# Patient Record
Sex: Female | Born: 1990
Health system: Southern US, Community
[De-identification: ages and names within clinical notes are randomized; demographics above are authoritative.]

## PROBLEM LIST (undated history)

## (undated) DIAGNOSIS — G43909 Migraine, unspecified, not intractable, without status migrainosus: Secondary | ICD-10-CM

## (undated) DIAGNOSIS — J45990 Exercise induced bronchospasm: Secondary | ICD-10-CM

## (undated) DIAGNOSIS — F32A Depression, unspecified: Secondary | ICD-10-CM

## (undated) DIAGNOSIS — E282 Polycystic ovarian syndrome: Secondary | ICD-10-CM

## (undated) DIAGNOSIS — F411 Generalized anxiety disorder: Secondary | ICD-10-CM

## (undated) DIAGNOSIS — F329 Major depressive disorder, single episode, unspecified: Secondary | ICD-10-CM

## (undated) DIAGNOSIS — E785 Hyperlipidemia, unspecified: Secondary | ICD-10-CM

## (undated) DIAGNOSIS — J309 Allergic rhinitis, unspecified: Secondary | ICD-10-CM

## (undated) DIAGNOSIS — F909 Attention-deficit hyperactivity disorder, unspecified type: Secondary | ICD-10-CM

## (undated) HISTORY — DX: Major depressive disorder, single episode, unspecified: F32.9

## (undated) HISTORY — DX: Exercise induced bronchospasm: J45.990

## (undated) HISTORY — DX: Polycystic ovarian syndrome: E28.2

## (undated) HISTORY — DX: Allergic rhinitis, unspecified: J30.9

## (undated) HISTORY — DX: Migraine, unspecified, not intractable, without status migrainosus: G43.909

## (undated) HISTORY — DX: Generalized anxiety disorder: F41.1

## (undated) HISTORY — DX: Depression, unspecified: F32.A

## (undated) HISTORY — DX: Hyperlipidemia, unspecified: E78.5

---
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
## 2009-04-04 HISTORY — PX: WISDOM TOOTH EXTRACTION: SHX21

## 2012-11-02 LAB — HM PAP SMEAR

## 2012-12-10 ENCOUNTER — Emergency Department (HOSPITAL_BASED_OUTPATIENT_CLINIC_OR_DEPARTMENT_OTHER)
Admission: EM | Admit: 2012-12-10 | Discharge: 2012-12-10 | Disposition: A | Discharge status: Home / Self Care | Payer: BC Managed Care – PPO | Attending: Emergency Medicine | Admitting: Emergency Medicine

## 2012-12-10 ENCOUNTER — Encounter (HOSPITAL_BASED_OUTPATIENT_CLINIC_OR_DEPARTMENT_OTHER): Payer: Self-pay

## 2012-12-10 DIAGNOSIS — W1809XA Striking against other object with subsequent fall, initial encounter: Secondary | ICD-10-CM | POA: Insufficient documentation

## 2012-12-10 DIAGNOSIS — IMO0002 Reserved for concepts with insufficient information to code with codable children: Secondary | ICD-10-CM

## 2012-12-10 DIAGNOSIS — Y9389 Activity, other specified: Secondary | ICD-10-CM | POA: Insufficient documentation

## 2012-12-10 DIAGNOSIS — S71109A Unspecified open wound, unspecified thigh, initial encounter: Secondary | ICD-10-CM | POA: Insufficient documentation

## 2012-12-10 DIAGNOSIS — S71009A Unspecified open wound, unspecified hip, initial encounter: Secondary | ICD-10-CM | POA: Insufficient documentation

## 2012-12-10 DIAGNOSIS — Z79899 Other long term (current) drug therapy: Secondary | ICD-10-CM | POA: Insufficient documentation

## 2012-12-10 DIAGNOSIS — S31809A Unspecified open wound of unspecified buttock, initial encounter: Secondary | ICD-10-CM | POA: Insufficient documentation

## 2012-12-10 DIAGNOSIS — Y9289 Other specified places as the place of occurrence of the external cause: Secondary | ICD-10-CM | POA: Insufficient documentation

## 2012-12-10 NOTE — ED Notes (Signed)
PA at bedside repairing wounds.

## 2012-12-10 NOTE — ED Notes (Signed)
Was sitting on glass coffee table that broke-reports multiple lacs

## 2012-12-10 NOTE — ED Provider Notes (Signed)
CSN: [id number]     Arrival date & time 12/10/12  2133 History   First MD Initiated Contact with Patient 12/10/12 2151     Chief Complaint  Patient presents with  . Extremity Laceration   (Consider location/radiation/quality/duration/timing/severity/associated sxs/prior Treatment) HPI Comments: Patient presents emergency department with chief complaint of fall through glass coffee table. She is complaining of several lacerations to her upper left thigh. He states that the pain is moderate. Bleeding is controlled. Her tetanus is up-to-date. She has not tried taking anything to alleviate her symptoms. Denies any difficulty moving her legs or ambulating.  The history is provided by the patient. No language interpreter was used.    History reviewed. No pertinent past medical history. History reviewed. No pertinent past surgical history. No family history on file. History  Substance Use Topics  . Smoking status: Never Smoker   . Smokeless tobacco: Not on file  . Alcohol Use: No   OB History   Grav Para Term Preterm Abortions TAB SAB Ect Mult Living                 Review of Systems  All other systems reviewed and are negative.    Allergies  Review of patient's allergies indicates no known allergies.  Home Medications   Current Outpatient Rx  Name  Route  Sig  Dispense  Refill  . LamoTRIgine (LAMICTAL PO)   Oral   Take by mouth.         . Sertraline HCl (ZOLOFT PO)   Oral   Take by mouth.          BP 130/82  Pulse 66  Temp(Src) 98.7 F (37.1 C) (Oral)  Resp 16  Ht 5\' 10"  (1.778 m)  Wt 185 lb (83.915 kg)  BMI 26.54 kg/m2  SpO2 99%  LMP 11/26/2012 Physical Exam  Nursing note and vitals reviewed. Constitutional: She is oriented to person, place, and time. She appears well-developed and well-nourished.  HENT:  Head: Normocephalic and atraumatic.  Eyes: Conjunctivae and EOM are normal. Pupils are equal, round, and reactive to light.  Neck: Normal range of  motion. Neck supple.  Cardiovascular: Normal rate and regular rhythm.  Exam reveals no gallop and no friction rub.   No murmur heard. Pulmonary/Chest: Effort normal and breath sounds normal. No respiratory distress. She has no wheezes. She has no rales. She exhibits no tenderness.  Abdominal: Soft. Bowel sounds are normal. She exhibits no distension and no mass. There is no tenderness. There is no rebound and no guarding.  Musculoskeletal: Normal range of motion. She exhibits no edema and no tenderness.  Neurological: She is alert and oriented to person, place, and time.  Skin: Skin is warm and dry.  Several scratches on the posterior bilateral thighs, as well as a scratch on the right lateral breast, (3) 1 cm lacerations to the posterior left thigh  Psychiatric: She has a normal mood and affect. Her behavior is normal. Judgment and thought content normal.    ED Course  Procedures (including critical care time) Labs Review Labs Reviewed - No data to display Imaging Review No results found. LACERATION REPAIR Performed by: Roxy Horseman Authorized by: Roxy Horseman Consent: Verbal consent obtained. Risks and benefits: risks, benefits and alternatives were discussed Consent given by: patient Patient identity confirmed: provided demographic data Prepped and Draped in normal sterile fashion Wound explored  Laceration Location: Posterior left thigh  Laceration Length: 1 cm  No Foreign Bodies seen or palpated  Anesthesia:  local infiltration  Local anesthetic: lidocaine 2 % with epinephrine  Anesthetic total: 3 ml  Irrigation method: syringe Amount of cleaning: standard  Skin closure: 4-0 Prolene   Number of sutures: 2   Technique: Simple   Patient tolerance: Patient tolerated the procedure well with no immediate complications.  LACERATION REPAIR Performed by: Roxy Horseman Authorized by: Roxy Horseman Consent: Verbal consent obtained. Risks and benefits:  risks, benefits and alternatives were discussed Consent given by: patient Patient identity confirmed: provided demographic data Prepped and Draped in normal sterile fashion Wound explored  Laceration Location: Posterior left thigh  Laceration Length: 1 cm  No Foreign Bodies seen or palpated  Anesthesia: local infiltration  Local anesthetic: lidocaine 2 % with epinephrine  Anesthetic total: 3 ml  Irrigation method: syringe Amount of cleaning: standard  Skin closure: 4-0 Prolene   Number of sutures: 2   Technique: Simple   Patient tolerance: Patient tolerated the procedure well with no immediate complications.  LACERATION REPAIR Performed by: Roxy Horseman Authorized by: Roxy Horseman Consent: Verbal consent obtained. Risks and benefits: risks, benefits and alternatives were discussed Consent given by: patient Patient identity confirmed: provided demographic data Prepped and Draped in normal sterile fashion Wound explored  Laceration Location: Posterior left buttock  Laceration Length: 1 cm  No Foreign Bodies seen or palpated  Anesthesia: local infiltration  Local anesthetic: lidocaine 2 % with epinephrine  Anesthetic total: 3 ml  Irrigation method: syringe Amount of cleaning: standard  Skin closure: 4-0 Prolene   Number of sutures: 2   Technique: Simple   Patient tolerance: Patient tolerated the procedure well with no immediate complications.  MDM   1. Laceration    Patient fell through a glass coffee table that she was sitting on. She had multiple small lacerations that were repaired in the ED. Bleeding is controlled. Tetanus is up-to-date. Foreign bodies were removed. No obvious foreign bodies remained. Followup and return precautions have been given. Patient is stable and ready for discharge.    Roxy Horseman, PA-C 12/10/12 2254

## 2012-12-10 NOTE — ED Provider Notes (Signed)
  Medical screening examination/treatment/procedure(s) were performed by non-physician practitioner and as supervising physician I was immediately available for consultation/collaboration.    Aliviyah Malanga, MD 12/10/12 2319 

## 2012-12-10 NOTE — ED Notes (Signed)
Wound care provided, wounds dressed.

## 2012-12-18 ENCOUNTER — Telehealth: Payer: Self-pay | Admitting: Internal Medicine

## 2012-12-18 NOTE — Telephone Encounter (Signed)
sure

## 2012-12-18 NOTE — Telephone Encounter (Signed)
Pt is schedule to be Dr. Lescher new pt in 05/20/13. Pt request to be work in before 01/12/13 due to insurance will be terminate. Please advise ° °

## 2012-12-18 NOTE — Telephone Encounter (Signed)
Pt called stated that she fell and have 6 stitches put on her leg(different places) last Monday 12/10/12, pt was advise to get it remove 9-10 days. Pt is schedule for new pt with Dr. Felicity Coyer in February 2015. Pt request to be work in to get this stitches remove before new pt. Inform pt that Dr. Felicity Coyer is not in the office this week, not sure if other doctor feel comfortable to do this. Please advise.

## 2012-12-21 ENCOUNTER — Ambulatory Visit (INDEPENDENT_AMBULATORY_CARE_PROVIDER_SITE_OTHER): Payer: BC Managed Care – PPO | Admitting: Internal Medicine

## 2012-12-21 ENCOUNTER — Encounter: Payer: Self-pay | Admitting: Internal Medicine

## 2012-12-21 VITALS — BP 118/70 | HR 80 | Temp 98.4°F | Resp 12 | Wt 185.0 lb

## 2012-12-21 DIAGNOSIS — Z5189 Encounter for other specified aftercare: Secondary | ICD-10-CM

## 2012-12-21 DIAGNOSIS — T148XXD Other injury of unspecified body region, subsequent encounter: Secondary | ICD-10-CM | POA: Insufficient documentation

## 2012-12-21 DIAGNOSIS — IMO0002 Reserved for concepts with insufficient information to code with codable children: Secondary | ICD-10-CM | POA: Insufficient documentation

## 2012-12-21 NOTE — Progress Notes (Signed)
  Subjective:    Patient ID: Felicia Young, female    DOB: 1991-03-20, 22 y.o.   MRN: [id number]  Wound Check She was originally treated 10 to 14 days ago. Previous treatment included laceration repair. Her temperature was unmeasured prior to arrival. There has been no drainage from the wound. There is no redness present. There is no swelling present. The pain has no pain. She has no difficulty moving the affected extremity or digit.      Review of Systems  All other systems reviewed and are negative.       Objective:   Physical Exam  Skin:             Assessment & Plan:

## 2012-12-21 NOTE — Telephone Encounter (Signed)
OV 9/19 noted - thanks Dr Yetta Barre!

## 2012-12-21 NOTE — Patient Instructions (Signed)
Wound Care Wound care helps prevent pain and infection.  You may need a tetanus shot if:  You cannot remember when you had your last tetanus shot.  You have never had a tetanus shot.  The injury broke your skin. If you need a tetanus shot and you choose not to have one, you may get tetanus. Sickness from tetanus can be serious. HOME CARE   Only take medicine as told by your doctor.  Clean the wound daily with mild soap and water.  Change any bandages (dressings) as told by your doctor.  Put medicated cream and a bandage on the wound as told by your doctor.  Change the bandage if it gets wet, dirty, or starts to smell.  Take showers. Do not take baths, swim, or do anything that puts your wound under water.  Rest and raise (elevate) the wound until the pain and puffiness (swelling) are better.  Keep all doctor visits as told. GET HELP RIGHT AWAY IF:   Yellowish-white fluid (pus) comes from the wound.  Medicine does not lessen your pain.  There is a red streak going away from the wound.  You have a fever. MAKE SURE YOU:   Understand these instructions.  Will watch your condition.  Will get help right away if you are not doing well or get worse. Document Released: 12/29/2007 Document Revised: 06/13/2011 Document Reviewed: 07/25/2010 [employer] Patient Information 2014 [employer].  

## 2012-12-24 ENCOUNTER — Telehealth: Payer: Self-pay | Admitting: Internal Medicine

## 2012-12-24 NOTE — Telephone Encounter (Signed)
Pt is schedule to be Dr. Annye Rusk new pt in 05/20/13. Pt request to be work in before 01/12/13 due to insurance will be terminate. Please advise

## 2012-12-24 NOTE — Telephone Encounter (Signed)
Ok to work in- thanks

## 2012-12-24 NOTE — Telephone Encounter (Signed)
Pt has an appt 01/10/13. Thank you

## 2013-01-10 ENCOUNTER — Other Ambulatory Visit (INDEPENDENT_AMBULATORY_CARE_PROVIDER_SITE_OTHER): Payer: BC Managed Care – PPO

## 2013-01-10 ENCOUNTER — Encounter: Payer: Self-pay | Admitting: Internal Medicine

## 2013-01-10 ENCOUNTER — Ambulatory Visit (INDEPENDENT_AMBULATORY_CARE_PROVIDER_SITE_OTHER): Payer: BC Managed Care – PPO | Admitting: Internal Medicine

## 2013-01-10 VITALS — BP 118/78 | HR 86 | Temp 98.2°F | Ht 70.0 in | Wt 189.8 lb

## 2013-01-10 DIAGNOSIS — Z0000 Encounter for general adult medical examination without abnormal findings: Secondary | ICD-10-CM

## 2013-01-10 DIAGNOSIS — R7989 Other specified abnormal findings of blood chemistry: Secondary | ICD-10-CM

## 2013-01-10 DIAGNOSIS — G43909 Migraine, unspecified, not intractable, without status migrainosus: Secondary | ICD-10-CM | POA: Insufficient documentation

## 2013-01-10 DIAGNOSIS — J45990 Exercise induced bronchospasm: Secondary | ICD-10-CM | POA: Insufficient documentation

## 2013-01-10 LAB — URINALYSIS, ROUTINE W REFLEX MICROSCOPIC
Ketones, ur: NEGATIVE
Leukocytes, UA: NEGATIVE
Nitrite: NEGATIVE
RBC / HPF: NONE SEEN (ref 0–?)
Specific Gravity, Urine: 1.01 (ref 1.000–1.030)
pH: 7 (ref 5.0–8.0)

## 2013-01-10 LAB — LIPID PANEL
Cholesterol: 248 mg/dL — ABNORMAL HIGH (ref 0–200)
HDL: 49.9 mg/dL (ref >39.00)
Triglycerides: 272 mg/dL — ABNORMAL HIGH (ref 0.0–149.0)
VLDL: 54.4 mg/dL — ABNORMAL HIGH (ref 0.0–40.0)

## 2013-01-10 LAB — BASIC METABOLIC PANEL
Calcium: 9.8 mg/dL (ref 8.4–10.5)
GFR: 131.8 mL/min (ref >60.00)
Potassium: 4.6 mEq/L (ref 3.5–5.1)
Sodium: 141 mEq/L (ref 135–145)

## 2013-01-10 LAB — HEPATIC FUNCTION PANEL
ALT: 25 U/L (ref 0–35)
Alkaline Phosphatase: 50 U/L (ref 39–117)
Total Bilirubin: 0.8 mg/dL (ref 0.3–1.2)
Total Protein: 7.8 g/dL (ref 6.0–8.3)

## 2013-01-10 LAB — CBC WITH DIFFERENTIAL/PLATELET
Eosinophils Absolute: 0 10*3/uL (ref 0.0–0.7)
Eosinophils Relative: 0.5 % (ref 0.0–5.0)
HCT: 40.8 % (ref 36.0–46.0)
Hemoglobin: 13.9 g/dL (ref 12.0–15.0)
Lymphocytes Relative: 33.3 % (ref 12.0–46.0)
Lymphs Abs: 2.2 10*3/uL (ref 0.7–4.0)
MCHC: 34.1 g/dL (ref 30.0–36.0)
Monocytes Relative: 9.1 % (ref 3.0–12.0)
Neutrophils Relative %: 56.8 % (ref 43.0–77.0)
Platelets: 262 10*3/uL (ref 150.0–400.0)
WBC: 6.8 10*3/uL (ref 4.5–10.5)

## 2013-01-10 LAB — TSH: TSH: 2.88 u[IU]/mL (ref 0.35–5.50)

## 2013-01-10 MED ORDER — ALBUTEROL SULFATE HFA 108 (90 BASE) MCG/ACT IN AERS: 2.0000 | INHALATION_SPRAY | Freq: Four times a day (QID) | RESPIRATORY_TRACT | Status: DC | PRN

## 2013-01-10 NOTE — Progress Notes (Signed)
  Subjective:    Patient ID: Felicia Young, female    DOB: [birth year], 22 y.o.   MRN: [id number]  HPI  patient is here today for annual physical. Patient feels well overall   Past Medical History  Diagnosis Date  . Generalized anxiety disorder   . ALLERGIC RHINITIS     seasonal  . Exercise-induced asthma    Family History  Problem Relation Age of Onset  . Arthritis Mother   . Arthritis Father   . Alcohol abuse Other   . Hyperlipidemia Other   . Hypertension Other   . Stroke Other    History  Substance Use Topics  . Smoking status: Never Smoker   . Smokeless tobacco: Not on file  . Alcohol Use: No    Review of Systems  Constitutional: Negative for fever or weight change.  Respiratory: Negative for cough and shortness of breath.   Cardiovascular: Negative for chest pain or palpitations.  Gastrointestinal: Negative for abdominal pain, no bowel changes.  Musculoskeletal: Negative for gait problem or joint swelling.  Skin: Negative for rash.  Neurological: Negative for dizziness or headache.  No other specific complaints in a complete review of systems (except as listed in HPI above).     Objective:   Physical Exam BP 118/78  Pulse 86  Temp(Src) 98.2 F (36.8 C) (Oral)  Ht 5\' 10"  (1.778 m)  Wt 189 lb 12.8 oz (86.093 kg)  BMI 27.23 kg/m2  SpO2 97% Wt Readings from Last 3 Encounters:  01/10/13 189 lb 12.8 oz (86.093 kg)  12/21/12 185 lb (83.915 kg)  12/10/12 185 lb (83.915 kg)   Constitutional: She is overweight, but appears well-developed and well-nourished. No distress.  HENT: Head: Normocephalic and atraumatic. Ears: B TMs ok, no erythema or effusion; Nose: Nose normal. Mouth/Throat: Oropharynx is clear and moist. No oropharyngeal exudate.  Eyes: Conjunctivae and EOM are normal. Pupils are equal, round, and reactive to light. No scleral icterus.  Neck: Normal range of motion. Neck supple. No JVD present. No thyromegaly present.  Cardiovascular: Normal rate,  regular rhythm and normal heart sounds.  No murmur heard. No BLE edema. Pulmonary/Chest: Effort normal and breath sounds normal. No respiratory distress. She has no wheezes.  Abdominal: Soft. Bowel sounds are normal. She exhibits no distension. There is no tenderness. no masses Musculoskeletal: Normal range of motion, no joint effusions. No gross deformities Neurological: She is alert and oriented to person, place, and time. No cranial nerve deficit. Coordination, balance, strength, speech and gait are normal.  Skin: Various tattoos - but skin is warm and dry. No rash noted. No erythema.  Psychiatric: She has a normal mood and affect. Her behavior is normal. Judgment and thought content normal.   Lab Results  Component Value Date   WBC 9.4 07/30/2008   HGB 14.0 07/30/2008   HCT 41.1 07/30/2008   PLT 175.0 07/30/2008   GLUCOSE 82 07/30/2008   ALT 18 07/30/2008   AST 18 07/30/2008   NA 140 07/30/2008   K 5.0 07/30/2008   CL 103 07/30/2008   CREATININE 0.6 07/30/2008   BUN 10 07/30/2008   CO2 30 07/30/2008   TSH 2.30 07/30/2008       Assessment & Plan:   CPX/v70.0 - Patient has been counseled on age-appropriate routine health concerns for screening and prevention. These are reviewed and up-to-date. Immunizations are up-to-date or declined. Labs ordered and reviewed.

## 2013-01-10 NOTE — Patient Instructions (Addendum)
It was good to see you today.  We have reviewed your prior records including labs and tests today  Health Maintenance reviewed - all recommended immunizations and age-appropriate screenings are up-to-date.  It is recommended to get a flu shot every fall, especially with your history of asthma  Medications reviewed and updated, resume albuterol rescue inhaler as needed for exercise-induced asthma -no other changes recommended  Your prescription(s) have been submitted to your pharmacy. Please take as directed and contact our office if you believe you are having problem(s) with the medication(s).  Test(s) ordered today. Your results will be released to MyChart (or called to you) after review, usually within 72hours after test completion. If any changes need to be made, you will be notified at that same time.  Please schedule followup in 12 months for annual medical physical, call sooner if problems.  Health Maintenance, Females A healthy lifestyle and preventative care can promote health and wellness.  Maintain regular health, dental, and eye exams.  Eat a healthy diet. Foods like vegetables, fruits, whole grains, low-fat dairy products, and lean protein foods contain the nutrients you need without too many calories. Decrease your intake of foods high in solid fats, added sugars, and salt. Get information about a proper diet from your caregiver, if necessary.  Regular physical exercise is one of the most important things you can do for your health. Most adults should get at least 150 minutes of moderate-intensity exercise (any activity that increases your heart rate and causes you to sweat) each week. In addition, most adults need muscle-strengthening exercises on 2 or more days a week.   Maintain a healthy weight. The body mass index (BMI) is a screening tool to identify possible weight problems. It provides an estimate of body fat based on height and weight. Your caregiver can help determine  your BMI, and can help you achieve or maintain a healthy weight. For adults 20 years and older:  A BMI below 18.5 is considered underweight.  A BMI of 18.5 to 24.9 is normal.  A BMI of 25 to 29.9 is considered overweight.  A BMI of 30 and above is considered obese.  Maintain normal blood lipids and cholesterol by exercising and minimizing your intake of saturated fat. Eat a balanced diet with plenty of fruits and vegetables. Blood tests for lipids and cholesterol should begin at age 39 and be repeated every 5 years. If your lipid or cholesterol levels are high, you are over 50, or you are a high risk for heart disease, you may need your cholesterol levels checked more frequently.Ongoing high lipid and cholesterol levels should be treated with medicines if diet and exercise are not effective.  If you smoke, find out from your caregiver how to quit. If you do not use tobacco, do not start.  If you are pregnant, do not drink alcohol. If you are breastfeeding, be very cautious about drinking alcohol. If you are not pregnant and choose to drink alcohol, do not exceed 1 drink per day. One drink is considered to be 12 ounces (355 mL) of beer, 5 ounces (148 mL) of wine, or 1.5 ounces (44 mL) of liquor.  Avoid use of street drugs. Do not share needles with anyone. Ask for help if you need support or instructions about stopping the use of drugs.  High blood pressure causes heart disease and increases the risk of stroke. Blood pressure should be checked at least every 1 to 2 years. Ongoing high blood pressure should be  treated with medicines, if weight loss and exercise are not effective.  If you are 58 to 23 years old, ask your caregiver if you should take aspirin to prevent strokes.  Diabetes screening involves taking a blood sample to check your fasting blood sugar level. This should be done once every 3 years, after age 61, if you are within normal weight and without risk factors for diabetes.  Testing should be considered at a younger age or be carried out more frequently if you are overweight and have at least 1 risk factor for diabetes.  Breast cancer screening is essential preventative care for women. You should practice "breast self-awareness." This means understanding the normal appearance and feel of your breasts and may include breast self-examination. Any changes detected, no matter how small, should be reported to a caregiver. Women in their 96s and 30s should have a clinical breast exam (CBE) by a caregiver as part of a regular health exam every 1 to 3 years. After age 44, women should have a CBE every year. Starting at age 53, women should consider having a mammogram (breast X-ray) every year. Women who have a family history of breast cancer should talk to their caregiver about genetic screening. Women at a high risk of breast cancer should talk to their caregiver about having an MRI and a mammogram every year.  The Pap test is a screening test for cervical cancer. Women should have a Pap test starting at age 68. Between ages 57 and 60, Pap tests should be repeated every 2 years. Beginning at age 63, you should have a Pap test every 3 years as long as the past 3 Pap tests have been normal. If you had a hysterectomy for a problem that was not cancer or a condition that could lead to cancer, then you no longer need Pap tests. If you are between ages 37 and 73, and you have had normal Pap tests going back 10 years, you no longer need Pap tests. If you have had past treatment for cervical cancer or a condition that could lead to cancer, you need Pap tests and screening for cancer for at least 20 years after your treatment. If Pap tests have been discontinued, risk factors (such as a new sexual partner) need to be reassessed to determine if screening should be resumed. Some women have medical problems that increase the chance of getting cervical cancer. In these cases, your caregiver may  recommend more frequent screening and Pap tests.  The human papillomavirus (HPV) test is an additional test that may be used for cervical cancer screening. The HPV test looks for the virus that can cause the cell changes on the cervix. The cells collected during the Pap test can be tested for HPV. The HPV test could be used to screen women aged 49 years and older, and should be used in women of any age who have unclear Pap test results. After the age of 52, women should have HPV testing at the same frequency as a Pap test.  Colorectal cancer can be detected and often prevented. Most routine colorectal cancer screening begins at the age of 57 and continues through age 85. However, your caregiver may recommend screening at an earlier age if you have risk factors for colon cancer. On a yearly basis, your caregiver may provide home test kits to check for hidden blood in the stool. Use of a small camera at the end of a tube, to directly examine the colon (sigmoidoscopy or  colonoscopy), can detect the earliest forms of colorectal cancer. Talk to your caregiver about this at age 83, when routine screening begins. Direct examination of the colon should be repeated every 5 to 10 years through age 64, unless early forms of pre-cancerous polyps or small growths are found.  Hepatitis C blood testing is recommended for all people born from [birth year] through 1966 and any individual with known risks for hepatitis C.  Practice safe sex. Use condoms and avoid high-risk sexual practices to reduce the spread of sexually transmitted infections (STIs). Sexually active women aged 17 and younger should be checked for Chlamydia, which is a common sexually transmitted infection. Older women with new or multiple partners should also be tested for Chlamydia. Testing for other STIs is recommended if you are sexually active and at increased risk.  Osteoporosis is a disease in which the bones lose minerals and strength with aging. This can  result in serious bone fractures. The risk of osteoporosis can be identified using a bone density scan. Women ages 84 and over and women at risk for fractures or osteoporosis should discuss screening with their caregivers. Ask your caregiver whether you should be taking a calcium supplement or vitamin D to reduce the rate of osteoporosis.  Menopause can be associated with physical symptoms and risks. Hormone replacement therapy is available to decrease symptoms and risks. You should talk to your caregiver about whether hormone replacement therapy is right for you.  Use sunscreen with a sun protection factor (SPF) of 30 or greater. Apply sunscreen liberally and repeatedly throughout the day. You should seek shade when your shadow is shorter than you. Protect yourself by wearing long sleeves, pants, a wide-brimmed hat, and sunglasses year round, whenever you are outdoors.  Notify your caregiver of new moles or changes in moles, especially if there is a change in shape or color. Also notify your caregiver if a mole is larger than the size of a pencil eraser.  Stay current with your immunizations. Document Released: 10/04/2010 Document Revised: 06/13/2011 Document Reviewed: 10/04/2010 [facility] Patient Information 2014 [place], [place].

## 2013-01-11 ENCOUNTER — Encounter: Payer: Self-pay | Admitting: Internal Medicine

## 2013-01-11 DIAGNOSIS — E785 Hyperlipidemia, unspecified: Secondary | ICD-10-CM

## 2013-01-11 HISTORY — DX: Hyperlipidemia, unspecified: E78.5

## 2013-03-11 LAB — OB RESULTS CONSOLE RPR: RPR: NONREACTIVE

## 2013-03-11 LAB — OB RESULTS CONSOLE ANTIBODY SCREEN: Antibody Screen: NEGATIVE

## 2013-03-11 LAB — OB RESULTS CONSOLE GC/CHLAMYDIA
Chlamydia: NEGATIVE
Gonorrhea: NEGATIVE

## 2013-03-11 LAB — OB RESULTS CONSOLE HEPATITIS B SURFACE ANTIGEN: HEP B S AG: NEGATIVE

## 2013-03-11 LAB — OB RESULTS CONSOLE ABO/RH: RH TYPE: POSITIVE

## 2013-03-11 LAB — OB RESULTS CONSOLE HIV ANTIBODY (ROUTINE TESTING): HIV: NONREACTIVE

## 2013-03-11 LAB — OB RESULTS CONSOLE RUBELLA ANTIBODY, IGM: Rubella: IMMUNE

## 2013-04-14 ENCOUNTER — Emergency Department (HOSPITAL_COMMUNITY)
Admission: EM | Admit: 2013-04-14 | Discharge: 2013-04-14 | Disposition: A | Discharge status: Home / Self Care | Payer: Medicaid Other | Attending: Emergency Medicine | Admitting: Emergency Medicine

## 2013-04-14 ENCOUNTER — Encounter (HOSPITAL_COMMUNITY): Payer: Self-pay | Admitting: Emergency Medicine

## 2013-04-14 DIAGNOSIS — O9989 Other specified diseases and conditions complicating pregnancy, childbirth and the puerperium: Secondary | ICD-10-CM | POA: Insufficient documentation

## 2013-04-14 DIAGNOSIS — Z8639 Personal history of other endocrine, nutritional and metabolic disease: Secondary | ICD-10-CM | POA: Insufficient documentation

## 2013-04-14 DIAGNOSIS — Z79899 Other long term (current) drug therapy: Secondary | ICD-10-CM | POA: Insufficient documentation

## 2013-04-14 DIAGNOSIS — F411 Generalized anxiety disorder: Secondary | ICD-10-CM | POA: Insufficient documentation

## 2013-04-14 DIAGNOSIS — J45990 Exercise induced bronchospasm: Secondary | ICD-10-CM | POA: Insufficient documentation

## 2013-04-14 DIAGNOSIS — O209 Hemorrhage in early pregnancy, unspecified: Secondary | ICD-10-CM | POA: Insufficient documentation

## 2013-04-14 DIAGNOSIS — N939 Abnormal uterine and vaginal bleeding, unspecified: Secondary | ICD-10-CM

## 2013-04-14 DIAGNOSIS — Z862 Personal history of diseases of the blood and blood-forming organs and certain disorders involving the immune mechanism: Secondary | ICD-10-CM | POA: Insufficient documentation

## 2013-04-14 DIAGNOSIS — Z3490 Encounter for supervision of normal pregnancy, unspecified, unspecified trimester: Secondary | ICD-10-CM

## 2013-04-14 DIAGNOSIS — G43909 Migraine, unspecified, not intractable, without status migrainosus: Secondary | ICD-10-CM | POA: Insufficient documentation

## 2013-04-14 DIAGNOSIS — O99340 Other mental disorders complicating pregnancy, unspecified trimester: Secondary | ICD-10-CM | POA: Insufficient documentation

## 2013-04-14 LAB — CBC
HEMATOCRIT: 37.8 % (ref 36.0–46.0)
HEMOGLOBIN: 13.3 g/dL (ref 12.0–15.0)
MCH: 30.8 pg (ref 26.0–34.0)
MCHC: 35.2 g/dL (ref 30.0–36.0)
MCV: 87.5 fL (ref 78.0–100.0)
Platelets: 259 10*3/uL (ref 150–400)
RBC: 4.32 MIL/uL (ref 3.87–5.11)
RDW: 12.4 % (ref 11.5–15.5)
WBC: 10.9 10*3/uL — ABNORMAL HIGH (ref 4.0–10.5)

## 2013-04-14 LAB — HCG, QUANTITATIVE, PREGNANCY: HCG, BETA CHAIN, QUANT, S: 67380 m[IU]/mL — AB (ref <5)

## 2013-04-14 LAB — POCT PREGNANCY, URINE: PREG TEST UR: POSITIVE — AB

## 2013-04-14 LAB — URINALYSIS, ROUTINE W REFLEX MICROSCOPIC
Bilirubin Urine: NEGATIVE
Glucose, UA: NEGATIVE mg/dL
Ketones, ur: NEGATIVE mg/dL
Leukocytes, UA: NEGATIVE
Nitrite: NEGATIVE
PROTEIN: NEGATIVE mg/dL
SPECIFIC GRAVITY, URINE: 1.029 (ref 1.005–1.030)
UROBILINOGEN UA: 0.2 mg/dL (ref 0.0–1.0)
pH: 5.5 (ref 5.0–8.0)

## 2013-04-14 LAB — URINE MICROSCOPIC-ADD ON

## 2013-04-14 LAB — WET PREP, GENITAL
Clue Cells Wet Prep HPF POC: NONE SEEN
Trich, Wet Prep: NONE SEEN
Yeast Wet Prep HPF POC: NONE SEEN

## 2013-04-14 LAB — ABO/RH: ABO/RH(D): O POS

## 2013-04-14 NOTE — ED Provider Notes (Signed)
CSN: [id number]     Arrival date & time 04/14/13  1128 History   First MD Initiated Contact with Patient 04/14/13 1153     Chief Complaint  Patient presents with  . [redacted] wks pregnant   . Vaginal Bleeding   (Consider location/radiation/quality/duration/timing/severity/associated sxs/prior Treatment) HPI Comments: 23 yo female with migraine, lipids hx presents with spotting during pregnancy. Pt is 13 wks, has had formal US showing intrauterine preg and this morning had very small amount of blood that resolved.  No issues with pregnancy, first one, no blood thinners, pain or fevers.  No fertility agents or ectopic hx.  No new sex partners.  Chlamydia years ago.   Patient is a 23 y.o. female presenting with vaginal bleeding. The history is provided by the patient.  Vaginal Bleeding Associated symptoms: no abdominal pain, no back pain, no dysuria and no fever     Past Medical History  Diagnosis Date  . Generalized anxiety disorder   . ALLERGIC RHINITIS     seasonal  . Exercise-induced asthma   . Migraine   . Dyslipidemia 01/11/2013    CPX 01/2013 dx - Recommend diet/aerobic exercise changes with weight reduction   Past Surgical History  Procedure Laterality Date  . Wisdom tooth extraction  2011  . Orif radial fracture Left 1999   Family History  Problem Relation Age of Onset  . Arthritis Mother   . Arthritis Father   . Alcohol abuse Other   . Hyperlipidemia Other   . Hypertension Other   . Stroke Other    History  Substance Use Topics  . Smoking status: Never Smoker   . Smokeless tobacco: Not on file  . Alcohol Use: No   OB History   Grav Para Term Preterm Abortions TAB SAB Ect Mult Living                 Review of Systems  Constitutional: Negative for fever and chills.  HENT: Negative for congestion.   Respiratory: Negative for shortness of breath.   Cardiovascular: Negative for chest pain.  Gastrointestinal: Negative for vomiting and abdominal pain.  Genitourinary:  Positive for vaginal bleeding. Negative for dysuria and flank pain.  Musculoskeletal: Negative for back pain, neck pain and neck stiffness.  Skin: Negative for rash.  Neurological: Negative for light-headedness and headaches.    Allergies  Review of patient's allergies indicates no known allergies.  Home Medications   Current Outpatient Rx  Name  Route  Sig  Dispense  Refill  . lamoTRIgine (LAMICTAL) 100 MG tablet   Oral   Take 100 mg by mouth daily.         . prenatal vitamin w/FE, FA (PRENATAL 1 + 1) 27-1 MG TABS tablet   Oral   Take 1 tablet by mouth daily at 12 noon.         . sertraline (ZOLOFT) 100 MG tablet   Oral   Take 50 mg by mouth daily.          Marland Kitchen albuterol (PROVENTIL HFA;VENTOLIN HFA) 108 (90 BASE) MCG/ACT inhaler   Inhalation   Inhale 2 puffs into the lungs every 6 (six) hours as needed for wheezing.   1 Inhaler   0    BP 142/77  Pulse 117  Temp(Src) 97.8 F (36.6 C) (Oral)  Resp 17  SpO2 96%  LMP 12/28/2012 Physical Exam  Nursing note and vitals reviewed. Constitutional: She is oriented to person, place, and time. She appears well-developed and well-nourished.  HENT:  Head: Normocephalic and atraumatic.  Eyes: Conjunctivae are normal. Right eye exhibits no discharge. Left eye exhibits no discharge.  Neck: Normal range of motion. Neck supple. No tracheal deviation present.  Cardiovascular: Normal rate and regular rhythm.   Pulmonary/Chest: Effort normal and breath sounds normal.  Abdominal: Soft. She exhibits no distension. There is no tenderness. There is no guarding.  Genitourinary:  Mild white discharge on pelvic, os closed, no bleeding, no CMT  Musculoskeletal: She exhibits no edema.  Neurological: She is alert and oriented to person, place, and time.  Skin: Skin is warm. No rash noted.  Psychiatric: She has a normal mood and affect.    ED Course  Procedures (including critical care time) EMERGENCY DEPARTMENT US PREGNANCY "Study:  Limited Ultrasound of the Pelvis for Pregnancy"  INDICATIONS:Pregnancy(required) bleeding Multiple views of the uterus and pelvic cavity were obtained in real-time with a multi-frequency probe. 33 APPROACH: transabdominal 3 PERFORMED BY: Myself  IMAGES ARCHIVED?: Yes  LIMITATIONS:none  PREGNANCY FREE FLUID: None  PREGNANCY FINDINGS: Intrauterine gestational sac noted and Fetal heart activity seen  INTERPRETATION: Viable intrauterine pregnancy   FETAL HEART RATE: 154     Labs Review Labs Reviewed  WET PREP, GENITAL - Abnormal; Notable for the following:    WBC, Wet Prep HPF POC FEW (*)    All other components within normal limits  CBC - Abnormal; Notable for the following:    WBC 10.9 (*)    All other components within normal limits  HCG, QUANTITATIVE, PREGNANCY - Abnormal; Notable for the following:    hCG, Beta Chain, Quant, [place] [id number] (*)    All other components within normal limits  URINALYSIS, ROUTINE W REFLEX MICROSCOPIC - Abnormal; Notable for the following:    APPearance CLOUDY (*)    Hgb urine dipstick TRACE (*)    All other components within normal limits  URINE MICROSCOPIC-ADD ON - Abnormal; Notable for the following:    Bacteria, UA FEW (*)    All other components within normal limits  POCT PREGNANCY, URINE - Abnormal; Notable for the following:    Preg Test, Ur POSITIVE (*)    All other components within normal limits  GC/CHLAMYDIA PROBE AMP  ABO/RH   Imaging Review No results found.  EKG Interpretation   None       MDM   1. Vaginal bleeding   2. Pregnancy    Well appearing. Pt blood type per her is O pos. US done by myself showed live intrauterine preg. HCG sent to [facility], machine down, OB to fup outpt.  Close fup OB discussed.  Results and differential diagnosis were discussed with the patient. Close follow up outpatient was discussed, patient comfortable with the plan.         Enid SkeensJoshua M Cyd Hostler, MD 04/14/13 [phone]

## 2013-04-14 NOTE — Discharge Instructions (Signed)
See your OB in 2-3 days for recheck and HCG.  If you were given medicines take as directed.  If you are on coumadin or contraceptives realize their levels and effectiveness is altered by many different medicines.  If you have any reaction (rash, tongues swelling, other) to the medicines stop taking and see a physician.   Please follow up as directed and return to the ER or see a physician for new or worsening symptoms.  Thank you.

## 2013-04-14 NOTE — ED Notes (Signed)
Pt states that she is [redacted] wks pregnant and this morning she used the bathroom and saw "light pink" on the toilet paper.  Denies pain.

## 2013-04-14 NOTE — ED Notes (Signed)
Pt, states normal pregnancy so far-has US last week and everything was normal-no abdominal pain/cramping-no N/V

## 2013-04-15 LAB — GC/CHLAMYDIA PROBE AMP
CT Probe RNA: NEGATIVE
GC Probe RNA: NEGATIVE

## 2013-05-20 ENCOUNTER — Ambulatory Visit: Payer: BC Managed Care – PPO | Admitting: Internal Medicine

## 2013-09-11 LAB — OB RESULTS CONSOLE GBS: GBS: NEGATIVE

## 2013-10-16 ENCOUNTER — Inpatient Hospital Stay (HOSPITAL_COMMUNITY)
Admission: AD | Admit: 2013-10-16 | Payer: Medicaid Other | Source: Ambulatory Visit | Admitting: Obstetrics and Gynecology

## 2013-10-18 ENCOUNTER — Encounter (HOSPITAL_COMMUNITY): Payer: Self-pay | Admitting: *Deleted

## 2013-10-18 ENCOUNTER — Telehealth (HOSPITAL_COMMUNITY): Payer: Self-pay | Admitting: *Deleted

## 2013-10-18 NOTE — Telephone Encounter (Signed)
Preadmission screen  

## 2013-10-22 ENCOUNTER — Encounter (HOSPITAL_COMMUNITY): Payer: Self-pay | Admitting: *Deleted

## 2013-10-22 ENCOUNTER — Inpatient Hospital Stay (HOSPITAL_COMMUNITY)
Admission: RE | Admit: 2013-10-22 | Discharge: 2013-10-26 | DRG: 766 | Disposition: A | Discharge status: Home / Self Care | Payer: Medicaid Other | Source: Ambulatory Visit | Attending: Obstetrics and Gynecology | Admitting: Obstetrics and Gynecology

## 2013-10-22 ENCOUNTER — Encounter (HOSPITAL_COMMUNITY): Payer: Self-pay

## 2013-10-22 VITALS — BP 147/72 | HR 93 | Temp 98.0°F | Resp 18 | Ht 70.0 in | Wt 242.0 lb

## 2013-10-22 DIAGNOSIS — O99214 Obesity complicating childbirth: Secondary | ICD-10-CM

## 2013-10-22 DIAGNOSIS — F3289 Other specified depressive episodes: Secondary | ICD-10-CM | POA: Diagnosis present

## 2013-10-22 DIAGNOSIS — E282 Polycystic ovarian syndrome: Secondary | ICD-10-CM | POA: Diagnosis present

## 2013-10-22 DIAGNOSIS — O323XX0 Maternal care for face, brow and chin presentation, not applicable or unspecified: Secondary | ICD-10-CM | POA: Diagnosis present

## 2013-10-22 DIAGNOSIS — F411 Generalized anxiety disorder: Secondary | ICD-10-CM

## 2013-10-22 DIAGNOSIS — Z6834 Body mass index (BMI) 34.0-34.9, adult: Secondary | ICD-10-CM

## 2013-10-22 DIAGNOSIS — F329 Major depressive disorder, single episode, unspecified: Secondary | ICD-10-CM | POA: Diagnosis present

## 2013-10-22 DIAGNOSIS — O480 Post-term pregnancy: Secondary | ICD-10-CM | POA: Diagnosis present

## 2013-10-22 DIAGNOSIS — Z823 Family history of stroke: Secondary | ICD-10-CM | POA: Diagnosis not present

## 2013-10-22 DIAGNOSIS — E785 Hyperlipidemia, unspecified: Secondary | ICD-10-CM | POA: Diagnosis present

## 2013-10-22 DIAGNOSIS — Z3493 Encounter for supervision of normal pregnancy, unspecified, third trimester: Secondary | ICD-10-CM

## 2013-10-22 DIAGNOSIS — O323XX1 Maternal care for face, brow and chin presentation, fetus 1: Secondary | ICD-10-CM

## 2013-10-22 DIAGNOSIS — Z3490 Encounter for supervision of normal pregnancy, unspecified, unspecified trimester: Secondary | ICD-10-CM

## 2013-10-22 DIAGNOSIS — E669 Obesity, unspecified: Secondary | ICD-10-CM | POA: Diagnosis present

## 2013-10-22 DIAGNOSIS — O99344 Other mental disorders complicating childbirth: Secondary | ICD-10-CM | POA: Diagnosis present

## 2013-10-22 LAB — COMPREHENSIVE METABOLIC PANEL
ALBUMIN: 2.5 g/dL — AB (ref 3.5–5.2)
ALK PHOS: 117 U/L (ref 39–117)
ALT: 12 U/L (ref 0–35)
AST: 18 U/L (ref 0–37)
Anion gap: 16 — ABNORMAL HIGH (ref 5–15)
BUN: 8 mg/dL (ref 6–23)
CALCIUM: 9.7 mg/dL (ref 8.4–10.5)
CO2: 19 mEq/L (ref 19–32)
Chloride: 100 mEq/L (ref 96–112)
Creatinine, Ser: 0.42 mg/dL — ABNORMAL LOW (ref 0.50–1.10)
GFR calc Af Amer: >90 mL/min (ref >90)
GFR calc non Af Amer: >90 mL/min (ref >90)
Glucose, Bld: 119 mg/dL — ABNORMAL HIGH (ref 70–99)
POTASSIUM: 4.1 meq/L (ref 3.7–5.3)
SODIUM: 135 meq/L — AB (ref 137–147)
TOTAL PROTEIN: 5.9 g/dL — AB (ref 6.0–8.3)
Total Bilirubin: 0.3 mg/dL (ref 0.3–1.2)

## 2013-10-22 LAB — CBC
HEMATOCRIT: 35.3 % — AB (ref 36.0–46.0)
Hemoglobin: 11.8 g/dL — ABNORMAL LOW (ref 12.0–15.0)
MCH: 31.4 pg (ref 26.0–34.0)
MCHC: 33.4 g/dL (ref 30.0–36.0)
MCV: 93.9 fL (ref 78.0–100.0)
PLATELETS: 174 10*3/uL (ref 150–400)
RBC: 3.76 MIL/uL — ABNORMAL LOW (ref 3.87–5.11)
RDW: 13.6 % (ref 11.5–15.5)
WBC: 9.4 10*3/uL (ref 4.0–10.5)

## 2013-10-22 MED ORDER — IBUPROFEN 600 MG PO TABS: 600.0000 mg | ORAL_TABLET | Freq: Four times a day (QID) | ORAL | Status: DC | PRN

## 2013-10-22 MED ORDER — ACETAMINOPHEN 325 MG PO TABS: 650.0000 mg | ORAL_TABLET | ORAL | Status: DC | PRN

## 2013-10-22 MED ORDER — OXYTOCIN BOLUS FROM INFUSION: 500.0000 mL | INTRAVENOUS | Status: DC

## 2013-10-22 MED ORDER — CITRIC ACID-SODIUM CITRATE 334-500 MG/5ML PO SOLN
30.0000 mL | ORAL | Status: DC | PRN
  Administered 2013-10-23: 30 mL via ORAL
  Filled 2013-10-22: qty 15

## 2013-10-22 MED ORDER — LAMOTRIGINE 100 MG PO TABS
100.0000 mg | ORAL_TABLET | Freq: Every day | ORAL | Status: DC
  Administered 2013-10-22 – 2013-10-25 (×3): 100 mg via ORAL
  Filled 2013-10-22 (×4): qty 1

## 2013-10-22 MED ORDER — ONDANSETRON HCL 4 MG/2ML IJ SOLN
4.0000 mg | Freq: Four times a day (QID) | INTRAMUSCULAR | Status: DC | PRN
  Administered 2013-10-23: 4 mg via INTRAVENOUS
  Filled 2013-10-22: qty 2

## 2013-10-22 MED ORDER — LACTATED RINGERS IV SOLN
INTRAVENOUS | Status: DC
  Administered 2013-10-22 – 2013-10-23 (×5): via INTRAVENOUS

## 2013-10-22 MED ORDER — SERTRALINE HCL 100 MG PO TABS
100.0000 mg | ORAL_TABLET | Freq: Every day | ORAL | Status: DC
  Filled 2013-10-22 (×2): qty 1

## 2013-10-22 MED ORDER — OXYCODONE-ACETAMINOPHEN 5-325 MG PO TABS: 1.0000 | ORAL_TABLET | ORAL | Status: DC | PRN

## 2013-10-22 MED ORDER — BUTORPHANOL TARTRATE 1 MG/ML IJ SOLN: 1.0000 mg | INTRAMUSCULAR | Status: DC | PRN

## 2013-10-22 MED ORDER — LIDOCAINE HCL (PF) 1 % IJ SOLN: 30.0000 mL | INTRAMUSCULAR | Status: DC | PRN

## 2013-10-22 MED ORDER — OXYTOCIN 40 UNITS IN LACTATED RINGERS INFUSION - SIMPLE MED: 62.5000 mL/h | INTRAVENOUS | Status: DC

## 2013-10-22 MED ORDER — LACTATED RINGERS IV SOLN: 500.0000 mL | INTRAVENOUS | Status: DC | PRN

## 2013-10-22 MED ORDER — OXYTOCIN 40 UNITS IN LACTATED RINGERS INFUSION - SIMPLE MED
1.0000 m[IU]/min | INTRAVENOUS | Status: DC
  Administered 2013-10-23: 2 m[IU]/min via INTRAVENOUS
  Administered 2013-10-23: 40 [IU] via INTRAVENOUS
  Filled 2013-10-22: qty 1000

## 2013-10-22 MED ORDER — MISOPROSTOL 25 MCG QUARTER TABLET
25.0000 ug | ORAL_TABLET | ORAL | Status: DC | PRN
  Administered 2013-10-22: 25 ug via VAGINAL
  Filled 2013-10-22: qty 0.25

## 2013-10-22 MED ORDER — TERBUTALINE SULFATE 1 MG/ML IJ SOLN: 0.2500 mg | Freq: Once | INTRAMUSCULAR | Status: AC | PRN

## 2013-10-22 MED ORDER — SERTRALINE HCL 50 MG PO TABS
50.0000 mg | ORAL_TABLET | Freq: Every day | ORAL | Status: DC
  Filled 2013-10-22: qty 1

## 2013-10-22 MED ORDER — SERTRALINE HCL 50 MG PO TABS
50.0000 mg | ORAL_TABLET | Freq: Every day | ORAL | Status: DC
  Administered 2013-10-22 – 2013-10-25 (×3): 50 mg via ORAL
  Filled 2013-10-22 (×3): qty 1

## 2013-10-22 MED ORDER — ZOLPIDEM TARTRATE 5 MG PO TABS: 5.0000 mg | ORAL_TABLET | Freq: Every evening | ORAL | Status: DC | PRN

## 2013-10-22 NOTE — H&P (Signed)
Felicia Young is a 23 y.o. female G1P0 at 6840 6/7 weeks (EDD 10/16/13 by 7 week US) presenting for IOL with ripening given post due date.  Prenatal care uncomplicated except for depression that remained stable on lamictal and zoloft.  Maternal Medical History:  Contractions: Frequency: irregular.   Perceived severity is mild.    Fetal activity: Perceived fetal activity is normal.    Prenatal Complications - Diabetes: none.    OB History   Grav Para Term Preterm Abortions TAB SAB Ect Mult Living   1              Past Medical History  Diagnosis Date  . Generalized anxiety disorder   . ALLERGIC RHINITIS     seasonal  . Exercise-induced asthma   . Migraine   . Dyslipidemia 01/11/2013    CPX 01/2013 dx - Recommend diet/aerobic exercise changes with weight reduction  . PCOS (polycystic ovarian syndrome)   . Depression    Past Surgical History  Procedure Laterality Date  . Wisdom tooth extraction  2011  . Orif radial fracture Left 1999   Family History: family history includes Alcohol abuse in her maternal grandmother; Arthritis in her father and mother; Hyperlipidemia in her maternal grandfather, maternal grandmother, paternal grandfather, and paternal grandmother; Hypertension in her paternal grandfather; Stroke in her maternal grandmother. Social History:  reports that she has never smoked. She does not have any smokeless tobacco history on file. She reports that she does not drink alcohol or use illicit drugs.   Prenatal Transfer Tool  Maternal Diabetes: No Genetic Screening: Normal Maternal Ultrasounds/Referrals: Normal Fetal Ultrasounds or other Referrals:  None Maternal Substance Abuse:  No Significant Maternal Medications:  Meds include: Zoloft Other: Lamictal Significant Maternal Lab Results:  None Other Comments:  None  ROS    Last menstrual period 12/28/2012. Maternal Exam:  Uterine Assessment: Contraction strength is mild.  Contraction frequency is irregular.    Abdomen: Patient reports no abdominal tenderness. Fetal presentation: vertex  Introitus: Normal vulva. Normal vagina.    Physical Exam  Constitutional: She appears well-developed and well-nourished.  Cardiovascular: Normal rate and regular rhythm.   Respiratory: Effort normal.  GI: Soft.  Genitourinary: Vagina normal.  Psychiatric: She has a normal mood and affect.    Prenatal labs: ABO, Rh: --/--/O POS (01/11 1256) Antibody: Negative (12/08 0000) Rubella: Immune (12/08 0000) RPR: Nonreactive (12/08 0000)  HBsAg: Negative (12/08 0000)  HIV: Non-reactive (12/08 0000)  GBS: Negative (06/10 0000)  First trimester screen and AFP WNL One hour GTT 79  Assessment/Plan: Pt for post due date ripening and induction.  Cytotec this PM and pitocin in AM.   Huel CoteICHARDSON,Dawan Farney W 10/22/2013, 6:29 PM

## 2013-10-22 NOTE — Telephone Encounter (Signed)
Preadmission screen  

## 2013-10-23 ENCOUNTER — Inpatient Hospital Stay (HOSPITAL_COMMUNITY): Payer: Medicaid Other | Admitting: Anesthesiology

## 2013-10-23 ENCOUNTER — Encounter (HOSPITAL_COMMUNITY): Payer: Medicaid Other | Admitting: Anesthesiology

## 2013-10-23 ENCOUNTER — Encounter (HOSPITAL_COMMUNITY)
Admission: RE | Disposition: A | Discharge status: Home / Self Care | Payer: Self-pay | Source: Ambulatory Visit | Attending: Obstetrics and Gynecology

## 2013-10-23 ENCOUNTER — Encounter (HOSPITAL_COMMUNITY): Payer: Self-pay

## 2013-10-23 LAB — RPR

## 2013-10-23 SURGERY — Surgical Case
Anesthesia: Epidural | Site: Abdomen

## 2013-10-23 MED ORDER — HYDROMORPHONE HCL PF 1 MG/ML IJ SOLN: 0.2500 mg | INTRAMUSCULAR | Status: DC | PRN

## 2013-10-23 MED ORDER — MEPERIDINE HCL 25 MG/ML IJ SOLN: 6.2500 mg | INTRAMUSCULAR | Status: DC | PRN

## 2013-10-23 MED ORDER — CEFAZOLIN SODIUM-DEXTROSE 2-3 GM-% IV SOLR
2.0000 g | Freq: Once | INTRAVENOUS | Status: AC
  Administered 2013-10-23: 2 g via INTRAVENOUS
  Filled 2013-10-23: qty 50

## 2013-10-23 MED ORDER — KETOROLAC TROMETHAMINE 60 MG/2ML IM SOLN
INTRAMUSCULAR | Status: AC
  Filled 2013-10-23: qty 2

## 2013-10-23 MED ORDER — SODIUM BICARBONATE 8.4 % IV SOLN
INTRAVENOUS | Status: DC | PRN
  Administered 2013-10-23: 10 mL via EPIDURAL
  Administered 2013-10-23: 3 mL via EPIDURAL
  Administered 2013-10-23 (×2): 10 mL via EPIDURAL

## 2013-10-23 MED ORDER — 0.9 % SODIUM CHLORIDE (POUR BTL) OPTIME
TOPICAL | Status: DC | PRN
  Administered 2013-10-23: 1000 mL

## 2013-10-23 MED ORDER — LIDOCAINE-EPINEPHRINE (PF) 2 %-1:200000 IJ SOLN
INTRAMUSCULAR | Status: AC
  Filled 2013-10-23: qty 40

## 2013-10-23 MED ORDER — MORPHINE SULFATE (PF) 0.5 MG/ML IJ SOLN
INTRAMUSCULAR | Status: DC | PRN
  Administered 2013-10-23: 3 mg via EPIDURAL
  Administered 2013-10-23: 2 mg via INTRAVENOUS

## 2013-10-23 MED ORDER — PHENYLEPHRINE 40 MCG/ML (10ML) SYRINGE FOR IV PUSH (FOR BLOOD PRESSURE SUPPORT): 80.0000 ug | PREFILLED_SYRINGE | INTRAVENOUS | Status: DC | PRN

## 2013-10-23 MED ORDER — SCOPOLAMINE 1 MG/3DAYS TD PT72
1.0000 | MEDICATED_PATCH | Freq: Once | TRANSDERMAL | Status: DC
  Administered 2013-10-23: 1.5 mg via TRANSDERMAL

## 2013-10-23 MED ORDER — DIPHENHYDRAMINE HCL 50 MG/ML IJ SOLN: 12.5000 mg | INTRAMUSCULAR | Status: DC | PRN

## 2013-10-23 MED ORDER — FENTANYL 2.5 MCG/ML BUPIVACAINE 1/10 % EPIDURAL INFUSION (WH - ANES)
INTRAMUSCULAR | Status: AC
  Administered 2013-10-23: 14 mL/h via EPIDURAL
  Filled 2013-10-23: qty 125

## 2013-10-23 MED ORDER — EPHEDRINE 5 MG/ML INJ: 10.0000 mg | INTRAVENOUS | Status: DC | PRN

## 2013-10-23 MED ORDER — CEFAZOLIN SODIUM-DEXTROSE 2-3 GM-% IV SOLR
INTRAVENOUS | Status: AC
  Filled 2013-10-23: qty 50

## 2013-10-23 MED ORDER — DEXAMETHASONE SODIUM PHOSPHATE 10 MG/ML IJ SOLN
INTRAMUSCULAR | Status: AC
  Filled 2013-10-23: qty 1

## 2013-10-23 MED ORDER — OXYCODONE HCL 5 MG/5ML PO SOLN: 5.0000 mg | Freq: Once | ORAL | Status: AC | PRN

## 2013-10-23 MED ORDER — FENTANYL CITRATE 0.05 MG/ML IJ SOLN
INTRAMUSCULAR | Status: DC | PRN
  Administered 2013-10-23: 100 ug via EPIDURAL

## 2013-10-23 MED ORDER — MORPHINE SULFATE 0.5 MG/ML IJ SOLN
INTRAMUSCULAR | Status: AC
  Filled 2013-10-23: qty 10

## 2013-10-23 MED ORDER — PHENYLEPHRINE 40 MCG/ML (10ML) SYRINGE FOR IV PUSH (FOR BLOOD PRESSURE SUPPORT)
PREFILLED_SYRINGE | INTRAVENOUS | Status: AC
  Filled 2013-10-23: qty 5

## 2013-10-23 MED ORDER — LIDOCAINE HCL (PF) 1 % IJ SOLN
INTRAMUSCULAR | Status: DC | PRN
  Administered 2013-10-23: 10 mL

## 2013-10-23 MED ORDER — PROMETHAZINE HCL 25 MG/ML IJ SOLN: 6.2500 mg | INTRAMUSCULAR | Status: DC | PRN

## 2013-10-23 MED ORDER — FENTANYL CITRATE 0.05 MG/ML IJ SOLN
INTRAMUSCULAR | Status: AC
  Filled 2013-10-23: qty 2

## 2013-10-23 MED ORDER — SCOPOLAMINE 1 MG/3DAYS TD PT72
MEDICATED_PATCH | TRANSDERMAL | Status: AC
Start: 2013-10-23 — End: 2013-10-24
  Filled 2013-10-23: qty 1

## 2013-10-23 MED ORDER — ONDANSETRON HCL 4 MG/2ML IJ SOLN
INTRAMUSCULAR | Status: AC
  Filled 2013-10-23: qty 2

## 2013-10-23 MED ORDER — SODIUM BICARBONATE 8.4 % IV SOLN
INTRAVENOUS | Status: AC
  Filled 2013-10-23: qty 50

## 2013-10-23 MED ORDER — LACTATED RINGERS IV SOLN
INTRAVENOUS | Status: DC | PRN
  Administered 2013-10-23: 22:00:00 via INTRAVENOUS

## 2013-10-23 MED ORDER — LACTATED RINGERS IV SOLN
500.0000 mL | Freq: Once | INTRAVENOUS | Status: AC
  Administered 2013-10-23: 1000 mL via INTRAVENOUS

## 2013-10-23 MED ORDER — FENTANYL 2.5 MCG/ML BUPIVACAINE 1/10 % EPIDURAL INFUSION (WH - ANES)
14.0000 mL/h | INTRAMUSCULAR | Status: DC | PRN
  Administered 2013-10-23 (×2): 14 mL/h via EPIDURAL
  Filled 2013-10-23: qty 125

## 2013-10-23 MED ORDER — OXYTOCIN 10 UNIT/ML IJ SOLN
INTRAMUSCULAR | Status: AC
  Filled 2013-10-23: qty 4

## 2013-10-23 MED ORDER — FENTANYL 2.5 MCG/ML BUPIVACAINE 1/10 % EPIDURAL INFUSION (WH - ANES): 14.0000 mL/h | INTRAMUSCULAR | Status: DC | PRN

## 2013-10-23 MED ORDER — PHENYLEPHRINE 40 MCG/ML (10ML) SYRINGE FOR IV PUSH (FOR BLOOD PRESSURE SUPPORT)
PREFILLED_SYRINGE | INTRAVENOUS | Status: AC
  Filled 2013-10-23: qty 10

## 2013-10-23 MED ORDER — PHENYLEPHRINE HCL 10 MG/ML IJ SOLN
INTRAMUSCULAR | Status: DC | PRN
  Administered 2013-10-23 (×2): 40 ug via INTRAVENOUS

## 2013-10-23 MED ORDER — KETOROLAC TROMETHAMINE 60 MG/2ML IM SOLN
60.0000 mg | Freq: Once | INTRAMUSCULAR | Status: AC | PRN
  Administered 2013-10-23: 60 mg via INTRAMUSCULAR

## 2013-10-23 MED ORDER — OXYCODONE HCL 5 MG PO TABS: 5.0000 mg | ORAL_TABLET | Freq: Once | ORAL | Status: AC | PRN

## 2013-10-23 SURGICAL SUPPLY — 32 items
BENZOIN TINCTURE PRP APPL 2/3 (GAUZE/BANDAGES/DRESSINGS) ×2 IMPLANT
CLAMP CORD UMBIL (MISCELLANEOUS) ×2 IMPLANT
CLOTH BEACON ORANGE TIMEOUT ST (SAFETY) ×2 IMPLANT
DRAPE CESAREAN BIRTH W POUCH (DRAPES) ×2 IMPLANT
DRAPE LG THREE QUARTER DISP (DRAPES) ×2 IMPLANT
DRSG OPSITE POSTOP 4X10 (GAUZE/BANDAGES/DRESSINGS) ×2 IMPLANT
DURAPREP 26ML APPLICATOR (WOUND CARE) ×2 IMPLANT
ELECT REM PT RETURN 9FT ADLT (ELECTROSURGICAL) ×2
ELECTRODE REM PT RTRN 9FT ADLT (ELECTROSURGICAL) ×1 IMPLANT
EXTRACTOR VACUUM KIWI (MISCELLANEOUS) IMPLANT
GLOVE BIO SURGEON STRL SZ 6.5 (GLOVE) ×2 IMPLANT
GOWN STRL REUS W/TWL LRG LVL3 (GOWN DISPOSABLE) ×4 IMPLANT
KIT ABG SYR 3ML LUER SLIP (SYRINGE) IMPLANT
NEEDLE HYPO 25X5/8 SAFETYGLIDE (NEEDLE) IMPLANT
NS IRRIG 1000ML POUR BTL (IV SOLUTION) ×2 IMPLANT
PACK C SECTION WH (CUSTOM PROCEDURE TRAY) ×2 IMPLANT
PAD OB MATERNITY 4.3X12.25 (PERSONAL CARE ITEMS) ×2 IMPLANT
RTRCTR C-SECT PINK 25CM LRG (MISCELLANEOUS) ×2 IMPLANT
STRIP CLOSURE SKIN 1/2X4 (GAUZE/BANDAGES/DRESSINGS) ×2 IMPLANT
SUT CHROMIC 1 CTX 36 (SUTURE) ×4 IMPLANT
SUT PLAIN 0 NONE (SUTURE) IMPLANT
SUT PLAIN 2 0 XLH (SUTURE) ×2 IMPLANT
SUT VIC AB 0 CT1 27 (SUTURE) ×2
SUT VIC AB 0 CT1 27XBRD ANBCTR (SUTURE) ×2 IMPLANT
SUT VIC AB 2-0 CT1 27 (SUTURE) ×1
SUT VIC AB 2-0 CT1 TAPERPNT 27 (SUTURE) ×1 IMPLANT
SUT VIC AB 3-0 CT1 27 (SUTURE)
SUT VIC AB 3-0 CT1 TAPERPNT 27 (SUTURE) IMPLANT
SUT VIC AB 4-0 KS 27 (SUTURE) ×2 IMPLANT
TOWEL OR 17X24 6PK STRL BLUE (TOWEL DISPOSABLE) ×2 IMPLANT
TRAY FOLEY CATH 14FR (SET/KITS/TRAYS/PACK) IMPLANT
WATER STERILE IRR 1000ML POUR (IV SOLUTION) IMPLANT

## 2013-10-23 NOTE — Anesthesia Preprocedure Evaluation (Addendum)
Anesthesia Evaluation  Patient identified by MRN, date of birth, ID band Patient awake    Reviewed: Allergy & Precautions, H&P , Patient's Chart, lab work & pertinent test results  Airway Mallampati: II TM Distance: >3 FB Neck ROM: full    Dental  (+) Teeth Intact   Pulmonary asthma (almost never uses inhaler) ,  breath sounds clear to auscultation        Cardiovascular Rhythm:regular Rate:Normal     Neuro/Psych    GI/Hepatic   Endo/Other  Morbid obesity  Renal/GU      Musculoskeletal   Abdominal   Peds  Hematology   Anesthesia Other Findings       Reproductive/Obstetrics (+) Pregnancy                           Anesthesia Physical  Anesthesia Plan  ASA: III  Anesthesia Plan: Epidural   Post-op Pain Management:    Induction:   Airway Management Planned:   Additional Equipment:   Intra-op Plan:   Post-operative Plan:   Informed Consent: I have reviewed the patients History and Physical, chart, labs and discussed the procedure including the risks, benefits and alternatives for the proposed anesthesia with the patient or authorized representative who has indicated his/her understanding and acceptance.   Dental Advisory Given  Plan Discussed with:   Anesthesia Plan Comments: (Labs checked- platelets confirmed with RN in room. Fetal heart tracing, per RN, reported to be stable enough for sitting procedure. Discussed epidural, and patient consents to the procedure:  included risk of possible headache,backache, failed block, allergic reaction, and nerve injury. This patient was asked if she had any questions or concerns before the procedure started.)        Anesthesia Quick Evaluation  

## 2013-10-23 NOTE — Progress Notes (Signed)
Patient ID: Felicia Young, female   DOB: 06-28-1990, 23 y.o.   MRN: [id number] Pt has had periods of adequate contractions FHR overall reassuring, category 2 Cervix  80/2/-3 Now feels to be a full face presentation, mentum posterior (felt more brow before)  D/w pt options of continuing to labor to see if will progress or convert to vertex vs proceeding with c-section. We discussed the risks and benefits of both options at length and given her slow progress throughout the day, She wishes to proceed with c-section  Risks of c-section were reviewed, including bleeding, infection and possible damage to bowel and bladder Pt desires to proceed.  OR notified and preparing, pitocin d/c'd

## 2013-10-23 NOTE — Progress Notes (Addendum)
Patient ID: Felicia Young, female   DOB: [birth year], 23 y.o.   MRN: [id number] Pt got uncomfortable and received epidural afeb vss FHR reassuring Cervix 70/1/-2 IUPC placed for increasing pitocin

## 2013-10-23 NOTE — Transfer of Care (Addendum)
Immediate Anesthesia Transfer of Care Note  Patient: Felicia Young  Procedure(s) Performed: Primary Cesarean Section Delivery  Patient Location: PACU  Anesthesia Type:Epidural  Level of Consciousness: awake, alert , oriented and patient cooperative  Airway & Oxygen Therapy: Patient Spontanous Breathing  Post-op Assessment: Report given to PACU RN, Post -op Vital signs reviewed and stable and Patient moving all extremities  Post vital signs: Reviewed and stable  Complications: No apparent anesthesia complications

## 2013-10-23 NOTE — Progress Notes (Addendum)
Patient ID: Felicia Young, female   DOB: 1990-04-24, 23 y.o.   MRN: [id number] Pt comfortable with epidural afeb vss FHR reassuring with occasional variable decels IUPC in place but difficult to establish adequate pattern, pitocin at 10 mu.   Cervix 80/2/-2 at 1830pm Vertex palpable to pt left on entering cervix, but possible face palpated to the right, US confirms vertex with no other parts presenting but clearly a small part is felt to the righ that can either be a nose or ear.  Probable brow presentation D/w pt possible malpresentation causing slower progress.  It is hard to tell exact presentation as cervix not very dilated.  Will give it a bit more time and recheck to see if more clear when more dilated.  D/w her possible need for c-section if malpresentation persists and no progress.

## 2013-10-23 NOTE — Anesthesia Preprocedure Evaluation (Deleted)
Anesthesia Evaluation  Patient identified by MRN, date of birth, ID band Patient awake    Reviewed: Allergy & Precautions, H&P , Patient's Chart, lab work & pertinent test results  Airway Mallampati: II TM Distance: >3 FB Neck ROM: full    Dental  (+) Teeth Intact   Pulmonary asthma (almost never uses inhaler) ,  breath sounds clear to auscultation        Cardiovascular Rhythm:regular Rate:Normal     Neuro/Psych    GI/Hepatic   Endo/Other  Morbid obesity  Renal/GU      Musculoskeletal   Abdominal   Peds  Hematology   Anesthesia Other Findings       Reproductive/Obstetrics (+) Pregnancy                           Anesthesia Physical  Anesthesia Plan  ASA: III  Anesthesia Plan: Epidural   Post-op Pain Management:    Induction:   Airway Management Planned:   Additional Equipment:   Intra-op Plan:   Post-operative Plan:   Informed Consent: I have reviewed the patients History and Physical, chart, labs and discussed the procedure including the risks, benefits and alternatives for the proposed anesthesia with the patient or authorized representative who has indicated his/her understanding and acceptance.   Dental Advisory Given  Plan Discussed with:   Anesthesia Plan Comments: (Labs checked- platelets confirmed with RN in room. Fetal heart tracing, per RN, reported to be stable enough for sitting procedure. Discussed epidural, and patient consents to the procedure:  included risk of possible headache,backache, failed block, allergic reaction, and nerve injury. This patient was asked if she had any questions or concerns before the procedure started.)        Anesthesia Quick Evaluation

## 2013-10-23 NOTE — Op Note (Signed)
Operative note  Preoperative diagnosis Postterm pregnancy at 41 weeks Persistent brow presentation Arrest of dilation  Postoperative diagnoses Same  Procedure Primary low transverse C-section with 2 layer closure of uterus  Surgeon Dr. Huel CoteKathy Phuong Hillary  Anesthesia Epidural x2  Fluids Estimated blood loss 1000 cc Urine output 100 cc clear IV fluid 2200 cc LR  Findings There was a viable female infant with a brow presentation and a double nuchal cord. Apgars were 8 and 9. Weight was 8 lbs. 13 oz. placenta was normal. Uterus ovaries and tubes appeared normal.  Specimen Placenta sent to L&D  Procedure note  Patient was taken to the operating room where epidural anesthesia was boosted but despite waiting 30 minutes did not ever achieve adequate anesthetic level. The patient was then sat up and a second epidural assistant and this one found to be adequate by Allis clamp test. She was prepped and draped in the normal sterile fashion in the dorsal supine position with a leftward tilt. An appropriate time out was performed. A Pfannenstiel skin incision was then made with the scalpel and carried through to the underlying layer of fascia by sharp dissection and Bovie cautery. The fascia was nicked in the midline and the incision was extended laterally with Mayo scissors. The inferior aspect of the incision was grasped Coker clamps and dissected off the underlying rectus muscles. In a similar fashion the superior aspect was dissected off the rectus muscles. Rectus muscles were separated in the midline and the peritoneal cavity entered bluntly. The peritoneal incision was then extended both superiorly and inferiorly with careful attention to avoid both bowel and bladder. The Alexis self-retaining wound retractor was then placed within the incision and the lower uterine segment exposed. The bladder flap was developed with Metzenbaum scissors and pushed away from the lower uterine segment. The lower  uterine segment was then incised in a transverse fashion and the cavity itself entered bluntly. The incision was extended bluntly. The infant's head was noted to be in a fully extended position with a brow and face presentation. The head was then deflexed and the vertex lifted to the level of the incision and delivered  without difficulty. There was a nuchal cord x2 which was reduced. The remainder of the infant delivered and the nose and mouth bulb suctioned with the cord clamped and cut as well. The infant had some significant edema of the brow but otherwise appeared vigorous and normal in appearance. The infant was handed off to the waiting pediatricians. The placenta was then spontaneously expressed from the uterus after cord blood obtained for donation and the uterus cleared of all clots and debris with moist lap sponge. The uterine incision was then repaired in 2 layers the first layer was a running locked layer 1-0 chromic and the second an imbricating layer of the same suture. The tubes and ovaries were inspected and the gutters cleared of all clots and debris. The uterine incision was inspected and found to be hemostatic. All instruments and sponges as well as the Alexis retractor were then removed from the abdomen. The rectus muscles and peritoneum were then reapproximated with several interrupted mattress sutures of 2-0 Vicryl. The fascia was then closed with 0 Vicryl in a running fashion. Subcutaneous tissue was reapproximated with 3-0 plain in a running fashion. The skin was closed with a subcuticular stitch of 4-0 Vicryl on a Keith needle and then reinforced with benzoin and Steri-Strips. At the conclusion of the procedure all instruments and sponge counts were  correct. Patient was taken to the recovery room in good condition with her baby accompanying her skin to skin.

## 2013-10-23 NOTE — Progress Notes (Signed)
Patient ID: Felicia Young, female   DOB: 04-Mar-1991, 23 y.o.   MRN: [id number] Pt admitted overnight for ripening but only received cytotec x 1, started on pitocin this AM Having very mild contractions FHR category 1 Cervix 1/70/-2  AROM clear Continue pitocin

## 2013-10-23 NOTE — Anesthesia Procedure Notes (Addendum)
Epidural Patient location during procedure: OB  Preanesthetic Checklist Completed: patient identified, site marked, surgical consent, pre-op evaluation, timeout performed, IV checked, risks and benefits discussed and monitors and equipment checked  Epidural Patient position: sitting Prep: site prepped and draped and DuraPrep Patient monitoring: continuous pulse ox and blood pressure Approach: midline Injection technique: LOR air  Needle:  Needle type: Tuohy  Needle gauge: 17 G Needle length: 9 cm and 9 Needle insertion depth: 6 cm Catheter type: closed end flexible Catheter size: 19 Gauge Catheter at skin depth: 12 cm Test dose: negative  Assessment Events: blood not aspirated, injection not painful, no injection resistance, negative IV test and no paresthesia  Additional Notes Dosing of Epidural:  1st dose, through catheter ............................................Marland Kitchen.  Xylocaine 40 mg  2nd dose, through catheter, after waiting 3 minutes........Marland Kitchen.Xylocaine 60 mg    ( 1% Xylo charted as a single dose in Epic Meds for ease of charting; actual dosing was fractionated as above, for saftey's sake)  As each dose occurred, patient was free of IV sx; and patient exhibited no evidence of SA injection.  Patient is more comfortable after epidural dosed. Please see RN's note for documentation of vital signs,and FHR which are stable.  Patient reminded not to try to ambulate with numb legs, and that an RN must be present when she attempts to get up.      Epidural Patient location during procedure: OB  Staffing Anesthesiologist: Lewie LoronGERMEROTH, Velencia Lenart R Performed by: anesthesiologist   Preanesthetic Checklist Completed: patient identified, pre-op evaluation, timeout performed, IV checked, risks and benefits discussed and monitors and equipment checked  Epidural Patient position: sitting Prep: site prepped and draped and DuraPrep Patient monitoring: heart rate Approach:  midline Location: L3-L4 Injection technique: LOR air and LOR saline  Needle:  Needle type: Tuohy  Needle gauge: 17 G Needle length: 9 cm Needle insertion depth: 7 cm Catheter type: closed end flexible Catheter size: 19 Gauge Catheter at skin depth: 13 cm Test dose: negative  Assessment Sensory level: T8 Events: blood not aspirated, injection not painful, no injection resistance, negative IV test and no paresthesia  Additional Notes Non-functioning epidural removed and replaced.Reason for block:procedure for pain

## 2013-10-24 ENCOUNTER — Encounter (HOSPITAL_COMMUNITY): Payer: Self-pay

## 2013-10-24 DIAGNOSIS — O323XX0 Maternal care for face, brow and chin presentation, not applicable or unspecified: Secondary | ICD-10-CM | POA: Diagnosis present

## 2013-10-24 LAB — CBC
HEMATOCRIT: 28.6 % — AB (ref 36.0–46.0)
HEMOGLOBIN: 9.6 g/dL — AB (ref 12.0–15.0)
MCH: 31.2 pg (ref 26.0–34.0)
MCHC: 33.6 g/dL (ref 30.0–36.0)
MCV: 92.9 fL (ref 78.0–100.0)
Platelets: 140 10*3/uL — ABNORMAL LOW (ref 150–400)
RBC: 3.08 MIL/uL — ABNORMAL LOW (ref 3.87–5.11)
RDW: 13.6 % (ref 11.5–15.5)
WBC: 14.3 10*3/uL — ABNORMAL HIGH (ref 4.0–10.5)

## 2013-10-24 LAB — CCBB MATERNAL DONOR DRAW

## 2013-10-24 MED ORDER — SIMETHICONE 80 MG PO CHEW
80.0000 mg | CHEWABLE_TABLET | ORAL | Status: DC | PRN
  Filled 2013-10-24: qty 1

## 2013-10-24 MED ORDER — ONDANSETRON HCL 4 MG/2ML IJ SOLN: 4.0000 mg | Freq: Three times a day (TID) | INTRAMUSCULAR | Status: DC | PRN

## 2013-10-24 MED ORDER — NALBUPHINE HCL 10 MG/ML IJ SOLN
5.0000 mg | INTRAMUSCULAR | Status: DC | PRN
Start: 2013-10-24 — End: 2013-10-26

## 2013-10-24 MED ORDER — TETANUS-DIPHTH-ACELL PERTUSSIS 5-2.5-18.5 LF-MCG/0.5 IM SUSP
0.5000 mL | Freq: Once | INTRAMUSCULAR | Status: AC
  Administered 2013-10-25: 0.5 mL via INTRAMUSCULAR
  Filled 2013-10-24: qty 0.5

## 2013-10-24 MED ORDER — IBUPROFEN 600 MG PO TABS
600.0000 mg | ORAL_TABLET | Freq: Four times a day (QID) | ORAL | Status: DC
  Administered 2013-10-24 – 2013-10-26 (×8): 600 mg via ORAL
  Filled 2013-10-24 (×8): qty 1

## 2013-10-24 MED ORDER — LANOLIN HYDROUS EX OINT: 1.0000 "application " | TOPICAL_OINTMENT | CUTANEOUS | Status: DC | PRN

## 2013-10-24 MED ORDER — ONDANSETRON HCL 4 MG/2ML IJ SOLN: 4.0000 mg | INTRAMUSCULAR | Status: DC | PRN

## 2013-10-24 MED ORDER — SERTRALINE HCL 50 MG PO TABS: 50.0000 mg | ORAL_TABLET | Freq: Every day | ORAL | Status: DC

## 2013-10-24 MED ORDER — DIPHENHYDRAMINE HCL 25 MG PO CAPS: 25.0000 mg | ORAL_CAPSULE | ORAL | Status: DC | PRN

## 2013-10-24 MED ORDER — LAMOTRIGINE 100 MG PO TABS: 100.0000 mg | ORAL_TABLET | Freq: Every day | ORAL | Status: DC

## 2013-10-24 MED ORDER — METOCLOPRAMIDE HCL 5 MG/ML IJ SOLN: 10.0000 mg | Freq: Three times a day (TID) | INTRAMUSCULAR | Status: DC | PRN

## 2013-10-24 MED ORDER — ZOLPIDEM TARTRATE 5 MG PO TABS: 5.0000 mg | ORAL_TABLET | Freq: Every evening | ORAL | Status: DC | PRN

## 2013-10-24 MED ORDER — MENTHOL 3 MG MT LOZG: 1.0000 | LOZENGE | OROMUCOSAL | Status: DC | PRN

## 2013-10-24 MED ORDER — OXYTOCIN 40 UNITS IN LACTATED RINGERS INFUSION - SIMPLE MED: 62.5000 mL/h | INTRAVENOUS | Status: AC

## 2013-10-24 MED ORDER — ONDANSETRON HCL 4 MG PO TABS: 4.0000 mg | ORAL_TABLET | ORAL | Status: DC | PRN

## 2013-10-24 MED ORDER — KETOROLAC TROMETHAMINE 30 MG/ML IJ SOLN: 30.0000 mg | Freq: Four times a day (QID) | INTRAMUSCULAR | Status: AC | PRN

## 2013-10-24 MED ORDER — SIMETHICONE 80 MG PO CHEW
80.0000 mg | CHEWABLE_TABLET | Freq: Three times a day (TID) | ORAL | Status: DC
  Administered 2013-10-24 – 2013-10-26 (×7): 80 mg via ORAL
  Filled 2013-10-24 (×6): qty 1

## 2013-10-24 MED ORDER — LACTATED RINGERS IV SOLN
INTRAVENOUS | Status: DC
  Administered 2013-10-24: 09:00:00 via INTRAVENOUS

## 2013-10-24 MED ORDER — DIPHENHYDRAMINE HCL 50 MG/ML IJ SOLN: 25.0000 mg | INTRAMUSCULAR | Status: DC | PRN

## 2013-10-24 MED ORDER — DIPHENHYDRAMINE HCL 25 MG PO CAPS: 25.0000 mg | ORAL_CAPSULE | Freq: Four times a day (QID) | ORAL | Status: DC | PRN

## 2013-10-24 MED ORDER — WITCH HAZEL-GLYCERIN EX PADS: 1.0000 "application " | MEDICATED_PAD | CUTANEOUS | Status: DC | PRN

## 2013-10-24 MED ORDER — SIMETHICONE 80 MG PO CHEW
80.0000 mg | CHEWABLE_TABLET | ORAL | Status: DC
  Administered 2013-10-25 (×2): 80 mg via ORAL
  Filled 2013-10-24 (×2): qty 1

## 2013-10-24 MED ORDER — PRENATAL MULTIVITAMIN CH
1.0000 | ORAL_TABLET | Freq: Every day | ORAL | Status: DC
  Administered 2013-10-24 – 2013-10-25 (×2): 1 via ORAL
  Filled 2013-10-24 (×2): qty 1

## 2013-10-24 MED ORDER — DEXTROSE 5 % IV SOLN
1.0000 ug/kg/h | INTRAVENOUS | Status: DC | PRN
  Filled 2013-10-24: qty 2

## 2013-10-24 MED ORDER — NALOXONE HCL 0.4 MG/ML IJ SOLN: 0.4000 mg | INTRAMUSCULAR | Status: DC | PRN

## 2013-10-24 MED ORDER — DIBUCAINE 1 % RE OINT: 1.0000 "application " | TOPICAL_OINTMENT | RECTAL | Status: DC | PRN

## 2013-10-24 MED ORDER — KETOROLAC TROMETHAMINE 30 MG/ML IJ SOLN
30.0000 mg | Freq: Four times a day (QID) | INTRAMUSCULAR | Status: AC | PRN
  Administered 2013-10-24 (×2): 30 mg via INTRAVENOUS
  Filled 2013-10-24 (×2): qty 1

## 2013-10-24 MED ORDER — SENNOSIDES-DOCUSATE SODIUM 8.6-50 MG PO TABS
2.0000 | ORAL_TABLET | ORAL | Status: DC
  Administered 2013-10-25 (×2): 2 via ORAL
  Filled 2013-10-24 (×2): qty 2

## 2013-10-24 MED ORDER — SODIUM CHLORIDE 0.9 % IJ SOLN: 3.0000 mL | INTRAMUSCULAR | Status: DC | PRN

## 2013-10-24 MED ORDER — ALBUTEROL SULFATE (2.5 MG/3ML) 0.083% IN NEBU: 3.0000 mL | INHALATION_SOLUTION | Freq: Four times a day (QID) | RESPIRATORY_TRACT | Status: DC | PRN

## 2013-10-24 MED ORDER — OXYCODONE-ACETAMINOPHEN 5-325 MG PO TABS
1.0000 | ORAL_TABLET | ORAL | Status: DC | PRN
  Administered 2013-10-24: 2 via ORAL
  Administered 2013-10-24: 1 via ORAL
  Administered 2013-10-24 – 2013-10-26 (×5): 2 via ORAL
  Filled 2013-10-24 (×3): qty 2
  Filled 2013-10-24: qty 1
  Filled 2013-10-24 (×4): qty 2

## 2013-10-24 MED ORDER — DIPHENHYDRAMINE HCL 50 MG/ML IJ SOLN: 12.5000 mg | INTRAMUSCULAR | Status: DC | PRN

## 2013-10-24 NOTE — Progress Notes (Signed)
PSYCHOSOCIAL ASSESSMENT ~ MATERNAL/CHILD Name: Felicia Young                                                                Age: 23 years old         Referral Date:  10/24/13   Reason/Source: [name] referred for history of depression and anxiety.   I.          FAMILY/HOME ENVIRONMENT A. Child's Legal Guardian _x__Parent(s) ___Grandparent ___Foster parent ___DSS_________________ Name: Felicia Young                                            DOB: 06-10-1990        Age: 58   Address: [address], [place] [address]  Name: Felicia Young                                             DOB: //                     Age:   Address: Same as above  B. Other Household Members/Support Person  Name:                                        Relationship:                        DOB ___/___/___                   Name:                                         Relationship:                        DOB ___/___/___                   Name:                                         Relationship:                        DOB ___/___/___                   Name:                                         Relationship:                        DOB ___/___/___  C. Other Support: [name]  reported that [name] and [name] live at the same address as [name] and [name], but they live in separate homes on the same piece of property.  [name] shared that his family lives in [place], and that all family members are supportive to [name].  [name] also endorsed being involved in a church community that she identifies as a positive support.    PSYCHOSOCIAL DATA A. Information Source                                                                                              __Patient Interview      _x_Family Interview           __Other___________ [name] provided consent for [name] and [name] to be present for the assessment. [name] and [name] provided collateral information, but majority of assessment completed by [name].   B. 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  [name] discussed that she does not plan on working when she returns home.  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 _x_Medicaid    [place]:  [place]               __Private Insurance:                   __Self Pay  __Food Stamps   _x_WIC  __Work First     __Public Housing     __Section 8    __Maternity Care Coordination/Child Service Coordination/Early Intervention   ___School:                                                                         Grade:  __Other:   [name] and Environment Information Cultural Issues Impacting Care: None  STRENGTHS _x__Supportive family/friends _x__Adequate Resources _x__Compliance with medical plan _x__Home prepared for Child (including basic supplies) _x__Understanding of illness      ___Other: RISK FACTORS AND CURRENT PROBLEMS          ____No Problems Noted  Pt              Family      Substance Abuse                                                                ___              ___         Mental Illness                                                                         _x__              _x__             Family/Relationship Issues                                                   ___               ___             Abuse/Neglect/Domestic Violence                                         ___              ___             Financial Resources                                                               ___              ___             Transportation                                                                        ___               ___             DSS Involvement  ___               ___             Adjustment to Illness                                                               ___              ___             Knowledge/Cognitive Deficit                                                   ___              ___             Compliance with Treatment                                                   ___              ___             Basic Needs (food, housing, etc.)                                          ___              ___             Housing Concerns                                                                  ___              ___ Other_____________________________________________________________                                  SOCIAL WORK ASSESSMENT CSW met with [name] in her first floor room/Room 105 to complete the assessment due to history of anxiety and depression.  [name] was visiting with [name] and [name] as CSW entered the room, but [name] provided consent to complete the assessment with them being present.  [name] was observed to be holding her baby in her arms, smiled as she was holding her baby, and stated that she felt happy as a new mother.   [name] denied any questions or concerns about CSW completing the assessment, was receptive to answering questions.  [name], [name], and [name] open and forthcoming with information, expressed gratitude for CSW to meet with [name] due to her mental health history.  [name] shared that she lives with at home with the [name].  [name] and [name] married in October 2014, and [name] and [name] identified [name] as a positive person and a healthy support for [name].  [name] discussed how their home is on the same piece of property as the [name] and MGF, discussed impressions that [name]'s family is also involved and all are identified as health supports.  [name] and [name] discussed that all basic needs are met, all items have been secured for the baby, and they will follow-up with [facility].  [name] and [name] have received information regarding WIC and will follow-up if needed.  [name] is employed  at a car [employer] store, [name] reported intention to stay at home with the baby.  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    [name] openly discussed history of depression and anxiety.  She endorsed prescription of psychotropic medications since she was 23 years old.  [name] discussed that she has previously participated in outpatient therapy with Rosalia Hammers, but therapy ended when she was discharged from services due to progress.  [name] stated that she is currently receiving medication management from Dr. Clovis Pu at [facility], endorsed positive therapeutic relationship with MD.  [name] shared that Dr. Clovis Pu has been actively involved in her prenatal care.  She shared that she attempted to cease her Zoloft during pregnancy, but noted that her symptoms of tearfulness and anxiety returned.  Dr. Clovis Pu reduced Zoloft from 145m to 522m and [name] reported that she was able to do well with this dosage.  She shared intention to follow-up with Dr. CoClovis Puhas follow-up appointment for shortly after she returns home.  [name] reported that she will return to previous dosage of 1005moloft and will continue on 100m2mmictal.   [name], family, and CSW discussed risk factors for PPD, but also explored positive supports that are present.  [name] aware that she is at high-risk for PPD, but shared belief that her family is supportive and will notify her if any symptoms occur.  [name] also confirmed that [name] is self-aware and has insight on when her symptoms begin to worsen and intensify.  [name] willing to notify supports if she notes symptoms of PPD. [name] denied any current need to receive therapy, but she shared that she will consult with Dr. CottClovis Puher thoughts change.  CSW also discussed warning signs of PPD for [name], [name] receptive to exploring how his thoughts and feelings may be impacted by the role transition, but did not express any difficulties thus far with role transition.   CSW encouraged  [name] and family to reach out to CSW if additional questions or needs arise during admission.  No barriers to discharge.    SOCIAL WORK PLAN             _x__No Further Intervention Required/No Barriers to Discharge              ___Psychosocial Support and Ongoing Assessment of Needs              _x__Patient/Family Education: PPD             ___Child Protective Services Report   County___________ Date___/____/____              ___Information/Referral to [employer]_________________________              ___Other:

## 2013-10-24 NOTE — Addendum Note (Signed)
Addendum created 10/24/13 1847 by Orlie Pollenebra R Nazarene Bunning, CRNA   Modules edited: Notes Section   Notes Section:  File: [id number]; File: [id number]

## 2013-10-24 NOTE — Progress Notes (Signed)
Subjective: Postpartum Day 1 Cesarean Delivery Patient reports incisional pain and tolerating PO.    Objective: Vital signs in last 24 hours: Temp:  [98 F (36.7 C)-99.4 F (37.4 C)] 98 F (36.7 C) (07/23 0800) Pulse Rate:  [81-134] 91 (07/23 0800) Resp:  [15-33] 16 (07/23 0800) BP: (109-156)/(57-110) 126/78 mmHg (07/23 0800) SpO2:  [95 %-100 %] 97 % (07/23 0800)  Physical Exam:  General: alert and cooperative Lochia: appropriate Uterine Fundus: firm Incision: C/D/I    Recent Labs  10/22/13 2000 10/24/13 0740  HGB 11.8* 9.6*  HCT 35.3* 28.6*    Assessment/Plan: Status post Cesarean section. Doing well postoperatively.  Continue current care.  Felicia Young,Felicia Young 10/24/2013, 9:11 AM

## 2013-10-24 NOTE — Anesthesia Postprocedure Evaluation (Deleted)
  Anesthesia Post-op Note  Patient: Felicia Young  Procedure(s) Performed: Procedure(s): Primary Cesarean Section Delivery Baby Girl @ 22 08, Apgars 7/9 (N/A)  Patient Location: Mother/Baby  Anesthesia Type:Epidural  Level of Consciousness: awake, alert , oriented and patient cooperative  Airway and Oxygen Therapy: Patient Spontanous Breathing  Post-op Pain: mild  Post-op Assessment: Post-op Vital signs reviewed, Patient's Cardiovascular Status Stable, Respiratory Function Stable, Patent Airway, No signs of Nausea or vomiting, Adequate PO intake, Pain level controlled, No headache, No backache, No residual numbness and No residual motor weakness  Post-op Vital Signs: Reviewed and stable  Last Vitals:  Filed Vitals:   10/24/13 0800  BP: 126/78  Pulse: 91  Temp: 36.7 C  Resp: 16    Complications: No apparent anesthesia complications

## 2013-10-24 NOTE — Anesthesia Postprocedure Evaluation (Addendum)
Anesthesia Post Note  Patient: Felicia Young  Procedure(s) Performed: Primary Cesarean Section Delivery  Anesthesia type: Epidural  Patient location: PACU  Post pain: Pain level controlled  Post assessment: Post-op Vital signs reviewed  Last Vitals: BP 142/89  Pulse 97  Temp(Src) 37.4 C (Oral)  Resp 20  Ht 5\' 10"  (1.778 m)  Wt 242 lb (109.77 kg)  BMI 34.72 kg/m2  SpO2 96%  LMP 12/28/2012  Breastfeeding? Unknown  Post vital signs: Reviewed  Level of consciousness: sedated  Complications: No apparent anesthesia complications

## 2013-10-25 ENCOUNTER — Encounter (HOSPITAL_COMMUNITY): Payer: Self-pay | Admitting: Obstetrics and Gynecology

## 2013-10-25 LAB — BIRTH TISSUE RECOVERY COLLECTION (PLACENTA DONATION)

## 2013-10-25 NOTE — Progress Notes (Signed)
Subjective: Postpartum Day 2: Cesarean Delivery Patient reports incisional pain, tolerating PO and no problems voiding.    Objective: Vital signs in last 24 hours: Temp:  [97.7 F (36.5 C)-99 F (37.2 C)] 97.7 F (36.5 C) (07/24 0605) Pulse Rate:  [87-113] 87 (07/24 0605) Resp:  [18-20] 20 (07/24 0605) BP: (101-138)/(60-74) 101/60 mmHg (07/24 0605) SpO2:  [97 %-98 %] 97 % (07/23 1400)  Physical Exam:  General: alert and cooperative Lochia: appropriate Uterine Fundus: firm Incision: C/D/I   Recent Labs  10/22/13 2000 10/24/13 0740  HGB 11.8* 9.6*  HCT 35.3* 28.6*    Assessment/Plan: Status post Cesarean section. Doing well postoperatively.  Continue current care.  Oliver PilaICHARDSON,Deyjah Kindel W 10/25/2013, 8:14 AM

## 2013-10-25 NOTE — Progress Notes (Signed)
UR chart review completed.  

## 2013-10-26 MED ORDER — IBUPROFEN 600 MG PO TABS: 600.0000 mg | ORAL_TABLET | Freq: Four times a day (QID) | ORAL | Status: DC | PRN

## 2013-10-26 MED ORDER — OXYCODONE-ACETAMINOPHEN 5-325 MG PO TABS: 1.0000 | ORAL_TABLET | Freq: Four times a day (QID) | ORAL | Status: DC | PRN

## 2013-10-26 NOTE — Progress Notes (Signed)
Patient ID: Felicia Young, female   DOB: 01-03-1991, 23 y.o.   MRN: [id number] #3 afebrile BP normal for d/c

## 2013-10-26 NOTE — Discharge Summary (Signed)
NAMDiamantina Monks:  Tech, Jeraline                 ACCOUNT NO.:  [id number]  MEDICAL RECORD NO.:  [id number]  LOCATION:  9105                          FACILITY:  [facility]  PHYSICIAN:  Malachi Prohomas F. Ambrose MantleHenley, M.D. DATE OF BIRTH:  [birth year]  DATE OF ADMISSION:  10/22/2013 DATE OF DISCHARGE:  10/26/2013                              DISCHARGE SUMMARY   This patient was admitted for cervical ripening.  She already  received 1 dose of Cytotec, started on Pitocin on the morning after admission. Intrauterine pressure catheter was placed.  By 7:15 p.m. on the 22nd, the cervix was 2 cm, 80%.  By 8:53 p.m., cervix was 2 cm, 80%.  Dr. Senaida Oresichardson felt brow or face presentation because of slow progress throughout the day.  She elected to proceed with C-section.  She underwent a low-transverse cervical C-section by Dr. Senaida Oresichardson, delivery of a healthy infant.  Postoperatively, the patient did well, had good bowel and urinary function, ambulated well without difficulty, remained afebrile, and was ready for discharge on the third postpartum day.  Initial hemoglobin 11.8, hematocrit 35.3,  followup hemoglobin 9.6, hematocrit 28.6.  FINAL DIAGNOSIS:  Intrauterine pregnancy at 41 weeks, brow presentation.  OPERATION:  Low transverse cervical C-section.  FINAL CONDITION:  Improved.  INSTRUCTIONS:  Include our regular discharge instruction booklet with after-visit summary.  Prescriptions for Motrin 600 mg 30 tablets, 1 every 6 hours as needed for pain and Percocet 5/325, 30 tablets, 1 every 6 hours as needed for pain.  She is to resume her Zoloft and Lamictal. Continue prenatal vitamins.  Possibly take 1 iron tablet a day and return to the office in 2 weeks for followup examination.     Malachi Prohomas F. Ambrose MantleHenley, M.D.     TFH/MEDQ  D:  10/26/2013  T:  10/26/2013  Job:  [id number]

## 2013-10-26 NOTE — Discharge Instructions (Signed)
booklet °

## 2014-02-03 ENCOUNTER — Encounter (HOSPITAL_COMMUNITY): Payer: Self-pay | Admitting: Obstetrics and Gynecology

## 2015-01-21 ENCOUNTER — Ambulatory Visit (INDEPENDENT_AMBULATORY_CARE_PROVIDER_SITE_OTHER): Payer: BLUE CROSS/BLUE SHIELD | Admitting: Internal Medicine

## 2015-01-21 ENCOUNTER — Other Ambulatory Visit (INDEPENDENT_AMBULATORY_CARE_PROVIDER_SITE_OTHER): Payer: BLUE CROSS/BLUE SHIELD

## 2015-01-21 ENCOUNTER — Encounter: Payer: Self-pay | Admitting: Internal Medicine

## 2015-01-21 VITALS — BP 118/90 | HR 90 | Temp 98.0°F | Ht 70.0 in | Wt 205.2 lb

## 2015-01-21 DIAGNOSIS — R002 Palpitations: Secondary | ICD-10-CM | POA: Diagnosis not present

## 2015-01-21 DIAGNOSIS — R61 Generalized hyperhidrosis: Secondary | ICD-10-CM | POA: Diagnosis not present

## 2015-01-21 LAB — CBC WITH DIFFERENTIAL/PLATELET
Basophils Absolute: 0 10*3/uL (ref 0.0–0.1)
Basophils Relative: 0.3 % (ref 0.0–3.0)
EOS PCT: 0.6 % (ref 0.0–5.0)
Eosinophils Absolute: 0.1 10*3/uL (ref 0.0–0.7)
HCT: 39.9 % (ref 36.0–46.0)
Hemoglobin: 13.6 g/dL (ref 12.0–15.0)
LYMPHS ABS: 2.5 10*3/uL (ref 0.7–4.0)
Lymphocytes Relative: 26.8 % (ref 12.0–46.0)
MCHC: 34 g/dL (ref 30.0–36.0)
MCV: 88.4 fl (ref 78.0–100.0)
MONOS PCT: 7.4 % (ref 3.0–12.0)
Monocytes Absolute: 0.7 10*3/uL (ref 0.1–1.0)
NEUTROS ABS: 5.9 10*3/uL (ref 1.4–7.7)
Neutrophils Relative %: 64.9 % (ref 43.0–77.0)
PLATELETS: 269 10*3/uL (ref 150.0–400.0)
RBC: 4.51 Mil/uL (ref 3.87–5.11)
RDW: 12.4 % (ref 11.5–15.5)
WBC: 9.1 10*3/uL (ref 4.0–10.5)

## 2015-01-21 LAB — HEPATIC FUNCTION PANEL
ALT: 15 U/L (ref 0–35)
AST: 12 U/L (ref 0–37)
Albumin: 4.1 g/dL (ref 3.5–5.2)
Alkaline Phosphatase: 42 U/L (ref 39–117)
Bilirubin, Direct: 0.1 mg/dL (ref 0.0–0.3)
Total Bilirubin: 0.4 mg/dL (ref 0.2–1.2)
Total Protein: 7.6 g/dL (ref 6.0–8.3)

## 2015-01-21 LAB — BASIC METABOLIC PANEL
BUN: 13 mg/dL (ref 6–23)
CALCIUM: 9.6 mg/dL (ref 8.4–10.5)
CO2: 25 meq/L (ref 19–32)
Chloride: 105 mEq/L (ref 96–112)
Creatinine, Ser: 0.64 mg/dL (ref 0.40–1.20)
GFR: 120.24 mL/min (ref >60.00)
GLUCOSE: 82 mg/dL (ref 70–99)
POTASSIUM: 4.4 meq/L (ref 3.5–5.1)
Sodium: 138 mEq/L (ref 135–145)

## 2015-01-21 LAB — T4, FREE: Free T4: 0.81 ng/dL (ref 0.60–1.60)

## 2015-01-21 LAB — FERRITIN: Ferritin: 24.9 ng/mL (ref 10.0–291.0)

## 2015-01-21 LAB — VITAMIN D 25 HYDROXY (VIT D DEFICIENCY, FRACTURES): VITD: 27.76 ng/mL — AB (ref 30.00–100.00)

## 2015-01-21 LAB — TSH: TSH: 2.06 u[IU]/mL (ref 0.35–4.50)

## 2015-01-21 NOTE — Progress Notes (Signed)
Subjective:    Patient ID: Felicia Young, female    DOB: 02/02/1991, 24 y.o.   MRN: [id number]  HPI  Patient here for sweating and "hot flashes" symptoms occur daily, triggered by any minimal exertion - Onset 01/2014, few months after birth of dtr 10/2013 associated with low libido, palpitations/fluttering, inability to lose weight Denies tremor, disruption of sleep, unintended weight loss, fever Denies med changes - on OCP since 6 week post partum (remote tx with OCP as teenager for PCOS did not caused adverse side effects) No increase in anxiety or change in emotional symptoms   Past Medical History  Diagnosis Date  . Generalized anxiety disorder   . ALLERGIC RHINITIS     seasonal  . Exercise-induced asthma   . Migraine   . Dyslipidemia 01/11/2013    CPX 01/2013 dx - Recommend diet/aerobic exercise changes with weight reduction  . PCOS (polycystic ovarian syndrome)   . Depression    Family History  Problem Relation Age of Onset  . Arthritis Mother   . Arthritis Father   . Alcohol abuse Maternal Grandmother   . Hyperlipidemia Maternal Grandmother   . Stroke Maternal Grandmother   . Hyperlipidemia Maternal Grandfather   . Hyperlipidemia Paternal Grandmother   . Hyperlipidemia Paternal Grandfather   . Hypertension Paternal Grandfather    Social History  Substance Use Topics  . Smoking status: Never Smoker   . Smokeless tobacco: None  . Alcohol Use: No   Review of Systems  Constitutional: Positive for fatigue. Negative for unexpected weight change.  Respiratory: Negative for cough, chest tightness and shortness of breath.   Cardiovascular: Positive for palpitations. Negative for chest pain and leg swelling.  Endocrine: Positive for heat intolerance. Negative for polydipsia, polyphagia and polyuria.       Objective:    Physical Exam  Constitutional: She is oriented to person, place, and time. She appears well-developed and well-nourished. No distress.    overweight  Cardiovascular: Normal rate, regular rhythm and normal heart sounds.   No murmur heard. Pulmonary/Chest: Effort normal and breath sounds normal. No respiratory distress.  Musculoskeletal: She exhibits no edema.  Neurological: She is alert and oriented to person, place, and time. She has normal reflexes.  Vitals reviewed.   BP 118/90 mmHg  Pulse 90  Temp(Src) 98 F (36.7 C) (Oral)  Ht 5\' 10"  (1.778 m)  Wt 205 lb 4 oz (93.101 kg)  BMI 29.45 kg/m2  SpO2 96%  LMP 01/07/2015  Breastfeeding? No Wt Readings from Last 3 Encounters:  01/21/15 205 lb 4 oz (93.101 kg)  10/22/13 242 lb (109.77 kg)  01/10/13 189 lb 12.8 oz (86.093 kg)    Lab Results  Component Value Date   WBC 14.3* 10/24/2013   HGB 9.6* 10/24/2013   HCT 28.6* 10/24/2013   PLT 140* 10/24/2013   GLUCOSE 119* 10/22/2013   CHOL 248* 01/10/2013   TRIG 272.0* 01/10/2013   HDL 49.90 01/10/2013   LDLDIRECT 164.7 01/10/2013   ALT 12 10/22/2013   AST 18 10/22/2013   NA 135* 10/22/2013   K 4.1 10/22/2013   CL 100 10/22/2013   CREATININE 0.42* 10/22/2013   BUN 8 10/22/2013   CO2 19 10/22/2013   TSH 2.88 01/10/2013    No results found.     Assessment & Plan:   Excessive sweating. Associated with mild palpitations Symptoms ongoing since initiation of oral contraceptive following 6 week postpartum visit Check labs to exclude other metabolic abnormality that may be present If labs  normal, advise consideration of alternate oral contraceptive to be managed by gynecologist -patient understands and agrees to same No other medical red flags suggesting other issue, but if unimproved with medication changes in the face of normal labs, patient will follow-up as needed, sooner if red flags such as unintentional weight loss  Problem List Items Addressed This Visit    None    Visit Diagnoses    Palpitations    -  Primary    Excessive sweating            Rene Paci, MD

## 2015-01-21 NOTE — Progress Notes (Signed)
Pre visit review using our clinic review tool, if applicable. No additional management support is needed unless otherwise documented below in the visit note. 

## 2015-01-21 NOTE — Patient Instructions (Signed)
It was good to see you today.  We have reviewed your prior records including labs and tests today  Test(s) ordered today. Your results will be released to MyChart (or called to you) after review, usually within 72hours after test completion. If any changes need to be made, you will be notified at that same time.  Medications reviewed and updated, no changes recommended at this time.  If all labs are within normal range, I would advise change in type of oral contraceptive pill, to be managed and directed by her gynecologist.  If continued symptoms despite normal labs and alternate birth control, or if new unintended weight loss, please follow up with this office for reevaluation

## 2015-05-29 ENCOUNTER — Encounter: Payer: Self-pay | Admitting: Family

## 2015-05-29 ENCOUNTER — Ambulatory Visit (INDEPENDENT_AMBULATORY_CARE_PROVIDER_SITE_OTHER): Payer: BLUE CROSS/BLUE SHIELD | Admitting: Family

## 2015-05-29 VITALS — BP 120/82 | HR 95 | Temp 98.3°F | Resp 18 | Wt 204.0 lb

## 2015-05-29 DIAGNOSIS — M7051 Other bursitis of knee, right knee: Secondary | ICD-10-CM | POA: Diagnosis not present

## 2015-05-29 MED ORDER — DICLOFENAC SODIUM 2 % TD SOLN: 1.0000 "application " | Freq: Two times a day (BID) | TRANSDERMAL | Status: DC | PRN

## 2015-05-29 MED ORDER — IBUPROFEN-FAMOTIDINE 800-26.6 MG PO TABS: 1.0000 | ORAL_TABLET | Freq: Three times a day (TID) | ORAL | Status: DC | PRN

## 2015-05-29 NOTE — Progress Notes (Signed)
Pre visit review using our clinic review tool, if applicable. No additional management support is needed unless otherwise documented below in the visit note. 

## 2015-05-29 NOTE — Patient Instructions (Addendum)
Thank you for choosing [facility].  Summary/Instructions:  Ice 2-3 times per day or after activity Home exercises daily Knee sleeve for compression.    Your prescription(s) have been submitted to your pharmacy or been printed and provided for you. Please take as directed and contact our office if you believe you are having problem(s) with the medication(s) or have any questions.  If your symptoms worsen or fail to improve, please contact our office for further instruction, or in case of emergency go directly to the emergency room at the closest medical facility.    Prepatellar Bursitis With Rehab  Bursitis is a condition that is characterized by inflammation of a bursa. Kateri Mc exists in many areas of the body. They are fluid-filled sacs that lie between a soft tissue (skin, tendon, or ligament) and a bone, and they reduce friction between the structures as well as the stress placed on the soft tissue. Prepatellar bursitis is inflammation of the bursa that lies between the skin and the kneecap (patella). This condition often causes pain over the patella. SYMPTOMS   Pain, tenderness, and/or inflammation over the patella.  Pain that worsens with movement of the knee joint.  Decreased range of motion for the knee joint.  A crackling sound (crepitation) when the bursa is moved or touched.  Occasionally, painless swelling of the bursa.  Fever (when infected). CAUSES  Bursitis is caused by damage to the bursa, which results in an inflammatory response. Common mechanisms of injury include:  Direct trauma to the front of the knee.  Repetitive and/or stressful use of the knee. RISK INCREASES WITH:  Activities in which kneeling and/or falling on one's knees is likely (volleyball or football).  Repetitive and stressful training, especially if it involves running on hills.  Improper training techniques, such as a sudden increase in the intensity, frequency, or duration of  training.  Failure to warm up properly before activity.  Poor technique.  Artificial turf. PREVENTION   Avoid kneeling or falling on your knees.  Warm up and stretch properly before activity.  Allow for adequate recovery between workouts.  Maintain physical fitness:  Strength, flexibility, and endurance.  Cardiovascular fitness.  Learn and use proper technique. When possible, have a coach correct improper technique.  Wear properly fitted and padded protective equipment (knee pads). PROGNOSIS  If treated properly, then the symptoms of prepatellar bursitis usually resolve within 2 weeks. RELATED COMPLICATIONS   Recurrent symptoms that result in a chronic problem.  Prolonged healing time, if improperly treated or reinjured.  Limited range of motion.  Infection of bursa.  Chronic inflammation or scarring of bursa. TREATMENT  Treatment initially involves the use of ice and medication to help reduce pain and inflammation. The use of strengthening and stretching exercises may help reduce pain with activity, especially those of the quadriceps and hamstring muscles. These exercises may be performed at home or with referral to a therapist. Your caregiver may recommend knee pads when you return to playing sports, in order to reduce the stress on the prepatellar bursa. If symptoms persist despite treatment, then your caregiver may drain fluid out with a needle (aspirate) the bursa. If symptoms persist for greater than 6 months despite nonsurgical (conservative) treatment, then surgery may be recommended to remove the bursa.  MEDICATION  If pain medication is necessary, then nonsteroidal anti-inflammatory medications, such as aspirin and ibuprofen, or other minor pain relievers, such as acetaminophen, are often recommended.  Do not take pain medication for 7 days before surgery.  Prescription pain relievers may be given if deemed necessary by your caregiver. Use only as directed and  only as much as you need.  Corticosteroid injections may be given by your caregiver. These injections should be reserved for the most serious cases, because they may only be given a certain number of times. HEAT AND COLD  Cold treatment (icing) relieves pain and reduces inflammation. Cold treatment should be applied for 10 to 15 minutes every 2 to 3 hours for inflammation and pain and immediately after any activity that aggravates your symptoms. Use ice packs or massage the area with a piece of ice (ice massage).  Heat treatment may be used prior to performing the stretching and strengthening activities prescribed by your caregiver, physical therapist, or athletic trainer. Use a heat pack or soak the injury in warm water. SEEK MEDICAL CARE IF:  Treatment seems to offer no benefit, or the condition worsens.  Any medications produce adverse side effects. EXERCISES RANGE OF MOTION (ROM) AND STRETCHING EXERCISES - Prepatellar Bursitis These exercises may help you when beginning to rehabilitate your injury. Your symptoms may resolve with or without further involvement from your physician, physical therapist or athletic trainer. While completing these exercises, remember:   Restoring tissue flexibility helps normal motion to return to the joints. This allows healthier, less painful movement and activity.  An effective stretch should be held for at least 30 seconds.  A stretch should never be painful. You should only feel a gentle lengthening or release in the stretched tissue. STRETCH - Hamstrings, Standing  Stand or sit and extend your right / left leg, placing your foot on a chair or foot stool  Keeping a slight arch in your low back and your hips straight forward.  Lead with your chest and lean forward at the waist until you feel a gentle stretch in the back of your right / left knee or thigh. (When done correctly, this exercise requires leaning only a small distance.)  Hold this position  for __________ seconds. Repeat __________ times. Complete this stretch __________ times per day. STRETCH - Quadriceps, Prone   Lie on your stomach on a firm surface, such as a bed or padded floor.  Bend your right / left knee and grasp your ankle. If you are unable to reach, your ankle or pant leg, use a belt around your foot to lengthen your reach.  Gently pull your heel toward your buttocks. Your knee should not slide out to the side. You should feel a stretch in the front of your thigh and/or knee.  Hold this position for __________ seconds. Repeat __________ times. Complete this stretch __________ times per day.  STRETCH - Hamstrings/Adductors, V-Sit   Sit on the floor with your legs extended in a large "V," keeping your knees straight.  With your head and chest upright, bend at your waist reaching for your right foot to stretch your left adductors.  You should feel a stretch in your left inner thigh. Hold for __________ seconds.  Return to the upright position to relax your leg muscles.  Continuing to keep your chest upright, bend straight forward at your waist to stretch your hamstrings.  You should feel a stretch behind both of your thighs and/or knees. Hold for __________ seconds.  Return to the upright position to relax your leg muscles.  Repeat steps 2 through 4. Repeat __________ times. Complete this exercise __________ times per day.  STRENGTHENING EXERCISES - Prepatellar Bursitis  These exercises may help you when  beginning to rehabilitate your injury. They may resolve your symptoms with or without further involvement from your physician, physical therapist or athletic trainer. While completing these exercises, remember:  Muscles can gain both the endurance and the strength needed for everyday activities through controlled exercises.  Complete these exercises as instructed by your physician, physical therapist or athletic trainer. Progress the resistance and  repetitions only as guided. STRENGTH - Quadriceps, Isometrics  Lie on your back with your right / left leg extended and your opposite knee bent.  Gradually tense the muscles in the front of your right / left thigh. You should see either your kneecap slide up toward your hip or increased dimpling just above the knee. This motion will push the back of the knee down toward the floor/mat/bed on which you are lying.  Hold the muscle as tight as you can without increasing your pain for __________ seconds.  Relax the muscles slowly and completely in between each repetition. Repeat __________ times. Complete this exercise __________ times per day.  STRENGTH - Quadriceps, Short Arcs   Lie on your back. Place a __________ inch towel roll under your knee so that the knee slightly bends.  Raise only your lower leg by tightening the muscles in the front of your thigh. Do not allow your thigh to rise.  Hold this position for __________ seconds. Repeat __________ times. Complete this exercise __________ times per day.  OPTIONAL ANKLE WEIGHTS: Begin with ____________________, but DO NOT exceed ____________________. Increase in1 lb/0.5 kg increments.  STRENGTH - Quadriceps, Straight Leg Raises  Quality counts! Watch for signs that the quadriceps muscle is working to insure you are strengthening the correct muscles and not "cheating" by substituting with healthier muscles.  Lay on your back with your right / left leg extended and your opposite knee bent.  Tense the muscles in the front of your right / left thigh. You should see either your kneecap slide up or increased dimpling just above the knee. Your thigh may even quiver.  Tighten these muscles even more and raise your leg 4 to 6 inches off the floor. Hold for __________ seconds.  Keeping these muscles tense, lower your leg.  Relax the muscles slowly and completely in between each repetition. Repeat __________ times. Complete this exercise  __________ times per day.  STRENGTH - Quadriceps, Step-Ups   Use a thick book, step or step stool that is __________ inches tall.  Holding a wall or counter for balance only, not support.  Slowly step-up with your right / left foot, keeping your knee in line with your hip and foot. Do not allow your knee to bend so far that you cannot see your toes.  Slowly unlock your knee and lower yourself to the starting position. Your muscles, not gravity, should lower you. Repeat __________ times. Complete this exercise __________ times per day.   This information is not intended to replace advice given to you by your health care provider. Make sure you discuss any questions you have with your health care provider.   Document Released: 03/21/2005 Document Revised: 12/10/2014 Document Reviewed: 07/03/2008 Elsevier Interactive Patient Education [employer]

## 2015-05-29 NOTE — Progress Notes (Signed)
Subjective:    Patient ID: Felicia Young, female    DOB: 1990/05/17, 25 y.o.   MRN: [id number]  Chief Complaint  Patient presents with  . Knee Pain    has what looked like a bouncy ball in her right knee, when she exercises it starts to hurt and her legs feel heavy    HPI:  Felicia Young is a 25 y.o. female who  has a past medical history of Generalized anxiety disorder; ALLERGIC RHINITIS; Exercise-induced asthma; Migraine; Dyslipidemia (01/11/2013); PCOS (polycystic ovarian syndrome); and Depression. and presents today fir an office visit.   This is a new problem. Associated symptom of pain located in her knee has been going on for about 1 month following being on a stationary bike when she noted that her knee felt different. About 3 weeks ago she noted that she had some swelling that she describes as a "bouncy ball" under her skin. Modifying factors include icing which did help a little. During a recent walk she experienced pain and heaviness. She was pregnant about 1.5 years ago. Pain is located generalized and slightly more lateral. No numbness or tingling. Does have occasional cracking sound but no other sounds/sensations noted.   No Known Allergies   Current Outpatient Prescriptions on File Prior to Visit  Medication Sig Dispense Refill  . albuterol (PROVENTIL HFA;VENTOLIN HFA) 108 (90 BASE) MCG/ACT inhaler Inhale 2 puffs into the lungs every 6 (six) hours as needed for wheezing. 1 Inhaler 0  . ibuprofen (ADVIL,MOTRIN) 600 MG tablet Take 1 tablet (600 mg total) by mouth every 6 (six) hours as needed. 30 tablet 0  . lamoTRIgine (LAMICTAL) 100 MG tablet Take 100 mg by mouth daily.    . sertraline (ZOLOFT) 100 MG tablet Take 50 mg by mouth daily.      No current facility-administered medications on file prior to visit.   Past Medical History  Diagnosis Date  . Generalized anxiety disorder   . ALLERGIC RHINITIS     seasonal  . Exercise-induced asthma   . Migraine   .  Dyslipidemia 01/11/2013    CPX 01/2013 dx - Recommend diet/aerobic exercise changes with weight reduction  . PCOS (polycystic ovarian syndrome)   . Depression      Review of Systems  Constitutional: Negative for fever and chills.  Musculoskeletal:       Positive for right knee pain.   Neurological: Negative for weakness and numbness.      Objective:    BP 120/82 mmHg  Pulse 95  Temp(Src) 98.3 F (36.8 C) (Oral)  Resp 18  Wt 204 lb (92.534 kg)  SpO2 96% Nursing note and vital signs reviewed.  Physical Exam  Constitutional: She is oriented to person, place, and time. She appears well-developed and well-nourished. No distress.  Cardiovascular: Normal rate, regular rhythm, normal heart sounds and intact distal pulses.   Pulmonary/Chest: Effort normal and breath sounds normal.  Musculoskeletal:  Right knee - no obvious discoloration or deformity. Mild edema located around infrapatellar bursa. Mild tenderness. Range of motion is normal. Strength is 5+. Ligamentous and meniscal testing are negative. Distal pulses and sensation are intact and appropriate.  Neurological: She is alert and oriented to person, place, and time.  Skin: Skin is warm and dry.  Psychiatric: She has a normal mood and affect. Her behavior is normal. Judgment and thought content normal.       Assessment & Plan:   Problem List Items Addressed This Visit  Musculoskeletal and Integument   Infrapatellar bursitis of right knee - Primary    Symptoms and exam consistent with infrapatellar bursitis. Start Duexis and Pennsaid. Ice and home exercise therapy. Knee sleeve as needed. Follow up in 2 weeks or sooner if needed.       Relevant Medications   Diclofenac Sodium (PENNSAID) 2 % SOLN   Ibuprofen-Famotidine 800-26.6 MG TABS

## 2015-05-29 NOTE — Assessment & Plan Note (Signed)
Symptoms and exam consistent with infrapatellar bursitis. Start Duexis and Pennsaid. Ice and home exercise therapy. Knee sleeve as needed. Follow up in 2 weeks or sooner if needed.

## 2015-10-29 DIAGNOSIS — F319 Bipolar disorder, unspecified: Secondary | ICD-10-CM | POA: Diagnosis not present

## 2016-03-11 DIAGNOSIS — N979 Female infertility, unspecified: Secondary | ICD-10-CM | POA: Diagnosis not present

## 2016-03-16 DIAGNOSIS — Z3201 Encounter for pregnancy test, result positive: Secondary | ICD-10-CM | POA: Diagnosis not present

## 2016-03-18 DIAGNOSIS — Z3201 Encounter for pregnancy test, result positive: Secondary | ICD-10-CM | POA: Diagnosis not present

## 2016-04-27 LAB — OB RESULTS CONSOLE HEPATITIS B SURFACE ANTIGEN: HEP B S AG: NEGATIVE

## 2016-04-27 LAB — OB RESULTS CONSOLE GC/CHLAMYDIA
Chlamydia: NEGATIVE
Gonorrhea: NEGATIVE

## 2016-04-27 LAB — OB RESULTS CONSOLE HIV ANTIBODY (ROUTINE TESTING): HIV: NONREACTIVE

## 2016-04-27 LAB — OB RESULTS CONSOLE RPR: RPR: NONREACTIVE

## 2016-04-27 LAB — OB RESULTS CONSOLE RUBELLA ANTIBODY, IGM: Rubella: IMMUNE

## 2016-08-04 ENCOUNTER — Other Ambulatory Visit (HOSPITAL_COMMUNITY): Payer: Self-pay | Admitting: Obstetrics and Gynecology

## 2016-08-04 DIAGNOSIS — Z3689 Encounter for other specified antenatal screening: Secondary | ICD-10-CM

## 2016-08-23 ENCOUNTER — Other Ambulatory Visit (HOSPITAL_COMMUNITY): Payer: Self-pay | Admitting: *Deleted

## 2016-08-23 ENCOUNTER — Ambulatory Visit (HOSPITAL_COMMUNITY)
Admission: RE | Admit: 2016-08-23 | Discharge: 2016-08-23 | Disposition: A | Discharge status: Home / Self Care | Payer: 59 | Source: Ambulatory Visit | Attending: Obstetrics and Gynecology | Admitting: Obstetrics and Gynecology

## 2016-08-23 DIAGNOSIS — Z0489 Encounter for examination and observation for other specified reasons: Secondary | ICD-10-CM

## 2016-08-23 DIAGNOSIS — Z3A26 26 weeks gestation of pregnancy: Secondary | ICD-10-CM | POA: Diagnosis not present

## 2016-08-23 DIAGNOSIS — O34219 Maternal care for unspecified type scar from previous cesarean delivery: Secondary | ICD-10-CM | POA: Diagnosis not present

## 2016-08-23 DIAGNOSIS — Z3689 Encounter for other specified antenatal screening: Secondary | ICD-10-CM | POA: Diagnosis not present

## 2016-08-23 DIAGNOSIS — O99212 Obesity complicating pregnancy, second trimester: Secondary | ICD-10-CM | POA: Insufficient documentation

## 2016-08-23 DIAGNOSIS — IMO0002 Reserved for concepts with insufficient information to code with codable children: Secondary | ICD-10-CM

## 2016-08-23 NOTE — Addendum Note (Signed)
Encounter addended by: Genevie CheshireWaken, Jenelle Drennon M, RT on: 08/23/2016  4:24 PM<BR>    Actions taken: Imaging Exam ended

## 2016-09-20 ENCOUNTER — Other Ambulatory Visit (HOSPITAL_COMMUNITY): Payer: Self-pay | Admitting: Obstetrics and Gynecology

## 2016-09-20 ENCOUNTER — Ambulatory Visit (HOSPITAL_COMMUNITY)
Admission: RE | Admit: 2016-09-20 | Discharge: 2016-09-20 | Disposition: A | Discharge status: Home / Self Care | Payer: 59 | Source: Ambulatory Visit | Attending: Obstetrics and Gynecology | Admitting: Obstetrics and Gynecology

## 2016-09-20 DIAGNOSIS — Z048 Encounter for examination and observation for other specified reasons: Secondary | ICD-10-CM | POA: Diagnosis not present

## 2016-09-20 DIAGNOSIS — O34219 Maternal care for unspecified type scar from previous cesarean delivery: Secondary | ICD-10-CM

## 2016-09-20 DIAGNOSIS — Z3A30 30 weeks gestation of pregnancy: Secondary | ICD-10-CM | POA: Diagnosis not present

## 2016-09-20 DIAGNOSIS — Z0489 Encounter for examination and observation for other specified reasons: Secondary | ICD-10-CM

## 2016-09-20 DIAGNOSIS — IMO0002 Reserved for concepts with insufficient information to code with codable children: Secondary | ICD-10-CM

## 2016-09-20 DIAGNOSIS — O99213 Obesity complicating pregnancy, third trimester: Secondary | ICD-10-CM

## 2016-10-19 ENCOUNTER — Encounter (HOSPITAL_COMMUNITY): Payer: Self-pay

## 2016-10-21 LAB — OB RESULTS CONSOLE GBS: STREP GROUP B AG: NEGATIVE

## 2016-11-03 ENCOUNTER — Encounter (HOSPITAL_COMMUNITY): Payer: Self-pay

## 2016-11-16 NOTE — Patient Instructions (Signed)
[address] Cory[address]air L Klemens  11/16/2016   Your procedure is scheduled on:  11/17/2016  [facility] through the [facility] Entr[facility] of [facility] at 0530 AM.  Pick up the phone at the desk and dial [phone].   Call this number if you have problems the morning of surgery: [phone]   Remember:   Do not eat food:After Midnight.  Do not drink clear liquids: After Midnight.  Take these medicines the morning of surgery with A SIP OF WATER: may take your zoloft if you take it in the morning usually.  Please bring your inhaler with you.   Do not wear jewelry, make-up or nail polish.  Do not wear lotions, powders, or perfumes. Do not wear deodorant.  Do not shave 48 hours prior to surgery.  Do not bring valuables to the hospital.  [facility] is not   responsible for any belongings or valuables brought to the hospital.  Contacts, dentures or bridgework may not be worn into surgery.  Leave suitcase in the car. After surgery it may be brought to your room.  For patients admitted to the hospital, checkout time is 11:00 AM the day of              discharge.   Patients discharged the day of surgery will not be allowed to drive             home.  Name and phone number of your driver: na  Special Instructions:   N/A   Please read over the following fact sheets that you were given:   Surgical Site Infection Prevention

## 2016-11-17 ENCOUNTER — Encounter (HOSPITAL_COMMUNITY)
Admission: RE | Admit: 2016-11-17 | Discharge: 2016-11-17 | Disposition: A | Discharge status: Home / Self Care | Payer: 59 | Source: Ambulatory Visit | Attending: Obstetrics and Gynecology | Admitting: Obstetrics and Gynecology

## 2016-11-17 LAB — CBC
HCT: 35.1 % — ABNORMAL LOW (ref 36.0–46.0)
Hemoglobin: 11.6 g/dL — ABNORMAL LOW (ref 12.0–15.0)
MCH: 29.9 pg (ref 26.0–34.0)
MCHC: 33 g/dL (ref 30.0–36.0)
MCV: 90.5 fL (ref 78.0–100.0)
PLATELETS: 172 10*3/uL (ref 150–400)
RBC: 3.88 MIL/uL (ref 3.87–5.11)
RDW: 14.5 % (ref 11.5–15.5)
WBC: 8.6 10*3/uL (ref 4.0–10.5)

## 2016-11-17 LAB — TYPE AND SCREEN
ABO/RH(D): O POS
ANTIBODY SCREEN: NEGATIVE

## 2016-11-17 LAB — ABO/RH: ABO/RH(D): O POS

## 2016-11-17 NOTE — H&P (Signed)
Felicia Young is a 26 y.o. female G2P1001 at 4739 1/7 weeks (EDD 11/24/16 by LMP c/w 6 week US) presenting for scheduled repeat c-section. Prenatal course relatively uneventful except for the prior c-section, declines VBAC.  Also, h/o depressive disorder, stable on lamictal and zoloft--she elected to continue after careful consideration of pluses/minuses.  OB History    Gravida Para Term Preterm AB Living   2 1 1          SAB TAB Ectopic Multiple Live Births                LTCS 2015 8#11oz  Past Medical History:  Diagnosis Date  . ALLERGIC RHINITIS    seasonal  . Depression   . Dyslipidemia 01/11/2013   CPX 01/2013 dx - Recommend diet/aerobic exercise changes with weight reduction  . Exercise-induced asthma   . Generalized anxiety disorder   . Migraine   . PCOS (polycystic ovarian syndrome)    Past Surgical History:  Procedure Laterality Date  . CESAREAN SECTION N/A 10/23/2013   Procedure: Primary Cesarean Section Delivery Baby Girl @ 22 08, Apgars 7/9;  Surgeon: Oliver PilaKathy W Charolette Bultman, MD;  Location: [facility];  Service: Obstetrics;  Laterality: N/A;  . ORIF RADIAL FRACTURE Left 1999  . WISDOM TOOTH EXTRACTION  2011   Family History: family history includes Alcohol abuse in her maternal grandmother; Arthritis in her father and mother; Hyperlipidemia in her maternal grandfather, maternal grandmother, paternal grandfather, and paternal grandmother; Hypertension in her paternal grandfather; Stroke in her maternal grandmother. Social History:  reports that she has never smoked. She has never used smokeless tobacco. She reports that she does not drink alcohol or use drugs.     Maternal Diabetes: No Genetic Screening: Normal Maternal Ultrasounds/Referrals: Normal Fetal Ultrasounds or other Referrals:  None Maternal Substance Abuse:  No Significant Maternal Medications:  Meds include: Zoloft, lamictal Significant Maternal Lab Results:  None Other Comments:  None  Review of Systems   Gastrointestinal: Negative for abdominal pain.   Maternal Medical History:  Contractions: Frequency: irregular.   Perceived severity is mild.    Fetal activity: Perceived fetal activity is normal.    Prenatal Complications - Diabetes: none.      Last menstrual period 02/18/2016. Maternal Exam:  Uterine Assessment: Contraction strength is mild.  Contraction frequency is irregular.   Abdomen: Patient reports no abdominal tenderness. Surgical scars: low transverse.   Fetal presentation: vertex  Introitus: Normal vulva. Normal vagina.    Physical Exam  Constitutional: She appears well-developed and well-nourished.  Cardiovascular: Normal rate and regular rhythm.   Respiratory: Effort normal and breath sounds normal.  GI: Soft.  Genitourinary: Vagina normal.  Neurological: She is alert.  Psychiatric: She has a normal mood and affect.    Prenatal labs: ABO, Rh: --/--/O POS (08/16 1000) Antibody: NEG (08/16 1000) Rubella: Immune (01/24 0000) RPR: Nonreactive (01/24 0000)  HBsAg: Negative (01/24 0000)  HIV: Non-reactive (01/24 0000)  GBS: Negative (07/20 0000)  One hour GCT 76 First trimester screen and AFP negative Essential panel negative  Assessment/Plan:  d/w pt c-section process in detail as well as risks and benefits. Reviewed wit her risks of bleeding, infection and possible damage to bowel and bladder.  Ready to proceed   Oliver PilaKathy W Nyron Mozer 11/17/2016, 11:31 AM

## 2016-11-18 ENCOUNTER — Inpatient Hospital Stay (HOSPITAL_COMMUNITY)
Admission: RE | Admit: 2016-11-18 | Discharge: 2016-11-22 | DRG: 765 | Disposition: A | Discharge status: Home / Self Care | Payer: 59 | Source: Ambulatory Visit | Attending: Obstetrics and Gynecology | Admitting: Obstetrics and Gynecology

## 2016-11-18 ENCOUNTER — Encounter (HOSPITAL_COMMUNITY)
Admission: RE | Disposition: A | Discharge status: Home / Self Care | Payer: Self-pay | Source: Ambulatory Visit | Attending: Obstetrics and Gynecology

## 2016-11-18 ENCOUNTER — Inpatient Hospital Stay (HOSPITAL_COMMUNITY): Payer: 59 | Admitting: Anesthesiology

## 2016-11-18 ENCOUNTER — Encounter (HOSPITAL_COMMUNITY): Payer: Self-pay

## 2016-11-18 DIAGNOSIS — O34211 Maternal care for low transverse scar from previous cesarean delivery: Secondary | ICD-10-CM | POA: Diagnosis present

## 2016-11-18 DIAGNOSIS — F329 Major depressive disorder, single episode, unspecified: Secondary | ICD-10-CM | POA: Diagnosis present

## 2016-11-18 DIAGNOSIS — O403XX0 Polyhydramnios, third trimester, not applicable or unspecified: Secondary | ICD-10-CM | POA: Diagnosis present

## 2016-11-18 DIAGNOSIS — Z3A39 39 weeks gestation of pregnancy: Secondary | ICD-10-CM

## 2016-11-18 DIAGNOSIS — O99344 Other mental disorders complicating childbirth: Secondary | ICD-10-CM | POA: Diagnosis present

## 2016-11-18 DIAGNOSIS — Z98891 History of uterine scar from previous surgery: Secondary | ICD-10-CM

## 2016-11-18 LAB — RPR: RPR: NONREACTIVE

## 2016-11-18 SURGERY — Surgical Case
Anesthesia: Regional

## 2016-11-18 MED ORDER — PRENATAL MULTIVITAMIN CH
1.0000 | ORAL_TABLET | Freq: Every day | ORAL | Status: DC
  Administered 2016-11-18 – 2016-11-22 (×4): 1 via ORAL
  Filled 2016-11-18 (×5): qty 1

## 2016-11-18 MED ORDER — LACTATED RINGERS IV SOLN
INTRAVENOUS | Status: DC
  Administered 2016-11-18 (×2): via INTRAVENOUS

## 2016-11-18 MED ORDER — FENTANYL CITRATE (PF) 100 MCG/2ML IJ SOLN
INTRAMUSCULAR | Status: AC
  Filled 2016-11-18: qty 2

## 2016-11-18 MED ORDER — OXYTOCIN 40 UNITS IN LACTATED RINGERS INFUSION - SIMPLE MED: 2.5000 [IU]/h | INTRAVENOUS | Status: AC

## 2016-11-18 MED ORDER — ONDANSETRON HCL 4 MG/2ML IJ SOLN
INTRAMUSCULAR | Status: AC
  Filled 2016-11-18: qty 2

## 2016-11-18 MED ORDER — SCOPOLAMINE 1 MG/3DAYS TD PT72
MEDICATED_PATCH | TRANSDERMAL | Status: AC
  Filled 2016-11-18: qty 1

## 2016-11-18 MED ORDER — DIPHENHYDRAMINE HCL 25 MG PO CAPS
25.0000 mg | ORAL_CAPSULE | Freq: Four times a day (QID) | ORAL | Status: DC | PRN
  Administered 2016-11-18: 25 mg via ORAL
  Filled 2016-11-18: qty 1

## 2016-11-18 MED ORDER — SIMETHICONE 80 MG PO CHEW
80.0000 mg | CHEWABLE_TABLET | Freq: Three times a day (TID) | ORAL | Status: DC
  Administered 2016-11-18 – 2016-11-22 (×9): 80 mg via ORAL
  Filled 2016-11-18 (×9): qty 1

## 2016-11-18 MED ORDER — COCONUT OIL OIL: 1.0000 "application " | TOPICAL_OIL | Status: DC | PRN

## 2016-11-18 MED ORDER — CEFAZOLIN SODIUM-DEXTROSE 2-4 GM/100ML-% IV SOLN
2.0000 g | INTRAVENOUS | Status: AC
  Administered 2016-11-18: 2 g via INTRAVENOUS

## 2016-11-18 MED ORDER — IBUPROFEN 600 MG PO TABS
600.0000 mg | ORAL_TABLET | Freq: Four times a day (QID) | ORAL | Status: DC
  Administered 2016-11-18 – 2016-11-22 (×17): 600 mg via ORAL
  Filled 2016-11-18 (×17): qty 1

## 2016-11-18 MED ORDER — TETANUS-DIPHTH-ACELL PERTUSSIS 5-2.5-18.5 LF-MCG/0.5 IM SUSP: 0.5000 mL | Freq: Once | INTRAMUSCULAR | Status: DC

## 2016-11-18 MED ORDER — WITCH HAZEL-GLYCERIN EX PADS: 1.0000 "application " | MEDICATED_PAD | CUTANEOUS | Status: DC | PRN

## 2016-11-18 MED ORDER — PHENYLEPHRINE 8 MG IN D5W 100 ML (0.08MG/ML) PREMIX OPTIME
INJECTION | INTRAVENOUS | Status: AC
  Filled 2016-11-18: qty 100

## 2016-11-18 MED ORDER — MENTHOL 3 MG MT LOZG: 1.0000 | LOZENGE | OROMUCOSAL | Status: DC | PRN

## 2016-11-18 MED ORDER — DIBUCAINE 1 % RE OINT: 1.0000 "application " | TOPICAL_OINTMENT | RECTAL | Status: DC | PRN

## 2016-11-18 MED ORDER — MORPHINE SULFATE (PF) 0.5 MG/ML IJ SOLN
INTRAMUSCULAR | Status: AC
  Filled 2016-11-18: qty 10

## 2016-11-18 MED ORDER — ALBUTEROL SULFATE (2.5 MG/3ML) 0.083% IN NEBU: 3.0000 mL | INHALATION_SOLUTION | Freq: Four times a day (QID) | RESPIRATORY_TRACT | Status: DC | PRN

## 2016-11-18 MED ORDER — SIMETHICONE 80 MG PO CHEW: 80.0000 mg | CHEWABLE_TABLET | ORAL | Status: DC | PRN

## 2016-11-18 MED ORDER — ONDANSETRON HCL 4 MG/2ML IJ SOLN: 4.0000 mg | Freq: Once | INTRAMUSCULAR | Status: DC | PRN

## 2016-11-18 MED ORDER — SODIUM CHLORIDE 0.9 % IR SOLN
Status: DC | PRN
  Administered 2016-11-18: 1

## 2016-11-18 MED ORDER — FENTANYL CITRATE (PF) 100 MCG/2ML IJ SOLN
INTRAMUSCULAR | Status: DC | PRN
  Administered 2016-11-18: 10 ug via INTRATHECAL

## 2016-11-18 MED ORDER — LACTATED RINGERS IV SOLN: INTRAVENOUS | Status: DC

## 2016-11-18 MED ORDER — SENNOSIDES-DOCUSATE SODIUM 8.6-50 MG PO TABS
2.0000 | ORAL_TABLET | ORAL | Status: DC
  Administered 2016-11-18 – 2016-11-22 (×4): 2 via ORAL
  Filled 2016-11-18 (×4): qty 2

## 2016-11-18 MED ORDER — ACETAMINOPHEN 325 MG PO TABS
650.0000 mg | ORAL_TABLET | ORAL | Status: DC | PRN
  Administered 2016-11-19 (×2): 650 mg via ORAL
  Filled 2016-11-18 (×2): qty 2

## 2016-11-18 MED ORDER — SIMETHICONE 80 MG PO CHEW
80.0000 mg | CHEWABLE_TABLET | ORAL | Status: DC
  Administered 2016-11-18 – 2016-11-22 (×4): 80 mg via ORAL
  Filled 2016-11-18 (×4): qty 1

## 2016-11-18 MED ORDER — ZOLPIDEM TARTRATE 5 MG PO TABS
5.0000 mg | ORAL_TABLET | Freq: Every evening | ORAL | Status: DC | PRN
  Administered 2016-11-19: 5 mg via ORAL
  Filled 2016-11-18: qty 1

## 2016-11-18 MED ORDER — BUPIVACAINE IN DEXTROSE 0.75-8.25 % IT SOLN
INTRATHECAL | Status: AC
  Filled 2016-11-18: qty 2

## 2016-11-18 MED ORDER — LACTATED RINGERS IV SOLN
INTRAVENOUS | Status: DC | PRN
  Administered 2016-11-18: 08:00:00 via INTRAVENOUS

## 2016-11-18 MED ORDER — MEPERIDINE HCL 25 MG/ML IJ SOLN: 6.2500 mg | INTRAMUSCULAR | Status: DC | PRN

## 2016-11-18 MED ORDER — PHENYLEPHRINE 8 MG IN D5W 100 ML (0.08MG/ML) PREMIX OPTIME
INJECTION | INTRAVENOUS | Status: DC | PRN
  Administered 2016-11-18: 60 ug/min via INTRAVENOUS

## 2016-11-18 MED ORDER — BUPIVACAINE IN DEXTROSE 0.75-8.25 % IT SOLN
INTRATHECAL | Status: DC | PRN
  Administered 2016-11-18: 12 mL via INTRATHECAL

## 2016-11-18 MED ORDER — MORPHINE SULFATE (PF) 0.5 MG/ML IJ SOLN
INTRAMUSCULAR | Status: DC | PRN
  Administered 2016-11-18: .2 mg via INTRATHECAL

## 2016-11-18 MED ORDER — OXYTOCIN 10 UNIT/ML IJ SOLN
INTRAVENOUS | Status: DC | PRN
  Administered 2016-11-18: 40 [IU] via INTRAVENOUS

## 2016-11-18 MED ORDER — ONDANSETRON HCL 4 MG/2ML IJ SOLN
INTRAMUSCULAR | Status: DC | PRN
Start: 2016-11-18 — End: 2016-11-18
  Administered 2016-11-18: 4 mg via INTRAVENOUS

## 2016-11-18 MED ORDER — FENTANYL CITRATE (PF) 100 MCG/2ML IJ SOLN: 25.0000 ug | INTRAMUSCULAR | Status: DC | PRN

## 2016-11-18 MED ORDER — LAMOTRIGINE 100 MG PO TABS
100.0000 mg | ORAL_TABLET | Freq: Every evening | ORAL | Status: DC
  Administered 2016-11-18 – 2016-11-21 (×4): 100 mg via ORAL
  Filled 2016-11-18 (×5): qty 1

## 2016-11-18 MED ORDER — OXYTOCIN 10 UNIT/ML IJ SOLN
INTRAMUSCULAR | Status: AC
  Filled 2016-11-18: qty 4

## 2016-11-18 MED ORDER — SERTRALINE HCL 100 MG PO TABS
100.0000 mg | ORAL_TABLET | Freq: Every evening | ORAL | Status: DC
  Administered 2016-11-18 – 2016-11-21 (×4): 100 mg via ORAL
  Filled 2016-11-18 (×5): qty 1

## 2016-11-18 SURGICAL SUPPLY — 34 items
BENZOIN TINCTURE PRP APPL 2/3 (GAUZE/BANDAGES/DRESSINGS) ×2 IMPLANT
CHLORAPREP W/TINT 26ML (MISCELLANEOUS) ×2 IMPLANT
CLAMP CORD UMBIL (MISCELLANEOUS) ×2 IMPLANT
CLOTH BEACON ORANGE TIMEOUT ST (SAFETY) ×2 IMPLANT
DRSG OPSITE POSTOP 4X10 (GAUZE/BANDAGES/DRESSINGS) ×2 IMPLANT
ELECT REM PT RETURN 9FT ADLT (ELECTROSURGICAL) ×2
ELECTRODE REM PT RTRN 9FT ADLT (ELECTROSURGICAL) ×1 IMPLANT
EXTRACTOR VACUUM KIWI (MISCELLANEOUS) IMPLANT
GLOVE BIO SURGEON STRL SZ 6.5 (GLOVE) ×2 IMPLANT
GLOVE BIOGEL PI IND STRL 7.0 (GLOVE) ×1 IMPLANT
GLOVE BIOGEL PI INDICATOR 7.0 (GLOVE) ×1
GOWN STRL REUS W/TWL LRG LVL3 (GOWN DISPOSABLE) ×4 IMPLANT
KIT ABG SYR 3ML LUER SLIP (SYRINGE) IMPLANT
NEEDLE HYPO 25X5/8 SAFETYGLIDE (NEEDLE) IMPLANT
NS IRRIG 1000ML POUR BTL (IV SOLUTION) ×2 IMPLANT
PACK C SECTION WH (CUSTOM PROCEDURE TRAY) ×2 IMPLANT
PAD OB MATERNITY 4.3X12.25 (PERSONAL CARE ITEMS) ×2 IMPLANT
PENCIL SMOKE EVAC W/HOLSTER (ELECTROSURGICAL) ×2 IMPLANT
RTRCTR C-SECT PINK 25CM LRG (MISCELLANEOUS) ×2 IMPLANT
STRIP CLOSURE SKIN 1/2X4 (GAUZE/BANDAGES/DRESSINGS) ×2 IMPLANT
SUT CHROMIC 1 CTX 36 (SUTURE) ×4 IMPLANT
SUT PLAIN 0 NONE (SUTURE) IMPLANT
SUT PLAIN 2 0 (SUTURE) ×1
SUT PLAIN 2 0 XLH (SUTURE) ×2 IMPLANT
SUT PLAIN ABS 2-0 CT1 27XMFL (SUTURE) ×1 IMPLANT
SUT VIC AB 0 CT1 27 (SUTURE) ×2
SUT VIC AB 0 CT1 27XBRD ANBCTR (SUTURE) ×2 IMPLANT
SUT VIC AB 2-0 CT1 27 (SUTURE) ×1
SUT VIC AB 2-0 CT1 TAPERPNT 27 (SUTURE) ×1 IMPLANT
SUT VIC AB 3-0 CT1 27 (SUTURE)
SUT VIC AB 3-0 CT1 TAPERPNT 27 (SUTURE) IMPLANT
SUT VIC AB 4-0 KS 27 (SUTURE) ×2 IMPLANT
TOWEL OR 17X24 6PK STRL BLUE (TOWEL DISPOSABLE) ×2 IMPLANT
TRAY FOLEY BAG SILVER LF 14FR (SET/KITS/TRAYS/PACK) ×2 IMPLANT

## 2016-11-18 NOTE — Progress Notes (Signed)
This was an initial visit with pt and her family who were in good spirits and feeling very hopeful.  She had her mother, her husband as well as his parents in the room with her and she described them all as "prayer warriors."  [name] Lyn Hollingshead has a big sister named [name] who just turned 3 and they were sharing her reactions to being a big sister.  They asked that I keep their baby in prayer.  As I was leaving the nurse practitioner from the NICU came in to talk to them.  I left to give them space to hear her update.  When I later heard that she had told them their baby would be going on the ventilator, the nurse went in to see how she was and offered our services again (I did not want to come in following that news and have the family feel that the chaplain was "sent in" because the news was even more dire than they realized). The family declined speaking at this time.  I did offer support to pt's mother after she stepped out of the room and she was very grateful.    We will continue to check in on family as we are able, but please page as needs arise.  Chaplain Dyanne Carrel, Bcc Pager, [phone] 3:12 PM

## 2016-11-18 NOTE — Anesthesia Procedure Notes (Signed)
Spinal  Patient location during procedure: OR Start time: 11/18/2016 7:25 AM End time: 11/18/2016 7:30 AM Staffing Anesthesiologist: Dandrae Kustra Preanesthetic Checklist Completed: patient identified, site marked, surgical consent, pre-op evaluation, timeout performed, IV checked, risks and benefits discussed and monitors and equipment checked Spinal Block Patient position: sitting Prep: DuraPrep Patient monitoring: heart rate, cardiac monitor, continuous pulse ox and blood pressure Approach: midline Location: L3-4 Injection technique: single-shot Needle Needle type: Sprotte  Needle gauge: 24 G Needle length: 9 cm Assessment Sensory level: T4

## 2016-11-18 NOTE — Progress Notes (Signed)
Patient ID: Felicia Young, female   DOB: April 11, 1990, 26 y.o.   MRN: [id number] Per pt no changes in dictated H&P and brief exam WNL. Ready to proceed.

## 2016-11-18 NOTE — Anesthesia Postprocedure Evaluation (Signed)
Anesthesia Post Note  Patient: Felicia Young  Procedure(s) Performed: Procedure(s) (LRB): REPEAT CESAREAN SECTION (N/A)     Patient location during evaluation: [facility] Anesthesia Type: Spinal Level of consciousness: awake and alert and oriented Pain management: pain level controlled Vital Signs Assessment: post-procedure vital signs reviewed and stable Respiratory status: spontaneous breathing and nonlabored ventilation Cardiovascular status: stable Postop Assessment: no headache, patient able to bend at knees, no backache, no signs of nausea or vomiting, spinal receding and adequate PO intake Anesthetic complications: no    Last Vitals:  Vitals:   11/18/16 1130 11/18/16 1233  BP: (!) 113/54 117/62  Pulse: 76 87  Resp: 15 14  Temp: 36.6 C 36.7 C  SpO2: 100%     Last Pain:  Vitals:   11/18/16 1233  TempSrc: Oral  PainSc:    Pain Goal:                 [employer]

## 2016-11-18 NOTE — Transfer of Care (Signed)
Immediate Anesthesia Transfer of Care Note  Patient: Felicia Young  Procedure(s) Performed: Procedure(s) with comments: REPEAT CESAREAN SECTION (N/A) - MD requests RNFA  Patient Location: PACU  Anesthesia Type:Spinal  Level of Consciousness: awake, alert  and oriented  Airway & Oxygen Therapy: Patient Spontanous Breathing  Post-op Assessment: Report given to RN and Post -op Vital signs reviewed and stable  Post vital signs: Reviewed and stable  Last Vitals:  Vitals:   11/18/16 0550  BP: 135/84  Pulse: (!) 123  Temp: 36.9 C    Last Pain:  Vitals:   11/18/16 0550  TempSrc: Oral  PainSc: 1          Complications: No apparent anesthesia complications

## 2016-11-18 NOTE — Op Note (Signed)
Operative Note    Preoperative Diagnosis Term Pregnancy at 39 1/7 weeks Prior c-section, declines VBAC  Postoperative Diagnosis Same with mild polyhydramnios  Procedure Repeat low transverse c-section with 2 layer closure of uterus  Surgeon Huel Cote, MD  Anesthesia Spinal  Fluids: EBL UOP clear IVF  Findings A viable female infant in the vertex presentation.  Apgars 8,8.  Weight 8#13oz Normal uterus tubes and ovaries.  Baby required some O2 support after delivery and NICU was in attendance.  Was taken to NICU for closer observation   Specimen Placenta to pathology   Procedure Note Patient was taken to the operating room where spinal anesthesia was obtained and found to be adequate by Allis clamp test. She was prepped and draped in the normal sterile fashion in the dorsal supine position with a leftward tilt. An appropriate time out was performed. A Pfannenstiel skin incision was then made through a pre-existing scar with the scalpel and carried through to the underlying layer of fascia by sharp dissection and Bovie cautery. The fascia was nicked in the midline and the incision was extended laterally with Mayo scissors. The inferior aspect of the incision was grasped Coker clamps and dissected off the underlying rectus muscles. In a similar fashion the superior aspect was dissected off the rectus muscles. Rectus muscles were separated in the midline and the peritoneal cavity entered bluntly. The peritoneal incision was then extended both superiorly and inferiorly with careful attention to avoid both bowel and bladder. The Alexis self-retaining wound retractor was then placed within the incision and the lower uterine segment exposed. The bladder flap was developed with Metzenbaum scissors and pushed away from the lower uterine segment. The lower uterine segment was then incised in a transverse fashion and the cavity itself entered bluntly. The incision was  extended bluntly. The infant's head was then lifted and delivered from the incision without difficulty. The remainder of the infant delivered and the nose and mouth bulb suctioned with the cord clamped and cut as well. The infant was handed off to the waiting pediatricians. The placenta was then spontaneously expressed from the uterus and the uterus cleared of all clots and debris with moist lap sponge. The uterine incision was then repaired in 2 layers the first layer was a running locked layer of 1-0 chromic and the second an imbricating layer of the same suture. The tubes and ovaries were inspected and the gutters cleared of all clots and debris. The uterine incision was inspected and found to be hemostatic. All instruments and sponges as well as the Alexis retractor were then removed from the abdomen. The rectus muscles and peritoneum were then reapproximated with a running suture of 2-0 Vicryl. The fascia was then closed with 0 Vicryl in a running fashion. Subcutaneous tissue was reapproximated with 3-0 plain in a running fashion. The skin was closed with a subcuticular stitch of 4-0 Vicryl on a Keith needle and then reinforced with benzoin and Steri-Strips. At the conclusion of the procedure all instruments and sponge counts were correct. Patient was taken to the recovery room in good condition and the baby was taken to NICU for respiratory support given grunting and need for CPAP.

## 2016-11-19 LAB — CBC
HEMATOCRIT: 31.5 % — AB (ref 36.0–46.0)
HEMOGLOBIN: 10.6 g/dL — AB (ref 12.0–15.0)
MCH: 30.5 pg (ref 26.0–34.0)
MCHC: 33.7 g/dL (ref 30.0–36.0)
MCV: 90.8 fL (ref 78.0–100.0)
Platelets: 152 10*3/uL (ref 150–400)
RBC: 3.47 MIL/uL — AB (ref 3.87–5.11)
RDW: 14.5 % (ref 11.5–15.5)
WBC: 9 10*3/uL (ref 4.0–10.5)

## 2016-11-19 MED ORDER — OXYCODONE HCL 5 MG PO TABS
5.0000 mg | ORAL_TABLET | ORAL | Status: DC | PRN
  Administered 2016-11-19 – 2016-11-21 (×3): 5 mg via ORAL
  Filled 2016-11-19 (×4): qty 1

## 2016-11-19 NOTE — Lactation Note (Signed)
This note was copied from a baby's chart. Lactation Consultation Note  Patient Name: Felicia Young [id number] Date: 11/19/2016   RN states mom is not interested in pumping or providing EBM.  Mom told RN that her breast with her first nor this  pregnancy ever changed.  RN reports observing wide spaced, small breast.  RN states mom only wants to formula.  LC did not see pt.     Consult Status  Complete    Lendon Ka 11/19/2016, 3:38 PM

## 2016-11-19 NOTE — Clinical Social Work Maternal (Signed)
CLINICAL SOCIAL WORK MATERNAL/CHILD NOTE  Patient Details  Name: Felicia Young MRN: [id number] Date of Birth: 09/08/1990  Date:  11/19/2016  Clinical Social Worker Initiating Note:   , MSW, LCSW-A   Date/ Time Initiated:  11/19/16/1243              Child's Name:  [name] Alexander Artley   Legal Guardian:  Other (Comment) (Not established by court system; [name] and [name] (Charles Alexander Monie) parent collectively )   Need for Interpreter:  None   Date of Referral:   (No referral; NICU admit)     Reason for Referral:  Other (Comment) (No referral; NICU admit )   Referral Source:  NICU   Address:  [address] [place] [address]  Phone number:  [phone]   Household Members: Self, Minor Children, Spouse   Natural Supports (not living in the home): Extended Family, Friends, Immediate Family   Professional Supports:Therapist   Employment:Unemployed   Type of Work: [name] unemployed; Stay at home mom    Education:  High school graduate   Financial Resources:Medicaid, Private Insurance   Other Resources: Other (Comment) (None reported )   Cultural/Religious Considerations Which May Impact Care: [employer] per face sheet.   Strengths: Ability to meet basic needs , Pediatrician chosen , Compliance with medical plan , Home prepared for child  ([facility]-Dr. Clark )   Risk Factors/Current Problems: Adjustment to Illness , Mental Health Concerns    Cognitive State: Alert , Able to Concentrate , Goal Oriented , Insightful    Mood/Affect: Calm , Comfortable , Interested    CSW Assessment:CSW met with [name] at bedside to complete assessment for new NICU admit. Upon this writers arrival, [name] and [name] were warm and welcoming. This writer explained role and reasoning for visit being due to NICU admission and to offer support services. [name] and [name] were immediately thankful for this writers arrival noting it  was very thoughtful. This writer assessed [name]'s current mental health. [name] notes she is feeling great right now. She further notes baby is doing well and that has helped a lot. This writer noted to [name] that she too went and saw baby today and received report that he was doing better. [name] was thankful. This writer inquired about depression hx and current medication regimen use. [name] notes she has dealt wit depression for some time and is on zoloft and lamictal and feels both are helping a lot. She notes no wants or need to change. CSW supported [name]'s awareness of her own maternal mental health and being proactive with medication compliance. This writer discussed PPD and PMAD with [name]. This writer provided her with new mom checklist for maternal health and encouraged her to complete and check in with herself throughout the post-partum progress. [name] was thankful noting she finds this useful. This writer additionally provided [name] with a list of groups and activites held at the education center. [name] was thankful for all resources provided. This writer assessed for any additional needs at this time. [name] notes she has no additional needs.    CSW Plan/Description: Information/Referral to Community Resources , Patient/Family Education , Psychosocial Support and Ongoing Assessment of Needs    , MSW, LCSW-A Clinical Social Worker  [facility]  Office: [phone]     CLINICAL SOCIAL WORK MATERNAL/CHILD NOTE  Patient Details  Name: Felicia Young MRN: [id number] Date of Birth: Aug 07, 1990  Date:  11/19/2016  Clinical Social Worker Initiating Note:  Ferdinand Lango Pola Furno, MSW, LCSW-A  Date/ Time Initiated:  11/19/16/1243     Child's Name:  [name] Judithann Sauger   Legal Guardian:  Other (Comment) (Not established by court system; [name] and [name] (Charles Judithann Sauger) parent collectively )   Need for Interpreter:  None   Date of Referral:   (No referral; NICU admit)     Reason for Referral:  Other (Comment) (No referral; NICU admit )   Referral Source:  NICU   Address:  [place] [address]  Phone number:  [phone]   Household Members:  Self, Minor Children, Spouse   Natural Supports (not living in the home):  Extended Family, Friends, Immediate Family   Professional Supports: Therapist   Employment: Unemployed   Type of Work: [name] unemployed; Stay at home mom    Education:  High school graduate   Financial Resources:  Medicaid, Private Insurance   Other Resources:  Other (Comment) (None reported )   Cultural/Religious Considerations Which May Impact Care:  [facility] per face sheet.   Strengths:  Ability to meet basic needs , Pediatrician chosen , Compliance with medical plan , Home prepared for child  [facility]-Dr. Carlis Abbott )   Risk Factors/Current Problems:  Adjustment to Illness , Mental Health Concerns    Cognitive State:  Alert , Able to Concentrate , Goal Oriented , Insightful    Mood/Affect:  Calm , Comfortable , Interested    CSW Assessment: CSW met with [name] at bedside to complete assessment for new NICU admit. Upon this writers arrival, [name] and [name] were warm and welcoming. This Probation officer explained role and reasoning for visit being due to NICU admission and to offer support services. [name] and [name] were immediately thankful for this writers arrival noting it was very thoughtful. This  Probation officer assessed [name]'s current mental health. [name] notes she is feeling great right now. She further notes baby is doing well and that has helped a lot. This Probation officer noted to [name] that she too went and saw baby today and received report that he was doing better. [name] was thankful. This Probation officer inquired about depression hx and current medication regimen use. [name] notes she has dealt wit depression for some time and is on zoloft and lamictal and feels both are helping a lot. She notes no wants or need to change. CSW supported [name]'s awareness of her own maternal mental health and being proactive with medication compliance. This Probation officer discussed PPD and PMAD with [name]. This Probation officer provided her with new mom checklist for maternal health and encouraged her to complete and check in with herself throughout the post-partum progress. [name] was thankful noting she finds this useful. This Probation officer additionally provided [name] with a list of groups and activites held at the education center. [name] was thankful for all resources provided. This Probation officer assessed for any additional needs at this time. [name] notes she has no additional needs.    CSW Plan/Description:  Information/Referral to [employer] , Engineer, mining , Psychosocial Support and Ongoing Assessment of Needs   Water quality scientist, MSW, [facility]  Office: [phone]

## 2016-11-19 NOTE — Progress Notes (Signed)
Subjective: Postpartum Day #1: Cesarean Delivery Patient reports incisional pain, tolerating PO and no problems voiding.  Baby improving in NICU, was intubated but extubated now.  Objective: Vital signs in last 24 hours: Temp:  [97.5 F (36.4 C)-98.7 F (37.1 C)] 98.7 F (37.1 C) (08/18 0805) Pulse Rate:  [71-92] 92 (08/18 0805) Resp:  [8-20] 16 (08/18 0805) BP: (107-127)/(54-80) 116/67 (08/18 0805) SpO2:  [98 %-100 %] 99 % (08/18 0805)  Physical Exam:  General: alert Lochia: appropriate Uterine Fundus: firm Incision: dressing C/D/I   Recent Labs  11/17/16 1000 11/19/16 0519  HGB 11.6* 10.6*  HCT 35.1* 31.5*    Assessment/Plan: Status post Cesarean section. Doing well postoperatively.  Continue current care, ambulate.  Felicia Young Felicia Young 11/19/2016, 8:36 AM

## 2016-11-20 ENCOUNTER — Encounter (HOSPITAL_COMMUNITY): Payer: Self-pay | Admitting: *Deleted

## 2016-11-20 NOTE — Progress Notes (Signed)
Patient ID: Felicia Young, female   DOB: [birth year], 26 y.o.   MRN: [id number] POD #2 Doing well, some pain-Oxycodone helped, baby stable in NICU Afeb, VSS Abd- soft, fundus firm, incision intact Continue routine care

## 2016-11-20 NOTE — Plan of Care (Signed)
Problem: Safety: Goal: Ability to remain free from injury will improve Outcome: Progressing Pt risk factors for falls had decreased, pt ambulating in the halls with no dizziness, and a steady gate.   Problem: Activity: Goal: Risk for activity intolerance will decrease Outcome: Adequate for Discharge Pt alternating activity and rest periods, visiting NICU more today than yesterday   Problem: Fluid Volume: Goal: Ability to maintain a balanced intake and output will improve Outcome: Completed/Met Date Met: 11/20/16 Has emptied 2 pitchers of water and multiple other oral fluids this shift, eating 80/90% of meals and outside food.   Problem: Bowel/Gastric: Goal: Will not experience complications related to bowel motility Outcome: Progressing Pt passing gas   Problem: Coping: Goal: Ability to cope will improve Outcome: Progressing Pt using laughter and distraction to keep spirits up, using support systems appropriately.   Problem: Life Cycle: Goal: Risk for postpartum hemorrhage will decrease Outcome: Progressing Lochia scant, no clotts   Problem: Nutritional: Goal: Mothers verbalization of comfort with breastfeeding process will improve Outcome: Progressing Pt decided to express breast milk during her stay in order to provide, benefits to her infant. Pt has expressed several large drops of colostrum with each pumping.

## 2016-11-21 MED ORDER — OXYCODONE-ACETAMINOPHEN 5-325 MG PO TABS: 1.0000 | ORAL_TABLET | ORAL | 0 refills | Status: DC | PRN

## 2016-11-21 MED ORDER — IBUPROFEN 600 MG PO TABS: 600.0000 mg | ORAL_TABLET | Freq: Four times a day (QID) | ORAL | 1 refills | Status: DC | PRN

## 2016-11-21 NOTE — Progress Notes (Signed)
Patient ID: Felicia Young, female   DOB: 24-Nov-1990, 26 y.o.   MRN: [id number] Pt doing well. Back from seeign baby in NICU - back on O2 but IV out. Pt teary as expected. No fever, chills, CP or SOB. Tol PO and voiding well. Ambulating with no issues.  VSS ABD- dressing c/d/i EXT - no homans  A/P: POD#3 s/p repeat C/S - stable         Reviewed discharge instructions         Will check with case mgmt to see if pt ok to stay extra night

## 2016-11-21 NOTE — Discharge Instructions (Signed)
Nothing in vagina for 6 weeks.  No sex, tampons, and douching.  Other instructions as in [facility] Discharge Booklet. °

## 2016-11-21 NOTE — Anesthesia Preprocedure Evaluation (Signed)
Anesthesia Evaluation  Patient identified by MRN, date of birth, ID band Patient awake    Reviewed: Allergy & Precautions, H&P , NPO status , Patient's Chart, lab work & pertinent test results, reviewed documented beta blocker date and time   Airway Mallampati: II  TM Distance: >3 FB Neck ROM: full    Dental no notable dental hx.    Pulmonary neg pulmonary ROS,    Pulmonary exam normal breath sounds clear to auscultation       Cardiovascular negative cardio ROS Normal cardiovascular exam Rhythm:regular Rate:Normal     Neuro/Psych negative neurological ROS  negative psych ROS   GI/Hepatic negative GI ROS, Neg liver ROS,   Endo/Other  negative endocrine ROS  Renal/GU negative Renal ROS  negative genitourinary   Musculoskeletal   Abdominal   Peds  Hematology negative hematology ROS (+)   Anesthesia Other Findings   Reproductive/Obstetrics (+) Pregnancy                             Anesthesia Physical Anesthesia Plan  ASA: II  Anesthesia Plan: Spinal   Post-op Pain Management:    Induction:   PONV Risk Score and Plan:   Airway Management Planned:   Additional Equipment:   Intra-op Plan:   Post-operative Plan:   Informed Consent: I have reviewed the patients History and Physical, chart, labs and discussed the procedure including the risks, benefits and alternatives for the proposed anesthesia with the patient or authorized representative who has indicated his/her understanding and acceptance.     Plan Discussed with:   Anesthesia Plan Comments:         Anesthesia Quick Evaluation  

## 2016-11-21 NOTE — Discharge Summary (Addendum)
OB Discharge Summary     Patient Name: Felicia Young DOB: Jan 02, 1991 MRN: [id number]  Date of admission: 11/18/2016 Delivering MD: Huel Cote   Date of discharge: 11/21/2016  Admitting diagnosis: repeat c-section Intrauterine pregnancy: [redacted]w[redacted]d     Secondary diagnosis:  Active Problems:   S/P repeat low transverse C-section  Additional problems: none     Discharge diagnosis: Term Pregnancy Delivered                                                                                                Post partum procedures:none  Augmentation: n/a  Complications: None  Hospital course:  Sceduled C/S   26 y.o. yo G2P2001 at [redacted]w[redacted]d was admitted to the hospital 11/18/2016 for scheduled cesarean section with the following indication:Elective Repeat.  Membrane Rupture Time/Date: 7:49 AM ,11/18/2016   Patient delivered a Viable infant.11/18/2016  Details of operation can be found in separate operative note.  Pateint had an uncomplicated postpartum course.  She is ambulating, tolerating a regular diet, passing flatus, and urinating well. Patient is discharged home in stable condition on  11/21/16         Physical exam  Vitals:   11/20/16 2032 11/20/16 2225 11/21/16 0556 11/21/16 0754  BP: 130/81   123/73  Pulse: 85   89  Resp: 17 18 18 14   Temp: 98.2 F (36.8 C)   99.2 F (37.3 C)  TempSrc:    Oral  SpO2: 99%   99%  Weight:      Height:       General: alert, cooperative and no distress Lochia: appropriate Uterine Fundus: firm Incision: Dressing is clean, dry, and intact DVT Evaluation: No evidence of DVT seen on physical exam. Labs: Lab Results  Component Value Date   WBC 9.0 11/19/2016   HGB 10.6 (L) 11/19/2016   HCT 31.5 (L) 11/19/2016   MCV 90.8 11/19/2016   PLT 152 11/19/2016   CMP Latest Ref Rng & Units 01/21/2015  Glucose 70 - 99 mg/dL 82  BUN 6 - 23 mg/dL 13  Creatinine 4.09 - 8.11 mg/dL 9.14  Sodium 782 - 956 mEq/L 138  Potassium 3.5 - 5.1 mEq/L 4.4   Chloride 96 - 112 mEq/L 105  CO2 19 - 32 mEq/L 25  Calcium 8.4 - 10.5 mg/dL 9.6  Total Protein 6.0 - 8.3 g/dL 7.6  Total Bilirubin 0.2 - 1.2 mg/dL 0.4  Alkaline Phos 39 - 117 U/L 42  AST 0 - 37 U/L 12  ALT 0 - 35 U/L 15    Discharge instruction: per After Visit Summary and "Baby and Me Booklet".  After visit meds:  Allergies as of 11/21/2016   No Known Allergies     Medication List    TAKE these medications   albuterol 108 (90 Base) MCG/ACT inhaler Commonly known as:  PROVENTIL HFA;VENTOLIN HFA Inhale 2 puffs into the lungs every 6 (six) hours as needed for wheezing.   Diclofenac Sodium 2 % Soln Commonly known as:  PENNSAID Place 1 application onto the skin 2 (two) times daily as needed.   ibuprofen 600 MG  tablet Commonly known as:  ADVIL,MOTRIN Take 1 tablet (600 mg total) by mouth every 6 (six) hours as needed.   Ibuprofen-Famotidine 800-26.6 MG Tabs Take 1 tablet by mouth 3 (three) times daily as needed.   IRON PO Take 1 tablet by mouth every other day.   lamoTRIgine 100 MG tablet Commonly known as:  LAMICTAL Take 100 mg by mouth every evening.   oxyCODONE-acetaminophen 5-325 MG tablet Commonly known as:  PERCOCET Take 1 tablet by mouth every 4 (four) hours as needed for severe pain.   PRENATAL GUMMIES/DHA & FA PO Take 2 each by mouth every evening.   sertraline 100 MG tablet Commonly known as:  ZOLOFT Take 100 mg by mouth every evening.       Diet: routine diet  Activity: Advance as tolerated. Pelvic rest for 6 weeks.   Outpatient follow up:2 weeks Follow up Appt:No future appointments. Follow up Visit:No Follow-up on file.  Postpartum contraception: Not Discussed  Newborn Data: Live born female  Birth Weight: 8 lb 13.1 oz (4000 g) APGAR: 8, 8  Baby Feeding: Breast Disposition:NICU   Discharge held per maternal request 8/20, discharge to home 11/22/16.  routine care.  D/c with motrin and percocet.  Dr Ellyn Hack  11/21/2016 Cathrine Muster,  DO

## 2016-11-22 NOTE — Progress Notes (Signed)
Subjective: Postpartum Day 4: Cesarean Delivery Patient reports incisional pain and tolerating PO.  Routine care  Objective: Vital signs in last 24 hours: Temp:  [98.2 F (36.8 C)-98.4 F (36.9 C)] 98.2 F (36.8 C) (08/21 0532) Pulse Rate:  [75-81] 75 (08/21 0532) Resp:  [16-18] 18 (08/21 0532) BP: (118-129)/(65-78) 118/65 (08/21 0532) SpO2:  [99 %] 99 % (08/21 0532)  Physical Exam:  General: alert and no distress Lochia: appropriate Uterine Fundus: firm Incision: healing well DVT Evaluation: No evidence of DVT seen on physical exam.  No results for input(s): HGB, HCT in the last 72 hours.  Assessment/Plan: Status post Cesarean section. Doing well postoperatively.  Discharge home with standard precautions and return to clinic in 2 weeks.  Will d/c to home D/c with motrin, percocet and PNV.    Baby doing well - still on O2 ([place] 1L), no abx  Felicia Young 11/22/2016, 9:57 AM

## 2017-04-18 DIAGNOSIS — F319 Bipolar disorder, unspecified: Secondary | ICD-10-CM | POA: Diagnosis not present

## 2017-05-25 DIAGNOSIS — F319 Bipolar disorder, unspecified: Secondary | ICD-10-CM | POA: Diagnosis not present

## 2017-07-31 IMAGING — US US MFM OB FOLLOW-UP
1 series · 14 of 28 positions shown · non-contrast
Comparison: none

[Series 1: us mfm ob follow-up · 53 acquisitions, 14 frames shown]
[im 2/53]
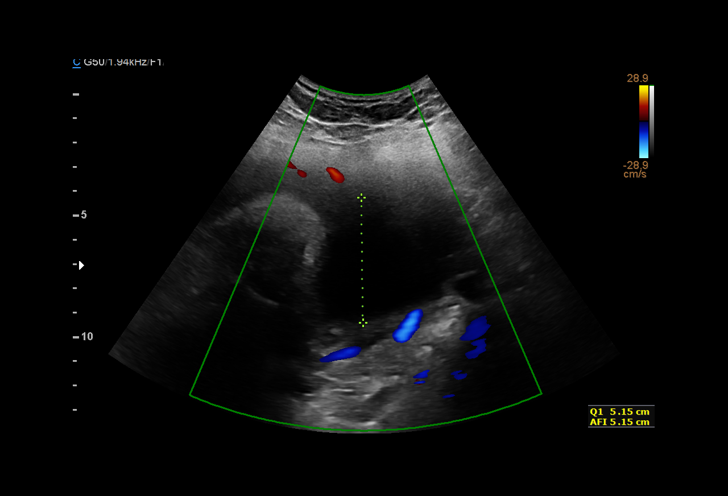
[im 6/53]
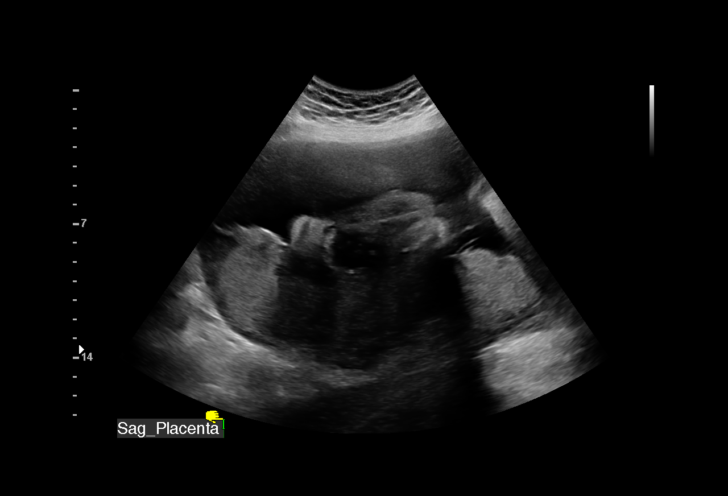
[im 10/53]
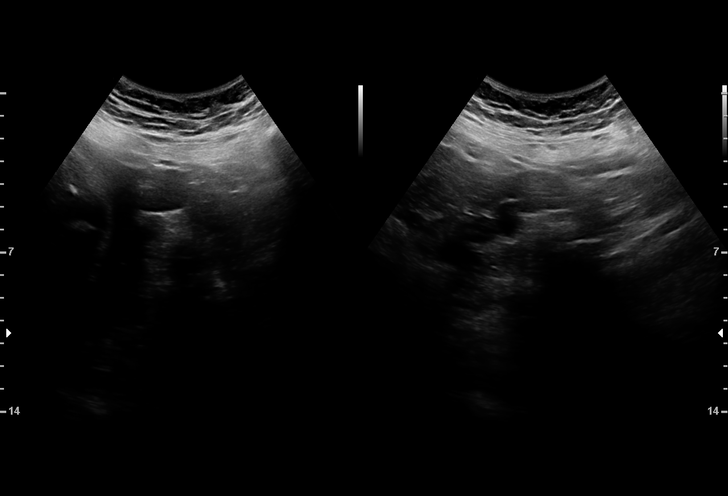
[im 14/53]
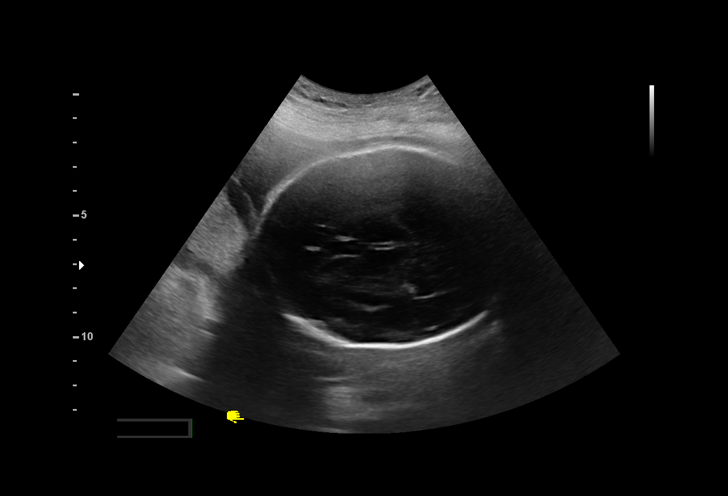
[im 18/53]
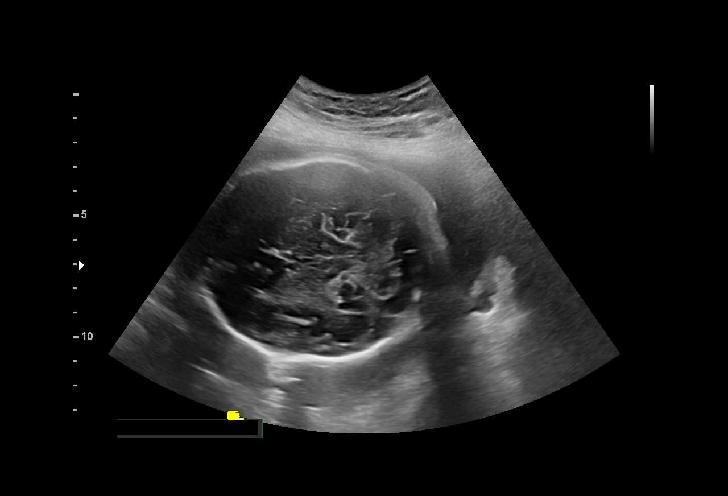
[im 22/53]
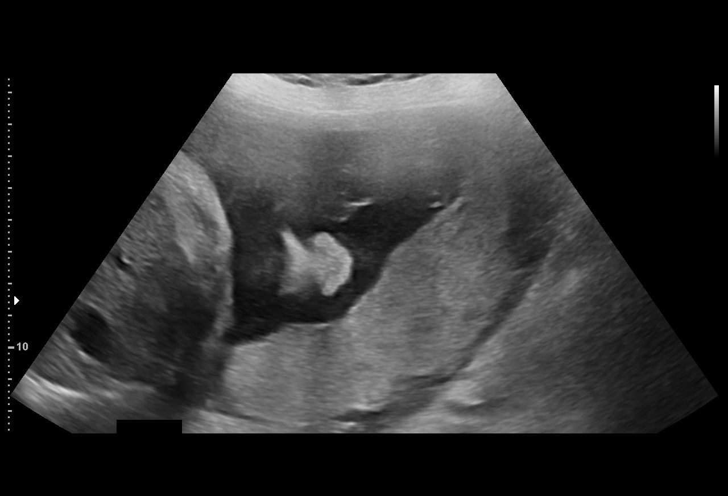
[im 26/53]
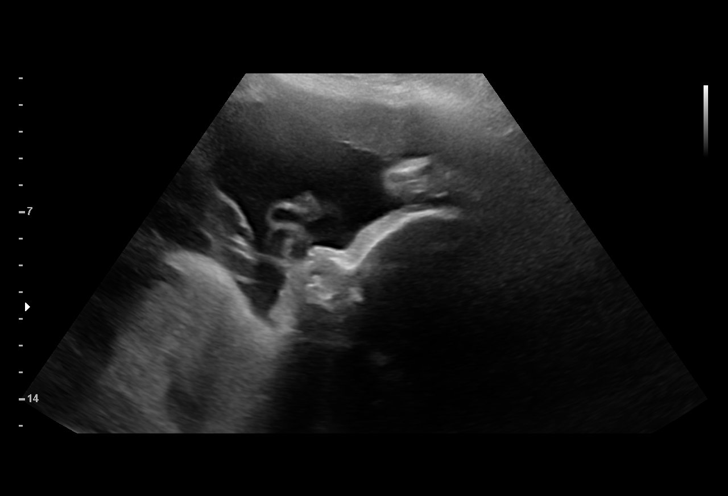
[im 29/53]
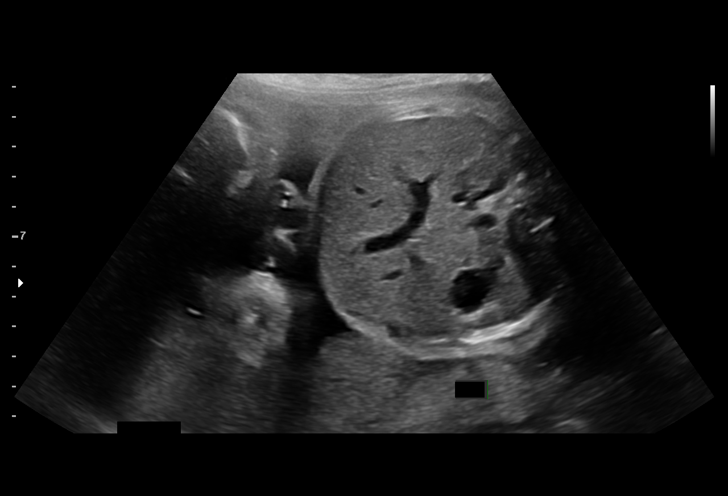
[im 33/53]
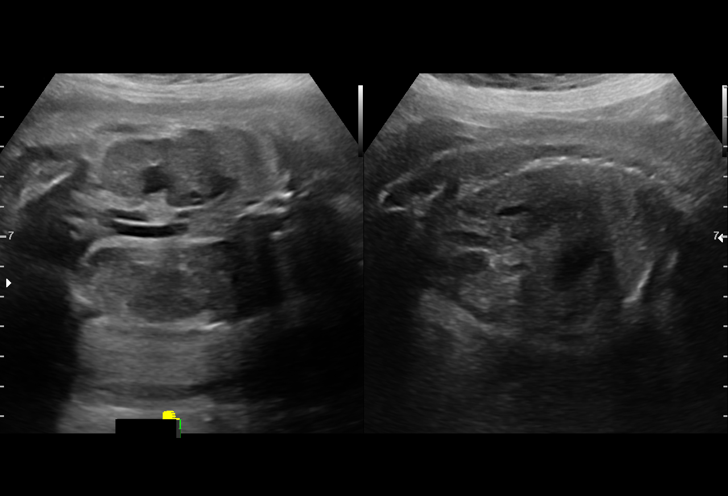
[im 37/53]
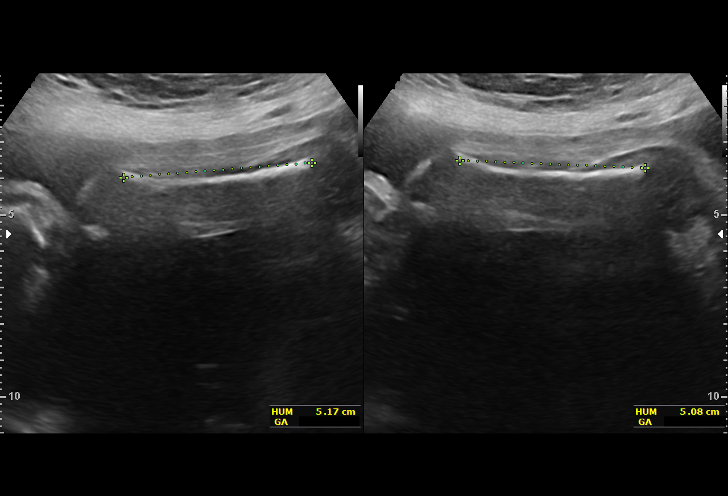
[im 41/53]
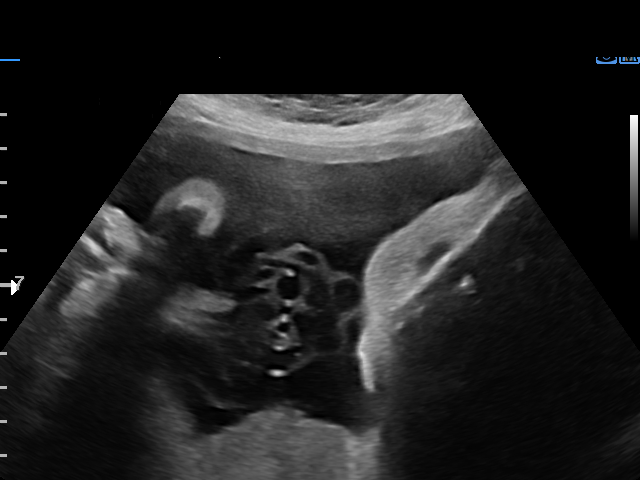
[im 45/53]
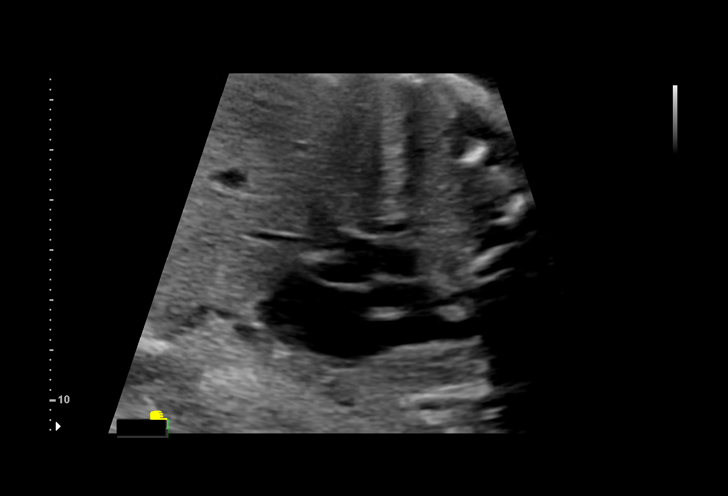
[im 49/53]
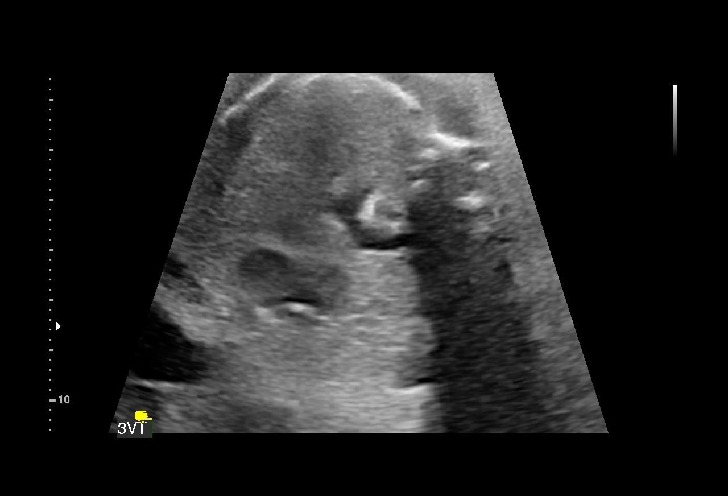
[im 53/53]
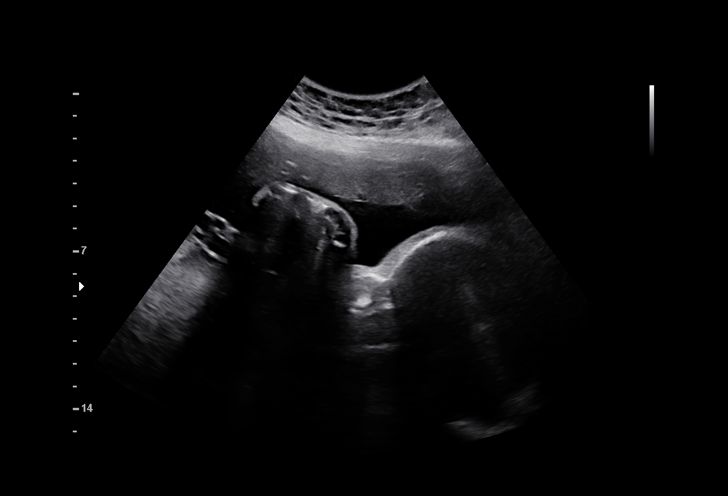

[14 of 28 positions shown; findings below may reference images not displayed]

[HOSPITAL],
Inc[facility]

1  BOZENA ANNA HYSENI              [id number]      [id number]     [id number]
Indications

30 weeks gestation of pregnancy
Previous cesarean delivery, antepartum
Obesity complicating pregnancy, third
trimester
Evaluate anatomy not seen on prior
sonogram
OB History

Gravidity:    2         Term:   1        Prem:   0        SAB:   0
TOP:          0       Ectopic:  0        Living: 1
Fetal Evaluation

Num Of Fetuses:     1
Fetal Heart         133
Rate(bpm):
Cardiac Activity:   Observed
Presentation:       Cephalic
Placenta:           Posterior, above cervical os
P. Cord Insertion:  Previously Visualized

Amniotic Fluid
AFI FV:      Subjectively within normal limits

AFI Sum(cm)     %Tile       Largest Pocket(cm)
18.93           72

RUQ(cm)       RLQ(cm)       LUQ(cm)        LLQ(cm)
5.15
Biometry

BPD:      82.4  mm     G. Age:  33w 1d         95  %    CI:        73.74   %   70 - 86
FL/HC:      18.3   %   19.3 -
HC:      304.8  mm     G. Age:  33w 6d         92  %    HC/AC:      1.05       0.96 -
AC:      289.8  mm     G. Age:  33w 0d         95  %    FL/BPD:     67.8   %   71 - 87
FL:       55.9  mm     G. Age:  29w 3d          9  %    FL/AC:      19.3   %   20 - 24
HUM:      51.3  mm     G. Age:  30w 0d         37  %

Est. FW:    0711  gm      4 lb 3 oz     77  %
Gestational Age

LMP:           30w 5d       Date:   02/18/16                 EDD:   11/24/16
U/S Today:     32w 3d                                        EDD:   11/12/16
Best:          30w 5d    Det. By:   LMP  (02/18/16)          EDD:   11/24/16
Anatomy

Cranium:               Appears normal         Aortic Arch:            Previously seen
Cavum:                 Appears normal         Ductal Arch:            Previously seen
Ventricles:            Appears normal         Diaphragm:              Appears normal
Choroid Plexus:        Previously seen        Stomach:                Appears normal, left
sided
Cerebellum:            Appears normal         Abdomen:                Appears normal
Posterior Fossa:       Appears normal         Abdominal Wall:         Appears nml (cord
insert, abd wall)
Nuchal Fold:           Not applicable (>20    Cord Vessels:           Appears normal (3
wks GA)                                        vessel cord)
Face:                  Profile nl; orbits     Kidneys:                Appear normal
previously visualized
Lips:                  Appears normal         Bladder:                Appears normal
Thoracic:              Appears normal         Spine:                  Previously seen
Heart:                 Appears normal         Upper Extremities:      Previously seen
(4CH, axis, and
situs)
RVOT:                  Previously seen        Lower Extremities:      Previously seen
LVOT:                  Appears normal

Other:  Fetus appears to be a male. Heels previously visualized. Nasal bone
visualized.
Cervix Uterus Adnexa

Cervix
Not visualized (advanced GA >95wks)

Uterus
No abnormality visualized.

Left Ovary
Within normal limits.

Right Ovary
Not visualized.

Cul De Sac:   No free fluid seen.

Adnexa:       No abnormality visualized.
Impression

Singleton intrauterine pregnancy at 30+5 weeks, here to
complete anatomic survey
Review of the anatomy shows no sonographic markers for
aneuploidy or structural anomalies
All relevant fetal anatomy has been visualized
Amniotic fluid volume is normal
Estimated fetal weight is 1890g which is growth in the 77th
percentile
Recommendations

Follow-up ultrasounds as clinically indicate

## 2017-08-17 DIAGNOSIS — F319 Bipolar disorder, unspecified: Secondary | ICD-10-CM | POA: Diagnosis not present

## 2017-09-15 DIAGNOSIS — R0981 Nasal congestion: Secondary | ICD-10-CM | POA: Diagnosis not present

## 2017-09-15 DIAGNOSIS — R05 Cough: Secondary | ICD-10-CM | POA: Diagnosis not present

## 2017-09-15 DIAGNOSIS — R070 Pain in throat: Secondary | ICD-10-CM | POA: Diagnosis not present

## 2017-09-15 DIAGNOSIS — Z6829 Body mass index (BMI) 29.0-29.9, adult: Secondary | ICD-10-CM | POA: Diagnosis not present

## 2017-09-20 DIAGNOSIS — H1089 Other conjunctivitis: Secondary | ICD-10-CM | POA: Diagnosis not present

## 2017-09-20 DIAGNOSIS — J3089 Other allergic rhinitis: Secondary | ICD-10-CM | POA: Diagnosis not present

## 2017-09-20 DIAGNOSIS — J0100 Acute maxillary sinusitis, unspecified: Secondary | ICD-10-CM | POA: Diagnosis not present

## 2017-09-20 DIAGNOSIS — R05 Cough: Secondary | ICD-10-CM | POA: Diagnosis not present

## 2018-02-01 ENCOUNTER — Ambulatory Visit: Payer: Self-pay | Admitting: Psychiatry

## 2018-02-15 ENCOUNTER — Encounter: Payer: Self-pay | Admitting: Psychiatry

## 2018-02-15 ENCOUNTER — Ambulatory Visit (INDEPENDENT_AMBULATORY_CARE_PROVIDER_SITE_OTHER): Payer: BLUE CROSS/BLUE SHIELD | Admitting: Psychiatry

## 2018-02-15 DIAGNOSIS — F3181 Bipolar II disorder: Secondary | ICD-10-CM

## 2018-02-15 DIAGNOSIS — F411 Generalized anxiety disorder: Secondary | ICD-10-CM | POA: Diagnosis not present

## 2018-02-15 NOTE — Progress Notes (Signed)
Felicia Young [id number] February 11, 1991 27 y.o.  Subjective:   [name] is a 27 y.o. (DOB 1990/10/06) female.  Chief Complaint:  Chief Complaint  Patient presents with  . Follow-up    bipolar 2 and anxiety    HPI Felicia Young presents to the office today for follow-up of Bipolar 2, and GAD with obsessional elements.   Doing well.  Kids are keeping her busy.  [name] still not walking.  Felicia Young evaluated by audiologist and sensitive to sounds. She is 27 yo and has hyperacusis.  Can  Be a lot to handle.  Patient reports stable mood and denies depressed or irritable moods.  Patient denies any recent difficulty with anxiety.  Patient denies difficulty with sleep initiation or maintenance. Denies appetite disturbance.  Patient reports that energy and motivation have been good.  Patient denies any difficulty with concentration.  Patient denies any suicidal ideation.  Increase the lamotrigine and sertraline to 150mg  each in January.  She's pleased.  No mood swings.  Review of Systems:  Review of Systems  Neurological: Negative for tremors and weakness.  Psychiatric/Behavioral: Negative for agitation, behavioral problems, confusion, decreased concentration, dysphoric mood, hallucinations, self-injury, sleep disturbance and suicidal ideas. The patient is not nervous/anxious and is not hyperactive.     Medications: I have reviewed the patient's current medications.  Current Outpatient Medications  Medication Sig Dispense Refill  . albuterol (PROVENTIL HFA;VENTOLIN HFA) 108 (90 BASE) MCG/ACT inhaler Inhale 2 puffs into the lungs every 6 (six) hours as needed for wheezing. 1 Inhaler 0  . lamoTRIgine (LAMICTAL) 150 MG tablet Take 100 mg by mouth daily.     . sertraline (ZOLOFT) 100 MG tablet Take 100 mg by mouth daily. Take 1.5 tabs    . Diclofenac Sodium (PENNSAID) 2 % SOLN Place 1 application onto the skin 2 (two) times daily as needed. 112 g 1  . ibuprofen (ADVIL,MOTRIN)  600 MG tablet Take 1 tablet (600 mg total) by mouth every 6 (six) hours as needed. 40 tablet 1  . Ibuprofen-Famotidine 800-26.6 MG TABS Take 1 tablet by mouth 3 (three) times daily as needed. (Patient not taking: Reported on 11/15/2016) 90 tablet 1  . IRON PO Take 1 tablet by mouth every other day.    . oxyCODONE-acetaminophen (PERCOCET) 5-325 MG tablet Take 1 tablet by mouth every 4 (four) hours as needed for severe pain. (Patient not taking: Reported on 02/15/2018) 30 tablet 0  . Prenatal MV-Min-FA-Omega-3 (PRENATAL GUMMIES/DHA & FA PO) Take 2 each by mouth every evening.     No current facility-administered medications for this visit.     Medication Side Effects: None  Allergies: No Known Allergies  Past Medical History:  Diagnosis Date  . ALLERGIC RHINITIS    seasonal  . Depression   . Dyslipidemia 01/11/2013   CPX 01/2013 dx - Recommend diet/aerobic exercise changes with weight reduction  . Exercise-induced asthma   . Generalized anxiety disorder   . Migraine   . PCOS (polycystic ovarian syndrome)     Family History  Problem Relation Age of Onset  . Arthritis Mother   . Arthritis Father   . Alcohol abuse Maternal Grandmother   . Hyperlipidemia Maternal Grandmother   . Stroke Maternal Grandmother   . Hyperlipidemia Maternal Grandfather   . Hyperlipidemia Paternal Grandmother   . Hyperlipidemia Paternal Grandfather   . Hypertension Paternal Grandfather    Mother also has bipolar disorder type II plus anxiety Social History   Socioeconomic  History  . Marital status: Married    Spouse name: Not on file  . Number of children: Not on file  . Years of education: Not on file  . Highest education level: Not on file  Occupational History  . Not on file  Social Needs  . Financial resource strain: Not on file  . Food insecurity:    Worry: Not on file    Inability: Not on file  . Transportation needs:    Medical: Not on file    Non-medical: Not on file  Tobacco Use  .  Smoking status: Never Smoker  . Smokeless tobacco: Never Used  Substance and Sexual Activity  . Alcohol use: No  . Drug use: No  . Sexual activity: Not on file  Lifestyle  . Physical activity:    Days per week: Not on file    Minutes per session: Not on file  . Stress: Not on file  Relationships  . Social connections:    Talks on phone: Not on file    Gets together: Not on file    Attends religious service: Not on file    Active member of club or organization: Not on file    Attends meetings of clubs or organizations: Not on file    Relationship status: Not on file  . Intimate partner violence:    Fear of current or ex partner: Not on file    Emotionally abused: Not on file    Physically abused: Not on file    Forced sexual activity: Not on file  Other Topics Concern  . Not on file  Social History Narrative  . Not on file    Past Medical History, Surgical history, Social history, and Family history were reviewed and updated as appropriate.   Please see review of systems for further details on the patient's review from today.   Objective:   Physical Exam:  There were no vitals taken for this visit.  Physical Exam  Constitutional: She is oriented to person, place, and time. She appears well-developed. No distress.  Musculoskeletal: She exhibits no deformity.  Neurological: She is alert and oriented to person, place, and time. She displays no tremor. Coordination and gait normal.  Psychiatric: She has a normal mood and affect. Her speech is normal and behavior is normal. Judgment and thought content normal. Her mood appears not anxious. Her affect is not angry, not blunt, not labile and not inappropriate. Cognition and memory are normal. She does not exhibit a depressed mood. She expresses no homicidal and no suicidal ideation. She expresses no suicidal plans and no homicidal plans.  Insight intact. No auditory or visual hallucinations. No delusions.     Lab Review:      Component Value Date/Time   NA 138 01/21/2015 1051   K 4.4 01/21/2015 1051   CL 105 01/21/2015 1051   CO2 25 01/21/2015 1051   GLUCOSE 82 01/21/2015 1051   BUN 13 01/21/2015 1051   CREATININE 0.64 01/21/2015 1051   CALCIUM 9.6 01/21/2015 1051   PROT 7.6 01/21/2015 1051   ALBUMIN 4.1 01/21/2015 1051   AST 12 01/21/2015 1051   ALT 15 01/21/2015 1051   ALKPHOS 42 01/21/2015 1051   BILITOT 0.4 01/21/2015 1051   GFRNONAA >90 10/22/2013 2000   GFRAA >90 10/22/2013 2000       Component Value Date/Time   WBC 9.0 11/19/2016 0519   RBC 3.47 (L) 11/19/2016 0519   HGB 10.6 (L) 11/19/2016 0519   HCT  31.5 (L) 11/19/2016 0519   PLT 152 11/19/2016 0519   MCV 90.8 11/19/2016 0519   MCH 30.5 11/19/2016 0519   MCHC 33.7 11/19/2016 0519   RDW 14.5 11/19/2016 0519   LYMPHSABS 2.5 01/21/2015 1051   MONOABS 0.7 01/21/2015 1051   EOSABS 0.1 01/21/2015 1051   BASOSABS 0.0 01/21/2015 1051    No results found for: POCLITH, LITHIUM   No results found for: PHENYTOIN, PHENOBARB, VALPROATE, CBMZ   .res Assessment: Plan:    Bipolar II disorder (HCC)  Generalized anxiety disorder   Greater than 50% of face to face time with patient was spent on counseling and coordination of care. We discussed symptoms are well controlled at this time.  Tolerating meds well.  Been relatively stable. No indication to change. No mood swings with the hgher dosage of Zoloft. Disc the risk.  Benefit from the increase.   Counseled patient regarding potential benefits, risks, and side effects of Lamictal to include potential risk of Stevens-Johnson syndrome. Advised patient to stop taking Lamictal and contact office immediately if rash develops and to seek urgent medical attention if rash is severe and/or spreading quickly.   Supportive therapy re: dealing with D's emotional problems.    This appointment was 15 minutes  FU 6 mos.  Meredith Staggers, MD, DFAPA   Please see After Visit Summary for patient specific  instructions.  No future appointments.  No orders of the defined types were placed in this encounter.     -------------------------------

## 2018-03-07 DIAGNOSIS — Z1151 Encounter for screening for human papillomavirus (HPV): Secondary | ICD-10-CM | POA: Diagnosis not present

## 2018-03-07 DIAGNOSIS — Z130 Encounter for screening for diseases of the blood and blood-forming organs and certain disorders involving the immune mechanism: Secondary | ICD-10-CM | POA: Diagnosis not present

## 2018-03-07 DIAGNOSIS — Z3041 Encounter for surveillance of contraceptive pills: Secondary | ICD-10-CM | POA: Diagnosis not present

## 2018-03-07 DIAGNOSIS — Z124 Encounter for screening for malignant neoplasm of cervix: Secondary | ICD-10-CM | POA: Diagnosis not present

## 2018-03-07 DIAGNOSIS — Z6830 Body mass index (BMI) 30.0-30.9, adult: Secondary | ICD-10-CM | POA: Diagnosis not present

## 2018-03-07 DIAGNOSIS — Z01419 Encounter for gynecological examination (general) (routine) without abnormal findings: Secondary | ICD-10-CM | POA: Diagnosis not present

## 2018-03-09 DIAGNOSIS — E7849 Other hyperlipidemia: Secondary | ICD-10-CM | POA: Diagnosis not present

## 2018-03-09 DIAGNOSIS — Z0000 Encounter for general adult medical examination without abnormal findings: Secondary | ICD-10-CM | POA: Diagnosis not present

## 2018-03-09 DIAGNOSIS — E559 Vitamin D deficiency, unspecified: Secondary | ICD-10-CM | POA: Diagnosis not present

## 2018-03-16 DIAGNOSIS — Z1389 Encounter for screening for other disorder: Secondary | ICD-10-CM | POA: Diagnosis not present

## 2018-03-16 DIAGNOSIS — Z23 Encounter for immunization: Secondary | ICD-10-CM | POA: Diagnosis not present

## 2018-03-16 DIAGNOSIS — E663 Overweight: Secondary | ICD-10-CM | POA: Diagnosis not present

## 2018-03-16 DIAGNOSIS — E282 Polycystic ovarian syndrome: Secondary | ICD-10-CM | POA: Diagnosis not present

## 2018-03-16 DIAGNOSIS — E7849 Other hyperlipidemia: Secondary | ICD-10-CM | POA: Diagnosis not present

## 2018-03-16 DIAGNOSIS — Z0000 Encounter for general adult medical examination without abnormal findings: Secondary | ICD-10-CM | POA: Diagnosis not present

## 2018-03-16 DIAGNOSIS — E559 Vitamin D deficiency, unspecified: Secondary | ICD-10-CM | POA: Diagnosis not present

## 2018-05-07 ENCOUNTER — Other Ambulatory Visit: Payer: Self-pay | Admitting: Psychiatry

## 2018-05-09 DIAGNOSIS — R3121 Asymptomatic microscopic hematuria: Secondary | ICD-10-CM | POA: Diagnosis not present

## 2018-08-16 ENCOUNTER — Encounter: Payer: Self-pay | Admitting: Psychiatry

## 2018-08-16 ENCOUNTER — Other Ambulatory Visit: Payer: Self-pay

## 2018-08-16 ENCOUNTER — Ambulatory Visit (INDEPENDENT_AMBULATORY_CARE_PROVIDER_SITE_OTHER): Payer: BLUE CROSS/BLUE SHIELD | Admitting: Psychiatry

## 2018-08-16 DIAGNOSIS — F3181 Bipolar II disorder: Secondary | ICD-10-CM | POA: Diagnosis not present

## 2018-08-16 DIAGNOSIS — F411 Generalized anxiety disorder: Secondary | ICD-10-CM | POA: Diagnosis not present

## 2018-08-16 NOTE — Progress Notes (Addendum)
Felicia Young [id number] May 01, 1990 28 y.o.   Virtual Visit via Telephone Note  I connected with pt by telephone and verified that I am speaking with the correct person using two identifiers.   I discussed the limitations, risks, security and privacy concerns of performing an evaluation and management service by telephone and the availability of in person appointments. I also discussed with the patient that there may be a patient responsible charge related to this service. The patient expressed understanding and agreed to proceed.  I discussed the assessment and treatment plan with the patient. The patient was provided an opportunity to ask questions and all were answered. The patient agreed with the plan and demonstrated an understanding of the instructions.   The patient was advised to call back or seek an in-person evaluation if the symptoms worsen or if the condition fails to improve as anticipated.  I provided 15 minutes of non-face-to-face time during this encounter. The call started at 950 and ended at 79. The patient was located at home and the provider was located office.   Subjective:   Patient ID:  SYESHA THAW is a 28 y.o. (DOB 1990/08/15) female.  Chief Complaint:  Chief Complaint  Patient presents with  . Follow-up    Medication Management  . Anxiety    Medication Management    Anxiety  Patient reports no confusion, decreased concentration, nervous/anxious behavior or suicidal ideas.     [name] presents to the office today for follow-up of Bipolar 2, and GAD with obsessional elements.    Last seen February 15, 2018. No meds were changed.  Doing well with Covid overall but stir crazy at times.  Especially with 2 small kids.  Had struggled with awakening and switched Lamotrigine to morning thinking it might be cause of insomnia and sleep is a lot better.  Switched almost a week a go.  Less afternoon slump.  Better energy and motivation. Sleep 8  hours.  Patient reports stable mood without mood swings and denies depressed or irritable moods.  Patient denies any recent difficulty with anxiety.  Patient denies difficulty with sleep initiation or maintenance. Denies appetite disturbance.  Patient reports that energy and motivation have been good.  Patient denies any difficulty with concentration.  Patient denies any suicidal ideation.  Doing well.  Kids are keeping her busy.  [name] walking now.  Worries about him bc difficulty birth.  No flashbacks anymore. No NM usually.Maryann Alar evaluated by audiologist and sensitive to sounds. She is 28 yo and has hyperacusis.  Can  Be a lot to handle.  Increase the lamotrigine and sertraline to  each in January 2019.  She's pleased.  No mood swings.  [name] was born via emergency C-section November 18, 2017 and swallowed fluid resulting in hypoxia and a NICU stay of 5 days.  Patient had some PTSD symptoms thereafter but those have largely resolved.  Past Psychiatric Medication Trials:  Lexapro, Depakote, Lamotrigine, Zoloft                                                                    Review of Systems:  Review of Systems  Neurological: Negative for tremors and weakness.  Psychiatric/Behavioral: Negative for agitation, behavioral problems, confusion, decreased  concentration, dysphoric mood, hallucinations, self-injury, sleep disturbance and suicidal ideas. The patient is not nervous/anxious and is not hyperactive.     Medications: I have reviewed the patient's current medications.  Current Outpatient Medications  Medication Sig Dispense Refill  . albuterol (PROVENTIL HFA;VENTOLIN HFA) 108 (90 BASE) MCG/ACT inhaler Inhale 2 puffs into the lungs every 6 (six) hours as needed for wheezing. 1 Inhaler 0  . lamoTRIgine (LAMICTAL) 150 MG tablet TAKE 1 TABLET BY MOUTH DAILY 90 tablet 1  . Melatonin 5 MG CHEW Chew by mouth.    Marland Kitchen. PREVIFEM 0.25-35 MG-MCG tablet     . sertraline (ZOLOFT) 100 MG  tablet TAKE 1 AND 1/2 TABLETS BY MOUTH DAILY 135 tablet 1   No current facility-administered medications for this visit.     Medication Side Effects: None  Allergies: No Known Allergies  Past Medical History:  Diagnosis Date  . ALLERGIC RHINITIS    seasonal  . Depression   . Dyslipidemia 01/11/2013   CPX 01/2013 dx - Recommend diet/aerobic exercise changes with weight reduction  . Exercise-induced asthma   . Generalized anxiety disorder   . Migraine   . PCOS (polycystic ovarian syndrome)     Family History  Problem Relation Age of Onset  . Arthritis Mother   . Arthritis Father   . Alcohol abuse Maternal Grandmother   . Hyperlipidemia Maternal Grandmother   . Stroke Maternal Grandmother   . Hyperlipidemia Maternal Grandfather   . Hyperlipidemia Paternal Grandmother   . Hyperlipidemia Paternal Grandfather   . Hypertension Paternal Grandfather    Mother also has bipolar disorder type II plus anxiety Social History   Socioeconomic History  . Marital status: Married    Spouse name: Not on file  . Number of children: Not on file  . Years of education: Not on file  . Highest education level: Not on file  Occupational History  . Not on file  Social Needs  . Financial resource strain: Not on file  . Food insecurity:    Worry: Not on file    Inability: Not on file  . Transportation needs:    Medical: Not on file    Non-medical: Not on file  Tobacco Use  . Smoking status: Never Smoker  . Smokeless tobacco: Never Used  Substance and Sexual Activity  . Alcohol use: No  . Drug use: No  . Sexual activity: Not on file  Lifestyle  . Physical activity:    Days per week: Not on file    Minutes per session: Not on file  . Stress: Not on file  Relationships  . Social connections:    Talks on phone: Not on file    Gets together: Not on file    Attends religious service: Not on file    Active member of club or organization: Not on file    Attends meetings of clubs or  organizations: Not on file    Relationship status: Not on file  . Intimate partner violence:    Fear of current or ex partner: Not on file    Emotionally abused: Not on file    Physically abused: Not on file    Forced sexual activity: Not on file  Other Topics Concern  . Not on file  Social History Narrative  . Not on file    Past Medical History, Surgical history, Social history, and Family history were reviewed and updated as appropriate.   Please see review of systems for further details on the patient's  review from today.   Objective:   Physical Exam:  There were no vitals taken for this visit.  Physical Exam Neurological:     Mental Status: She is alert and oriented to person, place, and time.     Cranial Nerves: No dysarthria.  Psychiatric:        Attention and Perception: Attention normal.        Mood and Affect: Mood normal.        Speech: Speech normal.        Behavior: Behavior is cooperative.        Thought Content: Thought content normal. Thought content is not paranoid or delusional. Thought content does not include homicidal or suicidal ideation. Thought content does not include homicidal or suicidal plan.        Cognition and Memory: Cognition and memory normal.        Judgment: Judgment normal.     Comments: No manic symptoms.  Insight good.  Occasional anxiety not unreasonable.  She has less intrusive fearful thoughts than she was having last year.     Lab Review:     Component Value Date/Time   NA 138 01/21/2015 1051   K 4.4 01/21/2015 1051   CL 105 01/21/2015 1051   CO2 25 01/21/2015 1051   GLUCOSE 82 01/21/2015 1051   BUN 13 01/21/2015 1051   CREATININE 0.64 01/21/2015 1051   CALCIUM 9.6 01/21/2015 1051   PROT 7.6 01/21/2015 1051   ALBUMIN 4.1 01/21/2015 1051   AST 12 01/21/2015 1051   ALT 15 01/21/2015 1051   ALKPHOS 42 01/21/2015 1051   BILITOT 0.4 01/21/2015 1051   GFRNONAA >90 10/22/2013 2000   GFRAA >90 10/22/2013 2000        Component Value Date/Time   WBC 9.0 11/19/2016 0519   RBC 3.47 (L) 11/19/2016 0519   HGB 10.6 (L) 11/19/2016 0519   HCT 31.5 (L) 11/19/2016 0519   PLT 152 11/19/2016 0519   MCV 90.8 11/19/2016 0519   MCH 30.5 11/19/2016 0519   MCHC 33.7 11/19/2016 0519   RDW 14.5 11/19/2016 0519   LYMPHSABS 2.5 01/21/2015 1051   MONOABS 0.7 01/21/2015 1051   EOSABS 0.1 01/21/2015 1051   BASOSABS 0.0 01/21/2015 1051    No results found for: POCLITH, LITHIUM   No results found for: PHENYTOIN, PHENOBARB, VALPROATE, CBMZ   .res Assessment: Plan:    Bipolar II disorder (HCC)  Generalized anxiety disorder   We discussed symptoms are well controlled at this time.  Tolerating meds well.  Been relatively stable. No indication to change. No mood swings with the hgher dosage of Zoloft. Disc the risk.  Benefit from the increase made last year.  Discussed her observation that lamotrigine at night tends to cause awakening.  She is sleeping better now that she switched it to the morning.  We discussed that she could also alter the timing of sertraline morning or evening whichever she preferred as well.  Counseled patient regarding potential benefits, risks, and side effects of Lamictal to include potential risk of Stevens-Johnson syndrome. Advised patient to stop taking Lamictal and contact office immediately if rash develops and to seek urgent medical attention if rash is severe and/or spreading quickly.  Supportive therapy re: dealing with D's emotional problems and son's developmental delays  This appointment was 15 minutes  FU 6 mos.  Meredith Staggers, MD, DFAPA   Please see After Visit Summary for patient specific instructions.  No future appointments.  No orders of the  defined types were placed in this encounter.     -------------------------------

## 2018-11-06 ENCOUNTER — Other Ambulatory Visit: Payer: Self-pay | Admitting: Psychiatry

## 2018-12-21 ENCOUNTER — Other Ambulatory Visit: Payer: Self-pay | Admitting: Psychiatry

## 2019-02-15 ENCOUNTER — Encounter: Payer: Self-pay | Admitting: Psychiatry

## 2019-02-15 ENCOUNTER — Other Ambulatory Visit: Payer: Self-pay

## 2019-02-15 ENCOUNTER — Ambulatory Visit (INDEPENDENT_AMBULATORY_CARE_PROVIDER_SITE_OTHER): Payer: BC Managed Care – PPO | Admitting: Psychiatry

## 2019-02-15 DIAGNOSIS — F411 Generalized anxiety disorder: Secondary | ICD-10-CM | POA: Diagnosis not present

## 2019-02-15 DIAGNOSIS — F3181 Bipolar II disorder: Secondary | ICD-10-CM | POA: Diagnosis not present

## 2019-02-15 NOTE — Progress Notes (Signed)
Felicia Young [id number] 02-23-1991 28 y.o.    Subjective:   [name] is a 29 y.o. (DOB 12/15/1990) female.  Chief Complaint:  Chief Complaint  Patient presents with  . Follow-up    mood and sleep  . Medication Problem    Anxiety Patient reports no confusion, decreased concentration, nervous/anxious behavior or suicidal ideas.     [name] presents to the office today for follow-up of Bipolar 2, and GAD with obsessional elements.    Last seen May 2020. No meds were changed.  Pretty good overall.  Taking Lamotrigine AM and noted fuse shorter and switched it to night and then insomnia.  Then switched lamotrigine and sertraline to AM and sleep and irritability are better now.  Less vivid dream which would before shake her up emotionally for a few days.    No new meds.  Some days feels in quicksand and hard to get out of it and unmotivated and low energy.  Once going is OK.  Sometimes numbness to emotion esp re intimacy  With husband.  Low libido but doesn't interfere with pleasure in sex.    Anxiety pretty good except bothered by mask in a store.  Mostly DT the breathing issues.  Zoloft increased postpartum related to [name] birth she had flashbacks.    Doing well with Covid overall but stir crazy at times.  Especially with 2 small kids.    Less afternoon slump.  Better energy and motivation. Sleep 8 hours.  Patient reports stable mood without mood swings and denies depressed or irritable moods.  Patient denies any recent difficulty with anxiety.  Patient denies difficulty with sleep initiation or maintenance. Denies appetite disturbance.  Patient reports that energy and motivation have been good.  Patient denies any difficulty with concentration.  Patient denies any suicidal ideation.  Increase the lamotrigine and sertraline to 150mg  each in January 2019.   No mood swings.  [name] was born via emergency C-section November 18, 2017 and swallowed  fluid resulting in hypoxia and a NICU stay of 5 days.  Patient had some PTSD symptoms thereafter but those have largely resolved.  Past Psychiatric Medication Trials:  Lexapro, Depakote, Lamotrigine, Zoloft 150                                                                  Review of Systems:  Review of Systems  Neurological: Negative for tremors and weakness.  Psychiatric/Behavioral: Negative for agitation, behavioral problems, confusion, decreased concentration, dysphoric mood, hallucinations, self-injury, sleep disturbance and suicidal ideas. The patient is not nervous/anxious and is not hyperactive.     Medications: I have reviewed the patient's current medications.  Current Outpatient Medications  Medication Sig Dispense Refill  . albuterol (PROVENTIL HFA;VENTOLIN HFA) 108 (90 BASE) MCG/ACT inhaler Inhale 2 puffs into the lungs every 6 (six) hours as needed for wheezing. 1 Inhaler 0  . lamoTRIgine (LAMICTAL) 150 MG tablet TAKE 1 TABLET BY MOUTH DAILY 90 tablet 1  . Melatonin 5 MG CHEW Chew by mouth.    November 20, 2016 PREVIFEM 0.25-35 MG-MCG tablet     . sertraline (ZOLOFT) 100 MG tablet TAKE 1 AND 1/2 TABLETS BY MOUTH DAILY 135 tablet 1   No current facility-administered medications for this visit.  Medication Side Effects: None, sexual SE  Allergies: No Known Allergies  Past Medical History:  Diagnosis Date  . ALLERGIC RHINITIS    seasonal  . Depression   . Dyslipidemia 01/11/2013   CPX 01/2013 dx - Recommend diet/aerobic exercise changes with weight reduction  . Exercise-induced asthma   . Generalized anxiety disorder   . Migraine   . PCOS (polycystic ovarian syndrome)     Family History  Problem Relation Age of Onset  . Arthritis Mother   . Arthritis Father   . Alcohol abuse Maternal Grandmother   . Hyperlipidemia Maternal Grandmother   . Stroke Maternal Grandmother   . Hyperlipidemia Maternal Grandfather   . Hyperlipidemia Paternal Grandmother   . Hyperlipidemia  Paternal Grandfather   . Hypertension Paternal Grandfather    Mother also has bipolar disorder type II plus anxiety Social History   Socioeconomic History  . Marital status: Married    Spouse name: Not on file  . Number of children: Not on file  . Years of education: Not on file  . Highest education level: Not on file  Occupational History  . Not on file  Social Needs  . Financial resource strain: Not on file  . Food insecurity    Worry: Not on file    Inability: Not on file  . Transportation needs    Medical: Not on file    Non-medical: Not on file  Tobacco Use  . Smoking status: Never Smoker  . Smokeless tobacco: Never Used  Substance and Sexual Activity  . Alcohol use: No  . Drug use: No  . Sexual activity: Not on file  Lifestyle  . Physical activity    Days per week: Not on file    Minutes per session: Not on file  . Stress: Not on file  Relationships  . Social Herbalist on phone: Not on file    Gets together: Not on file    Attends religious service: Not on file    Active member of club or organization: Not on file    Attends meetings of clubs or organizations: Not on file    Relationship status: Not on file  . Intimate partner violence    Fear of current or ex partner: Not on file    Emotionally abused: Not on file    Physically abused: Not on file    Forced sexual activity: Not on file  Other Topics Concern  . Not on file  Social History Narrative  . Not on file    Past Medical History, Surgical history, Social history, and Family history were reviewed and updated as appropriate.   Please see review of systems for further details on the patient's review from today.   Objective:   Physical Exam:  There were no vitals taken for this visit.  Physical Exam Neurological:     Mental Status: She is alert and oriented to person, place, and time.     Cranial Nerves: No dysarthria.  Psychiatric:        Attention and Perception: Attention  normal.        Mood and Affect: Mood normal.        Speech: Speech normal.        Behavior: Behavior is cooperative.        Thought Content: Thought content normal. Thought content is not paranoid or delusional. Thought content does not include homicidal or suicidal ideation. Thought content does not include homicidal or suicidal plan.  Cognition and Memory: Cognition and memory normal.        Judgment: Judgment normal.     Comments: No manic symptoms.  Insight good.  Occasional anxiety not unreasonable.  She has less intrusive fearful thoughts than she was having last year.     Lab Review:     Component Value Date/Time   NA 138 01/21/2015 1051   K 4.4 01/21/2015 1051   CL 105 01/21/2015 1051   CO2 25 01/21/2015 1051   GLUCOSE 82 01/21/2015 1051   BUN 13 01/21/2015 1051   CREATININE 0.64 01/21/2015 1051   CALCIUM 9.6 01/21/2015 1051   PROT 7.6 01/21/2015 1051   ALBUMIN 4.1 01/21/2015 1051   AST 12 01/21/2015 1051   ALT 15 01/21/2015 1051   ALKPHOS 42 01/21/2015 1051   BILITOT 0.4 01/21/2015 1051   GFRNONAA >90 10/22/2013 2000   GFRAA >90 10/22/2013 2000       Component Value Date/Time   WBC 9.0 11/19/2016 0519   RBC 3.47 (L) 11/19/2016 0519   HGB 10.6 (L) 11/19/2016 0519   HCT 31.5 (L) 11/19/2016 0519   PLT 152 11/19/2016 0519   MCV 90.8 11/19/2016 0519   MCH 30.5 11/19/2016 0519   MCHC 33.7 11/19/2016 0519   RDW 14.5 11/19/2016 0519   LYMPHSABS 2.5 01/21/2015 1051   MONOABS 0.7 01/21/2015 1051   EOSABS 0.1 01/21/2015 1051   BASOSABS 0.0 01/21/2015 1051    No results found for: POCLITH, LITHIUM   No results found for: PHENYTOIN, PHENOBARB, VALPROATE, CBMZ   .res Assessment: Plan:    Bipolar II disorder (HCC)  Generalized anxiety disorder   We discussed symptoms are well controlled at this time.  Tolerating meds well.  Been relatively stable. No indication to change. No mood swings with the hgher dosage of Zoloft. Disc the risk.  Benefit from the  increase made last year.  Discussed her observation that lamotrigine at night tends to cause awakening.  She is sleeping better now that she switched it to the morning and also switching Zoloft to the morning reduced excessive vivid dreaming..   Reduce sertraline to 100 mg daily bc stable and having sexual SE.  Discussed the importance of minimizing sexual side effects with the sake of her marriage relationship.  If this is not sufficiently helpful consider switch to Viibryd.  Call if there is any worsening anxiety which is probably the main risk of reducing the sertraline.  She previously was having flashbacks relating to Mavericks traumatic birth and time in the NICU.  She is no longer having those symptoms.  Counseled patient regarding potential benefits, risks, and side effects of Lamictal to include potential risk of Stevens-Johnson syndrome. Advised patient to stop taking Lamictal and contact office immediately if rash develops and to seek urgent medical attention if rash is severe and/or spreading quickly.  Supportive therapy re: dealing with D's emotional problems and son's developmental delays  This appointment was 15 minutes  FU 6 mos.  Meredith Staggersarey Cottle, MD, DFAPA   Please see After Visit Summary for patient specific instructions.  No future appointments.  No orders of the defined types were placed in this encounter.     -------------------------------

## 2019-03-13 DIAGNOSIS — Z130 Encounter for screening for diseases of the blood and blood-forming organs and certain disorders involving the immune mechanism: Secondary | ICD-10-CM | POA: Diagnosis not present

## 2019-03-13 DIAGNOSIS — Z124 Encounter for screening for malignant neoplasm of cervix: Secondary | ICD-10-CM | POA: Diagnosis not present

## 2019-03-13 DIAGNOSIS — Z01419 Encounter for gynecological examination (general) (routine) without abnormal findings: Secondary | ICD-10-CM | POA: Diagnosis not present

## 2019-03-15 DIAGNOSIS — E559 Vitamin D deficiency, unspecified: Secondary | ICD-10-CM | POA: Diagnosis not present

## 2019-03-15 DIAGNOSIS — E7849 Other hyperlipidemia: Secondary | ICD-10-CM | POA: Diagnosis not present

## 2019-03-22 DIAGNOSIS — Z0000 Encounter for general adult medical examination without abnormal findings: Secondary | ICD-10-CM | POA: Diagnosis not present

## 2019-03-22 DIAGNOSIS — E785 Hyperlipidemia, unspecified: Secondary | ICD-10-CM | POA: Diagnosis not present

## 2019-03-22 DIAGNOSIS — Z1331 Encounter for screening for depression: Secondary | ICD-10-CM | POA: Diagnosis not present

## 2019-03-22 DIAGNOSIS — F411 Generalized anxiety disorder: Secondary | ICD-10-CM | POA: Diagnosis not present

## 2019-03-22 DIAGNOSIS — R3121 Asymptomatic microscopic hematuria: Secondary | ICD-10-CM | POA: Diagnosis not present

## 2019-03-22 DIAGNOSIS — E663 Overweight: Secondary | ICD-10-CM | POA: Diagnosis not present

## 2019-07-02 ENCOUNTER — Other Ambulatory Visit: Payer: Self-pay | Admitting: Psychiatry

## 2019-08-15 ENCOUNTER — Encounter: Payer: Self-pay | Admitting: Psychiatry

## 2019-08-15 ENCOUNTER — Ambulatory Visit (INDEPENDENT_AMBULATORY_CARE_PROVIDER_SITE_OTHER): Payer: BC Managed Care – PPO | Admitting: Psychiatry

## 2019-08-15 ENCOUNTER — Other Ambulatory Visit: Payer: Self-pay

## 2019-08-15 DIAGNOSIS — T887XXA Unspecified adverse effect of drug or medicament, initial encounter: Secondary | ICD-10-CM

## 2019-08-15 DIAGNOSIS — F3181 Bipolar II disorder: Secondary | ICD-10-CM

## 2019-08-15 DIAGNOSIS — F4310 Post-traumatic stress disorder, unspecified: Secondary | ICD-10-CM

## 2019-08-15 DIAGNOSIS — F411 Generalized anxiety disorder: Secondary | ICD-10-CM

## 2019-08-15 NOTE — Progress Notes (Signed)
Felicia Young [id number] November 03, 1990 29 y.o.    Subjective:   [name] is a 29 y.o. (DOB 02-02-1991) female.  Chief Complaint:  Chief Complaint  Patient presents with  . Follow-up  . Medication Problem    Anxiety Patient reports no confusion, decreased concentration, dizziness, nervous/anxious behavior or suicidal ideas.     [name] presents to the office today for follow-up of Bipolar 2, and GAD with obsessional elements.    Last seen Nov 2020. Sertraline was reduced from 150 to 100 to reduce sexual SE.  Tooo irritable after a month.  Better with 150 mg daily.  Irritability generally managed.  Pretty good overall.  Taking Lamotrigine AM and noted fuse shorter and switched it to night and then insomnia.  Then switched lamotrigine and sertraline to AM and sleep and irritability are better now.  Less vivid dream which would before shake her up emotionally for a few days.  Periods of being overwhelmed still.  No new meds.  Some days feels in quicksand and hard to get out of it and unmotivated and low energy.  Once going is OK.  Sometimes numbness to emotion esp re intimacy  With husband.  Low libido but doesn't interfere with pleasure in sex.    Anxiety pretty good except bothered by mask in a store.  Mostly DT the breathing issues.  Zoloft increased postpartum related to [name] birth she had flashbacks.    Especially with 2 small kids. 2 weeks gone back to gym    Less afternoon slump.  Better energy and motivation. Sleep 8 hours.  Patient reports stable mood without mood swings and denies depressed or irritable moods.  Patient denies any recent difficulty with anxiety.  Patient denies difficulty with sleep initiation or maintenance. Denies appetite disturbance.  Patient reports that energy and motivation have been good.  Patient denies any difficulty with concentration.  Patient denies any suicidal ideation.  Increase the lamotrigine and sertraline to  150mg  each in January 2019.   No mood swings.  [name] was born via emergency C-section November 18, 2017 and swallowed fluid resulting in hypoxia and a NICU stay of 5 days.  Patient had some PTSD symptoms thereafter but those have largely resolved.  Past Psychiatric Medication Trials:  Lexapro, Depakote, Lamotrigine, Zoloft 150                                                                  Review of Systems:  Review of Systems  Gastrointestinal: Negative for diarrhea.  Neurological: Negative for dizziness, tremors and weakness.  Psychiatric/Behavioral: Negative for agitation, behavioral problems, confusion, decreased concentration, dysphoric mood, hallucinations, self-injury, sleep disturbance and suicidal ideas. The patient is not nervous/anxious and is not hyperactive.     Medications: I have reviewed the patient's current medications.  Current Outpatient Medications  Medication Sig Dispense Refill  . albuterol (PROVENTIL HFA;VENTOLIN HFA) 108 (90 BASE) MCG/ACT inhaler Inhale 2 puffs into the lungs every 6 (six) hours as needed for wheezing. 1 Inhaler 0  . lamoTRIgine (LAMICTAL) 150 MG tablet TAKE 1 TABLET BY MOUTH DAILY 90 tablet 1  . Melatonin 5 MG CHEW Chew by mouth.    November 20, 2016 PREVIFEM 0.25-35 MG-MCG tablet     . sertraline (ZOLOFT)  100 MG tablet Take 1 tablet (100 mg total) by mouth daily. 90 tablet 0   No current facility-administered medications for this visit.    Medication Side Effects: None, sexual SE  Allergies: No Known Allergies  Past Medical History:  Diagnosis Date  . ALLERGIC RHINITIS    seasonal  . Depression   . Dyslipidemia 01/11/2013   CPX 01/2013 dx - Recommend diet/aerobic exercise changes with weight reduction  . Exercise-induced asthma   . Generalized anxiety disorder   . Migraine   . PCOS (polycystic ovarian syndrome)     Family History  Problem Relation Age of Onset  . Arthritis Mother   . Arthritis Father   . Alcohol abuse Maternal Grandmother    . Hyperlipidemia Maternal Grandmother   . Stroke Maternal Grandmother   . Hyperlipidemia Maternal Grandfather   . Hyperlipidemia Paternal Grandmother   . Hyperlipidemia Paternal Grandfather   . Hypertension Paternal Grandfather    Mother also has bipolar disorder type II plus anxiety Social History   Socioeconomic History  . Marital status: Married    Spouse name: Not on file  . Number of children: Not on file  . Years of education: Not on file  . Highest education level: Not on file  Occupational History  . Not on file  Tobacco Use  . Smoking status: Never Smoker  . Smokeless tobacco: Never Used  Substance and Sexual Activity  . Alcohol use: No  . Drug use: No  . Sexual activity: Not on file  Other Topics Concern  . Not on file  Social History Narrative  . Not on file   Social Determinants of Health   Financial Resource Strain:   . Difficulty of Paying Living Expenses:   Food Insecurity:   . Worried About Programme researcher, broadcasting/film/video in the Last Year:   . Barista in the Last Year:   Transportation Needs:   . Freight forwarder (Medical):   Marland Kitchen Lack of Transportation (Non-Medical):   Physical Activity:   . Days of Exercise per Week:   . Minutes of Exercise per Session:   Stress:   . Feeling of Stress :   Social Connections:   . Frequency of Communication with Friends and Family:   . Frequency of Social Gatherings with Friends and Family:   . Attends Religious Services:   . Active Member of Clubs or Organizations:   . Attends Banker Meetings:   Marland Kitchen Marital Status:   Intimate Partner Violence:   . Fear of Current or Ex-Partner:   . Emotionally Abused:   Marland Kitchen Physically Abused:   . Sexually Abused:     Past Medical History, Surgical history, Social history, and Family history were reviewed and updated as appropriate.   Please see review of systems for further details on the patient's review from today.   Objective:   Physical Exam:  There  were no vitals taken for this visit.  Physical Exam Neurological:     Mental Status: She is alert and oriented to person, place, and time.     Cranial Nerves: No dysarthria.  Psychiatric:        Attention and Perception: Attention normal.        Mood and Affect: Mood is anxious. Mood is not depressed.        Speech: Speech normal.        Behavior: Behavior is cooperative.        Thought Content: Thought content normal. Thought  content is not paranoid or delusional. Thought content does not include homicidal or suicidal ideation. Thought content does not include homicidal or suicidal plan.        Cognition and Memory: Cognition and memory normal.        Judgment: Judgment normal.     Comments: No manic symptoms.  Insight good.  Occasional anxiety not unreasonable.  She has less intrusive fearful thoughts than she was having last year.     Lab Review:     Component Value Date/Time   NA 138 01/21/2015 1051   K 4.4 01/21/2015 1051   CL 105 01/21/2015 1051   CO2 25 01/21/2015 1051   GLUCOSE 82 01/21/2015 1051   BUN 13 01/21/2015 1051   CREATININE 0.64 01/21/2015 1051   CALCIUM 9.6 01/21/2015 1051   PROT 7.6 01/21/2015 1051   ALBUMIN 4.1 01/21/2015 1051   AST 12 01/21/2015 1051   ALT 15 01/21/2015 1051   ALKPHOS 42 01/21/2015 1051   BILITOT 0.4 01/21/2015 1051   GFRNONAA >90 10/22/2013 2000   GFRAA >90 10/22/2013 2000       Component Value Date/Time   WBC 9.0 11/19/2016 0519   RBC 3.47 (L) 11/19/2016 0519   HGB 10.6 (L) 11/19/2016 0519   HCT 31.5 (L) 11/19/2016 0519   PLT 152 11/19/2016 0519   MCV 90.8 11/19/2016 0519   MCH 30.5 11/19/2016 0519   MCHC 33.7 11/19/2016 0519   RDW 14.5 11/19/2016 0519   LYMPHSABS 2.5 01/21/2015 1051   MONOABS 0.7 01/21/2015 1051   EOSABS 0.1 01/21/2015 1051   BASOSABS 0.0 01/21/2015 1051    No results found for: POCLITH, LITHIUM   No results found for: PHENYTOIN, PHENOBARB, VALPROATE, CBMZ   .res Assessment: Plan:    Bipolar II  disorder (HCC)  Generalized anxiety disorder  Side effect of medication  PTSD (post-traumatic stress disorder)   We discussed symptoms are well controlled at this time.  Tolerating meds well.  Been relatively stable. No indication to change. No mood swings with the hgher dosage of Zoloft. Disc the risk.  Benefit from the increase made last year.  Discussed her observation that lamotrigine at night tends to cause awakening.  She is sleeping better now that she switched it to the morning and also switching Zoloft to the morning reduced excessive vivid dreaming..     Discussed the importance of minimizing sexual side effects with the sake of her marriage relationship.  Failed attempt to reduce sertraline 150 mg.  consider switch to Viibryd.  Disc SE in detail and risk relaps and mood swings.  Call if there is any worsening anxiety which is probably the main risk of reducing the sertraline.  She previously was having flashbacks relating to Mavericks traumatic birth and time in the NICU.  She is occ having those symptoms.  Still some anxiety around [name] probably PTSD like.  Counseled patient regarding potential benefits, risks, and side effects of Lamictal to include potential risk of Stevens-Johnson syndrome. Advised patient to stop taking Lamictal and contact office immediately if rash develops and to seek urgent medical attention if rash is severe and/or spreading quickly.  Supportive therapy re: dealing with D's emotional problems and son's developmental delays  The main deal with this start Viibryd 10 mg tablets daily with at least 350 cal and reduce sertraline to one of the 100 mg tablets daily for 1 week. Then increase Viibryd to 20 mg daily and reduce sertraline to 50 mg daily for 10 days  and then stop sertraline.  FU 6 weeks then will.  Lynder Parents, MD, DFAPA   Please see After Visit Summary for patient specific instructions.  No future appointments.  No orders of the defined  types were placed in this encounter.     -------------------------------

## 2019-08-15 NOTE — Patient Instructions (Signed)
Start Viibryd 10 mg tablets daily with at least 350 cal and reduce sertraline to one of the 100 mg tablets daily for 1 week. Then increase Viibryd to 20 mg daily and reduce sertraline to 50 mg daily for 10 days and then stop sertraline.

## 2019-08-30 ENCOUNTER — Other Ambulatory Visit: Payer: Self-pay | Admitting: Psychiatry

## 2019-09-12 ENCOUNTER — Telehealth: Payer: Self-pay | Admitting: Psychiatry

## 2019-09-12 NOTE — Telephone Encounter (Signed)
Patient called  And said that she started taking the viibryd 20 mg and said the medicine is making her fingers tingle and dizzy. She thinks the medicine needs to be tweaked. Please give her a call at [phone]

## 2019-09-12 NOTE — Telephone Encounter (Signed)
This is could as easily be still some element of Zoloft withdrawal as it could be side effects of Viibryd.  I would suggest we do a compromise and reduce Viibryd to 15 mg for 1 more week and then go back to 20 mg daily But also take a quarter of her current sertraline tablets or about 12.5 mg daily for for 5 days and then stop it.

## 2019-09-13 NOTE — Telephone Encounter (Signed)
Given instructions, she verbalized understanding. Pulled samples for her to use, her husband will come pick those up.

## 2019-09-27 ENCOUNTER — Ambulatory Visit (INDEPENDENT_AMBULATORY_CARE_PROVIDER_SITE_OTHER): Payer: Self-pay | Admitting: Psychiatry

## 2019-09-27 ENCOUNTER — Other Ambulatory Visit: Payer: Self-pay

## 2019-09-27 ENCOUNTER — Encounter: Payer: Self-pay | Admitting: Psychiatry

## 2019-09-27 DIAGNOSIS — F4310 Post-traumatic stress disorder, unspecified: Secondary | ICD-10-CM

## 2019-09-27 DIAGNOSIS — F3181 Bipolar II disorder: Secondary | ICD-10-CM

## 2019-09-27 DIAGNOSIS — F411 Generalized anxiety disorder: Secondary | ICD-10-CM

## 2019-09-27 MED ORDER — VIIBRYD 20 MG PO TABS: 30.0000 mg | ORAL_TABLET | Freq: Every day | ORAL | 1 refills | Status: DC

## 2019-09-27 NOTE — Progress Notes (Signed)
Felicia Young [id number] [birth year] 29 y.o.    Subjective:   [name] is a 29 y.o. (DOB 19-Mar-1991) female.  Chief Complaint:  Chief Complaint  Patient presents with  . Follow-up  . Anxiety  . Medication Problem    switch meds    Anxiety Patient reports no confusion, decreased concentration, dizziness, nervous/anxious behavior or suicidal ideas.     Felicia Young presents to the office today for follow-up of Bipolar 2, and GAD with obsessional elements.    Last seen Nov 2020. Sertraline was reduced from 150 to 100 to reduce sexual SE.  Tooo irritable after a month.  Better with 150 mg daily.  Irritability generally managed.  08/15/2019 appointment with the following noted: Pretty good overall.  Taking Lamotrigine AM and noted fuse shorter and switched it to night and then insomnia.  Then switched lamotrigine and sertraline to AM and sleep and irritability are better now.  Less vivid dream which would before shake her up emotionally for a few days.  Periods of being overwhelmed still. Some days feels in quicksand and hard to get out of it and unmotivated and low energy.  Once going is OK.  Sometimes numbness to emotion esp re intimacy  With husband.  Low libido but doesn't interfere with pleasure in sex.   Anxiety pretty good except bothered by mask in a store.  Mostly DT the breathing issues. Zoloft increased postpartum related to [name] birth she had flashbacks.   Especially with 2 small kids. 2 weeks gone back to gym   Less afternoon slump.  Better energy and motivation. Sleep 8 hours. Plan: Due to sexual side effects attempt transition from sertraline to [place].  09/12/2019 appointment select appointment phone call patient complaining of some tingling and dizziness immediately after transition to Viibryd 20 mg daily also with some sweating.  Did some improvement in more normal emotionality.  Was felt to could potentially be some residual symptoms of  serotonin withdrawal and to take a low dose of sertraline for 5 days and then stop it again.  09/27/2019 appointment with the following noted: Dizziness and tingly resolved with switch to Viibryd 20 and extra sertraline. Melene Plan is shorter than on Zoloft.  Quick to anger.  Positive however is feeling emotions otherwise more normally.  Tearful with happy things.  Less numbed.  More enjoyment and interest sexually but not a lot of motivation. More emotionally connected to husband. No mood swings. Feels more productive and energetic so far too.  [name] was born via emergency C-section November 18, 2017 and swallowed fluid resulting in hypoxia and a NICU stay of 5 days.  Patient had some PTSD symptoms thereafter but those have largely resolved.  Past Psychiatric Medication Trials:  Lexapro, Depakote, Lamotrigine, Zoloft 150                                               Increase the lamotrigine and sertraline to 150mg  each in January 2019.                    Review of Systems:  Review of Systems  Gastrointestinal: Negative for diarrhea.  Neurological: Negative for dizziness, tremors and weakness.  Psychiatric/Behavioral: Negative for agitation, behavioral problems, confusion, decreased concentration, dysphoric mood, hallucinations, self-injury, sleep disturbance and suicidal ideas. The patient is not nervous/anxious and is  not hyperactive.     Medications: I have reviewed the patient's current medications.  Current Outpatient Medications  Medication Sig Dispense Refill  . albuterol (PROVENTIL HFA;VENTOLIN HFA) 108 (90 BASE) MCG/ACT inhaler Inhale 2 puffs into the lungs every 6 (six) hours as needed for wheezing. 1 Inhaler 0  . lamoTRIgine (LAMICTAL) 150 MG tablet TAKE 1 TABLET BY MOUTH DAILY 90 tablet 0  . Melatonin 5 MG CHEW Chew by mouth.    Marland Kitchen PREVIFEM 0.25-35 MG-MCG tablet     . Vilazodone HCl (VIIBRYD) 20 MG TABS Take 1.5 tablets (30 mg total) by mouth daily. 90 tablet 1   No current  facility-administered medications for this visit.    Medication Side Effects: None, sexual SE  Allergies: No Known Allergies  Past Medical History:  Diagnosis Date  . ALLERGIC RHINITIS    seasonal  . Depression   . Dyslipidemia 01/11/2013   CPX 01/2013 dx - Recommend diet/aerobic exercise changes with weight reduction  . Exercise-induced asthma   . Generalized anxiety disorder   . Migraine   . PCOS (polycystic ovarian syndrome)     Family History  Problem Relation Age of Onset  . Arthritis Mother   . Arthritis Father   . Alcohol abuse Maternal Grandmother   . Hyperlipidemia Maternal Grandmother   . Stroke Maternal Grandmother   . Hyperlipidemia Maternal Grandfather   . Hyperlipidemia Paternal Grandmother   . Hyperlipidemia Paternal Grandfather   . Hypertension Paternal Grandfather    Mother also has bipolar disorder type II plus anxiety Social History   Socioeconomic History  . Marital status: Married    Spouse name: Not on file  . Number of children: Not on file  . Years of education: Not on file  . Highest education level: Not on file  Occupational History  . Not on file  Tobacco Use  . Smoking status: Never Smoker  . Smokeless tobacco: Never Used  Substance and Sexual Activity  . Alcohol use: No  . Drug use: No  . Sexual activity: Not on file  Other Topics Concern  . Not on file  Social History Narrative  . Not on file   Social Determinants of Health   Financial Resource Strain:   . Difficulty of Paying Living Expenses:   Food Insecurity:   . Worried About Programme researcher, broadcasting/film/video in the Last Year:   . Barista in the Last Year:   Transportation Needs:   . Freight forwarder (Medical):   Marland Kitchen Lack of Transportation (Non-Medical):   Physical Activity:   . Days of Exercise per Week:   . Minutes of Exercise per Session:   Stress:   . Feeling of Stress :   Social Connections:   . Frequency of Communication with Friends and Family:   .  Frequency of Social Gatherings with Friends and Family:   . Attends Religious Services:   . Active Member of Clubs or Organizations:   . Attends Banker Meetings:   Marland Kitchen Marital Status:   Intimate Partner Violence:   . Fear of Current or Ex-Partner:   . Emotionally Abused:   Marland Kitchen Physically Abused:   . Sexually Abused:     Past Medical History, Surgical history, Social history, and Family history were reviewed and updated as appropriate.   Please see review of systems for further details on the patient's review from today.   Objective:   Physical Exam:  There were no vitals taken for this visit.  Physical Exam Constitutional:      General: She is not in acute distress. Musculoskeletal:        General: No deformity.  Neurological:     Mental Status: She is alert and oriented to person, place, and time.     Cranial Nerves: No dysarthria.     Coordination: Coordination normal.  Psychiatric:        Attention and Perception: Attention and perception normal. She does not perceive auditory or visual hallucinations.        Mood and Affect: Mood is not anxious or depressed. Affect is not labile, blunt, angry or inappropriate.        Speech: Speech normal.        Behavior: Behavior normal. Behavior is cooperative.        Thought Content: Thought content normal. Thought content is not paranoid or delusional. Thought content does not include homicidal or suicidal ideation. Thought content does not include homicidal or suicidal plan.        Cognition and Memory: Cognition and memory normal.        Judgment: Judgment normal.     Comments: No manic symptoms.  Insight good.  Occasional anxiety not unreasonable.  She has less intrusive fearful thoughts than she was having last year. More irritability than with Zoloft     Lab Review:     Component Value Date/Time   NA 138 01/21/2015 1051   K 4.4 01/21/2015 1051   CL 105 01/21/2015 1051   CO2 25 01/21/2015 1051   GLUCOSE 82  01/21/2015 1051   BUN 13 01/21/2015 1051   CREATININE 0.64 01/21/2015 1051   CALCIUM 9.6 01/21/2015 1051   PROT 7.6 01/21/2015 1051   ALBUMIN 4.1 01/21/2015 1051   AST 12 01/21/2015 1051   ALT 15 01/21/2015 1051   ALKPHOS 42 01/21/2015 1051   BILITOT 0.4 01/21/2015 1051   GFRNONAA >90 10/22/2013 2000   GFRAA >90 10/22/2013 2000       Component Value Date/Time   WBC 9.0 11/19/2016 0519   RBC 3.47 (L) 11/19/2016 0519   HGB 10.6 (L) 11/19/2016 0519   HCT 31.5 (L) 11/19/2016 0519   PLT 152 11/19/2016 0519   MCV 90.8 11/19/2016 0519   MCH 30.5 11/19/2016 0519   MCHC 33.7 11/19/2016 0519   RDW 14.5 11/19/2016 0519   LYMPHSABS 2.5 01/21/2015 1051   MONOABS 0.7 01/21/2015 1051   EOSABS 0.1 01/21/2015 1051   BASOSABS 0.0 01/21/2015 1051    No results found for: POCLITH, LITHIUM   No results found for: PHENYTOIN, PHENOBARB, VALPROATE, CBMZ   .res Assessment: Plan:    Bipolar II disorder (HCC)  Generalized anxiety disorder  PTSD (post-traumatic stress disorder)   We discussed symptoms are well controlled at this time.  Tolerating meds well.  Been relatively stable. No indication to change. No mood swings with the hgher dosage of Zoloft. Disc the risk.  Benefit from the increase made last year.  Discussed her observation that lamotrigine at night tends to cause awakening.  She is sleeping better now that she switched it to the morning and also switching Zoloft to the morning reduced excessive vivid dreaming..     Discussed the importance of minimizing sexual side effects with the sake of her marriage relationship.  Failed attempt to reduce sertraline 150 mg.  consider switch to Viibryd.  Disc SE in detail and risk relaps and mood swings.  Call if there is any worsening anxiety which is probably the main  risk of reducing the sertraline.  She previously was having flashbacks relating to Mavericks traumatic birth and time in the NICU.  She is occ having those symptoms.  Still some  anxiety around [name] probably PTSD like.  Counseled patient regarding potential benefits, risks, and side effects of Lamictal to include potential risk of Stevens-Johnson syndrome. Advised patient to stop taking Lamictal and contact office immediately if rash develops and to seek urgent medical attention if rash is severe and/or spreading quickly.  Supportive therapy re: dealing with D's emotional problems and son's developmental delays  increasse Viibryd to 30 mg to help irritability  FU2 mos  Meredith Staggers, MD, DFAPA   Please see After Visit Summary for patient specific instructions.  No future appointments.  No orders of the defined types were placed in this encounter.     -------------------------------

## 2019-10-10 ENCOUNTER — Telehealth: Payer: Self-pay

## 2019-10-10 NOTE — Telephone Encounter (Signed)
Prior authorization submitted for VIIBRYD 20 MG Take 1.5 tablets daily, initial response was denied, then submitted last 2 office notes to [facility] for an appeal as advised as an urgent. They contacted me and thoroughly discussed Dr. Alwyn Ren office notes from 08/15/2019 and 09/27/2019, questioning why she was increased from Viibryd 20 mg to 30 mg after only a month of that dose. If increase is warranted why did the dose not get increased to 40 mg as recommended and that dose commercially available. The medical person I was discussing things with did not see justification on the note from 09/27/2019 requesting the dose be increased to 30 mg. She asked me to reach out to Dr. Jennelle Human if there is anything we can add to get the approval, but as right now she would have to deny it based on the evidence she is reading. She said she will approve 20 mg daily or 40 mg daily if needed.    Contacted patient today and with the increase from 20 mg to 30 mg Viibryd she is feeling much better and her irritability is much improved and not having that since the dose increase. I did explain the insurance issue and that we may have to increase her to 40 mg, she said she is not opposed to do that if that's needed for approval. Informed her if the dose is too much we would have justification that the higher dose is not tolerated. Informed her I would discuss with Dr. Jennelle Human and notify her as soon as I could.  Patient does have samples to get through until decision is made.

## 2019-10-14 NOTE — Telephone Encounter (Signed)
To satisfy the insurance company increase Viibryd to 40 mg daily.  If she has SE problems let us know and we'll try again to get 30 mg covered.

## 2019-10-15 NOTE — Telephone Encounter (Signed)
Advised patient to increase dose to 40 mg Viibryd daily, she agreed. Pulled some samples for her. She does still have 1 sample pack left, she will come by tomorrow. Instructed her to call back with worsening signs and symptoms.

## 2019-11-11 ENCOUNTER — Other Ambulatory Visit: Payer: Self-pay

## 2019-11-11 ENCOUNTER — Telehealth: Payer: Self-pay | Admitting: Psychiatry

## 2019-11-11 MED ORDER — VIIBRYD 40 MG PO TABS: 40.0000 mg | ORAL_TABLET | Freq: Every day | ORAL | 0 refills | Status: DC

## 2019-11-11 NOTE — Progress Notes (Signed)
Patient increased to 40 mg Viibryd due to insurance denying request for 30 mg dose. It is not available in a 30 mg tablet and insurance wants patient to try 40 mg to see if tolerated. She did do well with samples given for 40 mg and asked for updated Rx sent to her pharmacy.

## 2019-11-11 NOTE — Telephone Encounter (Signed)
Updated Rx sent to Viibryd 40 mg 1 daily.

## 2019-11-11 NOTE — Telephone Encounter (Signed)
Pt called and got more samples 2 boxes of the viibryd 40 mg . She said it was working and would like a script of the 40 mg to be sent into the pharmacy [facility]

## 2019-11-21 ENCOUNTER — Telehealth: Payer: Self-pay | Admitting: Psychiatry

## 2019-11-21 NOTE — Telephone Encounter (Signed)
Felicia Young called to report that the switch from sertraline to viibryd is not working because [employer] cover the viibryd.  She would like to wean off the Viibryd and go back on the Sertraline.  She still has some Sertraline so it doesn't need to be called in.  She only has a few of the Viibryd left from her samples so she may need more to wean off.  Please call to discuss

## 2019-11-22 NOTE — Telephone Encounter (Signed)
Her [employer] requested office notes to review further for prior authorization of Viibryd 40 mg, notes faxed this morning. Recommend patient pick up more samples until final decision made.

## 2019-11-22 NOTE — Telephone Encounter (Signed)
Let pt know I was successful getting the insurance company to cover Viibryd.  Should be able to get it filled today.

## 2019-11-22 NOTE — Telephone Encounter (Signed)
Left pt a detailed message and asked to call back with any questions.

## 2019-12-02 ENCOUNTER — Encounter: Payer: Self-pay | Admitting: Psychiatry

## 2019-12-02 ENCOUNTER — Ambulatory Visit (INDEPENDENT_AMBULATORY_CARE_PROVIDER_SITE_OTHER): Payer: 59 | Admitting: Psychiatry

## 2019-12-02 ENCOUNTER — Other Ambulatory Visit: Payer: Self-pay

## 2019-12-02 DIAGNOSIS — F3181 Bipolar II disorder: Secondary | ICD-10-CM

## 2019-12-02 DIAGNOSIS — F411 Generalized anxiety disorder: Secondary | ICD-10-CM

## 2019-12-02 DIAGNOSIS — T887XXA Unspecified adverse effect of drug or medicament, initial encounter: Secondary | ICD-10-CM

## 2019-12-02 DIAGNOSIS — F4310 Post-traumatic stress disorder, unspecified: Secondary | ICD-10-CM

## 2019-12-02 MED ORDER — VIIBRYD 40 MG PO TABS: 40.0000 mg | ORAL_TABLET | Freq: Every day | ORAL | 1 refills | Status: DC

## 2019-12-02 MED ORDER — LAMOTRIGINE 150 MG PO TABS: 150.0000 mg | ORAL_TABLET | Freq: Every day | ORAL | 1 refills | Status: DC

## 2019-12-02 NOTE — Progress Notes (Signed)
Felicia Young [id number] 01-15-1991 29 y.o.    Subjective:   [name] is a 29 y.o. (DOB 01-15-1991) female.  Chief Complaint:  Chief Complaint  Patient presents with  . Follow-up    Medication Management  . Other    Bipolar    Anxiety Symptoms include nervous/anxious behavior. Patient reports no confusion, decreased concentration, dizziness or suicidal ideas.     Felicia Young presents to the office today for follow-up of Bipolar 2, and GAD with obsessional elements.    Last seen Nov 2020. Sertraline was reduced from 150 to 100 to reduce sexual SE.  Tooo irritable after a month.  Better with 150 mg daily.  Irritability generally managed.  08/15/2019 appointment with the following noted: Pretty good overall.  Taking Lamotrigine AM and noted fuse shorter and switched it to night and then insomnia.  Then switched lamotrigine and sertraline to AM and sleep and irritability are better now.  Less vivid dream which would before shake her up emotionally for a few days.  Periods of being overwhelmed still. Some days feels in quicksand and hard to get out of it and unmotivated and low energy.  Once going is OK.  Sometimes numbness to emotion esp re intimacy  With husband.  Low libido but doesn't interfere with pleasure in sex.   Anxiety pretty good except bothered by mask in a store.  Mostly DT the breathing issues. Zoloft increased postpartum related to [name] birth she had flashbacks.   Especially with 2 small kids. 2 weeks gone back to gym   Less afternoon slump.  Better energy and motivation. Sleep 8 hours. Plan: Due to sexual side effects attempt transition from sertraline to Viibryd.  09/12/2019 appointment select appointment phone call patient complaining of some tingling and dizziness immediately after transition to Viibryd 20 mg daily also with some sweating.  Did some improvement in more normal emotionality.  Was felt to could potentially be some residual  symptoms of serotonin withdrawal and to take a low dose of sertraline for 5 days and then stop it again.  09/27/2019 appointment with the following noted: Dizziness and tingly resolved with switch to Viibryd 20 and extra sertraline. Wilford GristFuse is shorter than on Zoloft.  Quick to anger.  Positive however is feeling emotions otherwise more normally.  Tearful with happy things.  Less numbed.  More enjoyment and interest sexually but not a lot of motivation. More emotionally connected to husband. No mood swings. Feels more productive and energetic so far too. Plan: increasse Viibryd to 30 mg to help irritability  12/02/19 appt with the following noted: Since last appt had to do peer-peer appeal of denial of coverage for Viibryrd which was successful. Now on Viibryd 40 mg daily for about a month. Doing pretty good with it.  Feels pretty level and better than at lower dose with less anger and short-fused and less irritable.  Don't feel like a zombie or super numb but don't feel like she'll snap.  Better with numbness vs Zoloft.  Less sexual SE than Zoloft but kids interfere.   Not depressed.  Some anxiety.  Some worry over baby's breathing and will check repeatedly but usually can let go after a while.  [name] was born via emergency C-section November 18, 2017 and swallowed fluid resulting in hypoxia and a NICU stay of 5 days.  Patient had some PTSD symptoms thereafter but those have largely resolved.  Past Psychiatric Medication Trials:  Lexapro, Depakote, Lamotrigine, Zoloft  150                                               Increase the lamotrigine and sertraline to 150mg  each in January 2019.                    Review of Systems:  Review of Systems  Gastrointestinal: Negative for diarrhea.  Neurological: Negative for dizziness, tremors and weakness.  Psychiatric/Behavioral: Negative for agitation, behavioral problems, confusion, decreased concentration, dysphoric mood, hallucinations, self-injury,  sleep disturbance and suicidal ideas. The patient is nervous/anxious. The patient is not hyperactive.     Medications: I have reviewed the patient's current medications.  Current Outpatient Medications  Medication Sig Dispense Refill  . albuterol (PROVENTIL HFA;VENTOLIN HFA) 108 (90 BASE) MCG/ACT inhaler Inhale 2 puffs into the lungs every 6 (six) hours as needed for wheezing. 1 Inhaler 0  . PREVIFEM 0.25-35 MG-MCG tablet     . lamoTRIgine (LAMICTAL) 150 MG tablet Take 1 tablet (150 mg total) by mouth daily. 90 tablet 1  . Vilazodone HCl (VIIBRYD) 40 MG TABS Take 1 tablet (40 mg total) by mouth daily. 90 tablet 1   No current facility-administered medications for this visit.    Medication Side Effects: None, sexual SE  Allergies: No Known Allergies  Past Medical History:  Diagnosis Date  . ALLERGIC RHINITIS    seasonal  . Depression   . Dyslipidemia 01/11/2013   CPX 01/2013 dx - Recommend diet/aerobic exercise changes with weight reduction  . Exercise-induced asthma   . Generalized anxiety disorder   . Migraine   . PCOS (polycystic ovarian syndrome)     Family History  Problem Relation Age of Onset  . Arthritis Mother   . Arthritis Father   . Alcohol abuse Maternal Grandmother   . Hyperlipidemia Maternal Grandmother   . Stroke Maternal Grandmother   . Hyperlipidemia Maternal Grandfather   . Hyperlipidemia Paternal Grandmother   . Hyperlipidemia Paternal Grandfather   . Hypertension Paternal Grandfather    Mother also has bipolar disorder type II plus anxiety Social History   Socioeconomic History  . Marital status: Married    Spouse name: Not on file  . Number of children: Not on file  . Years of education: Not on file  . Highest education level: Not on file  Occupational History  . Not on file  Tobacco Use  . Smoking status: Never Smoker  . Smokeless tobacco: Never Used  Substance and Sexual Activity  . Alcohol use: No  . Drug use: No  . Sexual activity:  Not on file  Other Topics Concern  . Not on file  Social History Narrative  . Not on file   Social Determinants of Health   Financial Resource Strain:   . Difficulty of Paying Living Expenses: Not on file  Food Insecurity:   . Worried About 02/2013 in the Last Year: Not on file  . Ran Out of Food in the Last Year: Not on file  Transportation Needs:   . Lack of Transportation (Medical): Not on file  . Lack of Transportation (Non-Medical): Not on file  Physical Activity:   . Days of Exercise per Week: Not on file  . Minutes of Exercise per Session: Not on file  Stress:   . Feeling of Stress : Not on file  Social  Connections:   . Frequency of Communication with Friends and Family: Not on file  . Frequency of Social Gatherings with Friends and Family: Not on file  . Attends Religious Services: Not on file  . Active Member of Clubs or Organizations: Not on file  . Attends Banker Meetings: Not on file  . Marital Status: Not on file  Intimate Partner Violence:   . Fear of Current or Ex-Partner: Not on file  . Emotionally Abused: Not on file  . Physically Abused: Not on file  . Sexually Abused: Not on file    Past Medical History, Surgical history, Social history, and Family history were reviewed and updated as appropriate.   Please see review of systems for further details on the patient's review from today.   Objective:   Physical Exam:  There were no vitals taken for this visit.  Physical Exam Constitutional:      General: She is not in acute distress. Musculoskeletal:        General: No deformity.  Neurological:     Mental Status: She is alert and oriented to person, place, and time.     Cranial Nerves: No dysarthria.     Coordination: Coordination normal.  Psychiatric:        Attention and Perception: Attention and perception normal. She does not perceive auditory or visual hallucinations.        Mood and Affect: Mood is anxious. Mood is  not depressed. Affect is not labile, blunt, angry or inappropriate.        Speech: Speech normal.        Behavior: Behavior normal. Behavior is cooperative.        Thought Content: Thought content normal. Thought content is not paranoid or delusional. Thought content does not include homicidal or suicidal ideation. Thought content does not include homicidal or suicidal plan.        Cognition and Memory: Cognition and memory normal.        Judgment: Judgment normal.     Comments: No manic symptoms.  Insight good.  She has less intrusive fearful thoughts than she was having last year. More irritability than with Zoloft but now managed with Viiibryd 40.     Lab Review:     Component Value Date/Time   NA 138 01/21/2015 1051   K 4.4 01/21/2015 1051   CL 105 01/21/2015 1051   CO2 25 01/21/2015 1051   GLUCOSE 82 01/21/2015 1051   BUN 13 01/21/2015 1051   CREATININE 0.64 01/21/2015 1051   CALCIUM 9.6 01/21/2015 1051   PROT 7.6 01/21/2015 1051   ALBUMIN 4.1 01/21/2015 1051   AST 12 01/21/2015 1051   ALT 15 01/21/2015 1051   ALKPHOS 42 01/21/2015 1051   BILITOT 0.4 01/21/2015 1051   GFRNONAA >90 10/22/2013 2000   GFRAA >90 10/22/2013 2000       Component Value Date/Time   WBC 9.0 11/19/2016 0519   RBC 3.47 (L) 11/19/2016 0519   HGB 10.6 (L) 11/19/2016 0519   HCT 31.5 (L) 11/19/2016 0519   PLT 152 11/19/2016 0519   MCV 90.8 11/19/2016 0519   MCH 30.5 11/19/2016 0519   MCHC 33.7 11/19/2016 0519   RDW 14.5 11/19/2016 0519   LYMPHSABS 2.5 01/21/2015 1051   MONOABS 0.7 01/21/2015 1051   EOSABS 0.1 01/21/2015 1051   BASOSABS 0.0 01/21/2015 1051    No results found for: POCLITH, LITHIUM   No results found for: PHENYTOIN, PHENOBARB, VALPROATE, CBMZ   .res  Assessment: Plan:    Bipolar II disorder (HCC) - Plan: lamoTRIgine (LAMICTAL) 150 MG tablet  Generalized anxiety disorder - Plan: Vilazodone HCl (VIIBRYD) 40 MG TABS  PTSD (post-traumatic stress disorder) - Plan: Vilazodone  HCl (VIIBRYD) 40 MG TABS  Side effect of medication   We discussed symptoms are well controlled at this time.  Tolerating meds well.  Been relatively stable. No indication to change. No mood swings with the hgher dosage of Zoloft. Disc the risk.  Benefit from the increase made last year.  Residual anxiety about [name] getting sick and throw up.  Always some issues with it.  Discussed her observation that lamotrigine at night tends to cause awakening.  She is sleeping better now that she switched it to the morning and also switching Zoloft to the morning reduced excessive vivid dreaming..   Counseled patient regarding potential benefits, risks, and side effects of Lamictal to include potential risk of Stevens-Johnson syndrome. Advised patient to stop taking Lamictal and contact office immediately if rash develops and to seek urgent medical attention if rash is severe and/or spreading quickly.  Supportive therapy re: dealing with D's emotional problems and son's developmental delays  increasse Viibryd to 40 mg to help irritability was successful but residual anxiety. Vomiting phobia which D notices.  No med changes.  Phobia difficult to treat with meds.   FU 3 mos  Meredith Staggers, MD, DFAPA   Please see After Visit Summary for patient specific instructions.  No future appointments.  No orders of the defined types were placed in this encounter.     -------------------------------

## 2020-01-27 ENCOUNTER — Telehealth: Payer: Self-pay | Admitting: Psychiatry

## 2020-01-27 NOTE — Telephone Encounter (Signed)
Pt LM on VM she is having issues with Viibryd, worked for a while.Would like to wean off Viibryd and go back on Sertraline. Pt has a bottle of Sertraline from before. Apt 11/30  Contact @ [phone]

## 2020-01-27 NOTE — Telephone Encounter (Signed)
Rtc to patient and given instructions on tapering both medications. She confirmed understanding and should have plenty of sertraline. Instructed her to call back with any further questions or concerns.

## 2020-01-27 NOTE — Telephone Encounter (Signed)
Okay start sertraline 50 mg daily and reduce Viibryd to one half 40 mg tablet or 20 mg daily for 1 week, then increase sertraline to 100 mg daily and reduce Viibryd to 1/4 tablet daily or 10 mg for 1 week, then increase sertraline to 1-1/2 tablets or 150 mg daily and stop Viibryd.

## 2020-03-03 ENCOUNTER — Encounter: Payer: Self-pay | Admitting: Psychiatry

## 2020-03-03 ENCOUNTER — Ambulatory Visit (INDEPENDENT_AMBULATORY_CARE_PROVIDER_SITE_OTHER): Payer: 59 | Admitting: Psychiatry

## 2020-03-03 ENCOUNTER — Other Ambulatory Visit: Payer: Self-pay

## 2020-03-03 DIAGNOSIS — F411 Generalized anxiety disorder: Secondary | ICD-10-CM | POA: Diagnosis not present

## 2020-03-03 DIAGNOSIS — F3181 Bipolar II disorder: Secondary | ICD-10-CM

## 2020-03-03 DIAGNOSIS — F4310 Post-traumatic stress disorder, unspecified: Secondary | ICD-10-CM

## 2020-03-03 MED ORDER — LAMOTRIGINE 150 MG PO TABS: 150.0000 mg | ORAL_TABLET | Freq: Every day | ORAL | 1 refills | Status: DC

## 2020-03-03 MED ORDER — SERTRALINE HCL 100 MG PO TABS: 100.0000 mg | ORAL_TABLET | Freq: Every day | ORAL | 1 refills | Status: DC

## 2020-03-03 NOTE — Progress Notes (Signed)
Felicia Young [id number] May 19, 1990 29 y.o.    Subjective:   [name] is a 29 y.o. (DOB [birth year]) female.  Chief Complaint:  Chief Complaint  Patient presents with  . Follow-up    med change & mood  . Anxiety    Anxiety Symptoms include nervous/anxious behavior. Patient reports no confusion, decreased concentration, dizziness or suicidal ideas.     Felicia Young presents to the office today for follow-up of Bipolar 2, and GAD with obsessional elements.    Last seen Nov 2020. Sertraline was reduced from 150 to 100 to reduce sexual SE.  Tooo irritable after a month.  Better with 150 mg daily.  Irritability generally managed.  08/15/2019 appointment with the following noted: Pretty good overall.  Taking Lamotrigine AM and noted fuse shorter and switched it to night and then insomnia.  Then switched lamotrigine and sertraline to AM and sleep and irritability are better now.  Less vivid dream which would before shake her up emotionally for a few days.  Periods of being overwhelmed still. Some days feels in quicksand and hard to get out of it and unmotivated and low energy.  Once going is OK.  Sometimes numbness to emotion esp re intimacy  With husband.  Low libido but doesn't interfere with pleasure in sex.   Anxiety pretty good except bothered by mask in a store.  Mostly DT the breathing issues. Zoloft increased postpartum related to Felicia Young's birth she had flashbacks.   Especially with 2 small kids. 2 weeks gone back to gym   Less afternoon slump.  Better energy and motivation. Sleep 8 hours. Plan: Due to sexual side effects attempt transition from sertraline to Viibryd.  09/12/2019 appointment select appointment phone call patient complaining of some tingling and dizziness immediately after transition to Viibryd 20 mg daily also with some sweating.  Did some improvement in more normal emotionality.  Was felt to could potentially be some residual symptoms of  serotonin withdrawal and to take a low dose of sertraline for 5 days and then stop it again.  09/27/2019 appointment with the following noted: Dizziness and tingly resolved with switch to Viibryd 20 and extra sertraline. Felicia Young is shorter than on Zoloft.  Quick to anger.  Positive however is feeling emotions otherwise more normally.  Tearful with happy things.  Less numbed.  More enjoyment and interest sexually but not a lot of motivation. More emotionally connected to husband. No mood swings. Feels more productive and energetic so far too. Plan: increasse Viibryd to 30 mg to help irritability  12/02/19 appt with the following noted: Since last appt had to do peer-peer appeal of denial of coverage for Viibryrd which was successful. Now on Viibryd 40 mg daily for about a month. Doing pretty good with it.  Feels pretty level and better than at lower dose with less anger and short-fused and less irritable.  Don't feel like a zombie or super numb but don't feel like she'll snap.  Better with numbness vs Zoloft.  Less sexual SE than Zoloft but kids interfere.   Not depressed.  Some anxiety.  Some worry over baby's breathing and will check repeatedly but usually can let go after a while. Plan no med changes  03/03/2020 appointment with the following noted: Called in Oct and wanted to swtich back to Zoloft bc irritability and lack of patience and weird dreams. Episode of sleep paralysis when took it at night.  Happened 3 times with 3rd time the  worst. On sertraline 100 irritability and NM resolved.  H noticed Zoloft was better too.   Much more level.   Son disturbs sleep.   At first it felt more normal to be able to cry but that benefit was not worth the rest with Viibryd.  Felicia Young was born via emergency C-section November 18, 2017 and swallowed fluid resulting in hypoxia and a NICU stay of 5 days.  Patient had some PTSD symptoms thereafter but those have largely resolved.  Past Psychiatric Medication  Trials:  Lexapro, viibryd 40 poor response,  Zoloft 150 Depakote, Lamotrigine,                                                Increase the lamotrigine and sertraline to 150mg  each in January 2019.                    Review of Systems:  Review of Systems  Gastrointestinal: Negative for diarrhea.  Neurological: Negative for dizziness, tremors and weakness.  Psychiatric/Behavioral: Negative for agitation, behavioral problems, confusion, decreased concentration, dysphoric mood, hallucinations, self-injury, sleep disturbance and suicidal ideas. The patient is nervous/anxious. The patient is not hyperactive.     Medications: I have reviewed the patient's current medications.  Current Outpatient Medications  Medication Sig Dispense Refill  . albuterol (PROVENTIL HFA;VENTOLIN HFA) 108 (90 BASE) MCG/ACT inhaler Inhale 2 puffs into the lungs every 6 (six) hours as needed for wheezing. 1 Inhaler 0  . lamoTRIgine (LAMICTAL) 150 MG tablet Take 1 tablet (150 mg total) by mouth daily. 90 tablet 1  . PREVIFEM 0.25-35 MG-MCG tablet     . sertraline (ZOLOFT) 100 MG tablet Take 1 tablet (100 mg total) by mouth daily. 90 tablet 1   No current facility-administered medications for this visit.    Medication Side Effects: None, sexual SE  Allergies: No Known Allergies  Past Medical History:  Diagnosis Date  . ALLERGIC RHINITIS    seasonal  . Depression   . Dyslipidemia 01/11/2013   CPX 01/2013 dx - Recommend diet/aerobic exercise changes with weight reduction  . Exercise-induced asthma   . Generalized anxiety disorder   . Migraine   . PCOS (polycystic ovarian syndrome)     Family History  Problem Relation Age of Onset  . Arthritis Mother   . Arthritis Father   . Alcohol abuse Maternal Grandmother   . Hyperlipidemia Maternal Grandmother   . Stroke Maternal Grandmother   . Hyperlipidemia Maternal Grandfather   . Hyperlipidemia Paternal Grandmother   . Hyperlipidemia Paternal Grandfather   .  Hypertension Paternal Grandfather    Mother also has bipolar disorder type II plus anxiety Social History   Socioeconomic History  . Marital status: Married    Spouse name: Not on file  . Number of children: Not on file  . Years of education: Not on file  . Highest education level: Not on file  Occupational History  . Not on file  Tobacco Use  . Smoking status: Never Smoker  . Smokeless tobacco: Never Used  Substance and Sexual Activity  . Alcohol use: No  . Drug use: No  . Sexual activity: Not on file  Other Topics Concern  . Not on file  Social History Narrative  . Not on file   Social Determinants of Health   Financial Resource Strain:   . Difficulty of  Paying Living Expenses: Not on file  Food Insecurity:   . Worried About Programme researcher, broadcasting/film/video in the Last Year: Not on file  . Ran Out of Food in the Last Year: Not on file  Transportation Needs:   . Lack of Transportation (Medical): Not on file  . Lack of Transportation (Non-Medical): Not on file  Physical Activity:   . Days of Exercise per Week: Not on file  . Minutes of Exercise per Session: Not on file  Stress:   . Feeling of Stress : Not on file  Social Connections:   . Frequency of Communication with Friends and Family: Not on file  . Frequency of Social Gatherings with Friends and Family: Not on file  . Attends Religious Services: Not on file  . Active Member of Clubs or Organizations: Not on file  . Attends Banker Meetings: Not on file  . Marital Status: Not on file  Intimate Partner Violence:   . Fear of Current or Ex-Partner: Not on file  . Emotionally Abused: Not on file  . Physically Abused: Not on file  . Sexually Abused: Not on file    Past Medical History, Surgical history, Social history, and Family history were reviewed and updated as appropriate.   Please see review of systems for further details on the patient's review from today.   Objective:   Physical Exam:  There were  no vitals taken for this visit.  Physical Exam Constitutional:      General: She is not in acute distress. Musculoskeletal:        General: No deformity.  Neurological:     Mental Status: She is alert and oriented to person, place, and time.     Cranial Nerves: No dysarthria.     Coordination: Coordination normal.  Psychiatric:        Attention and Perception: Attention and perception normal. She does not perceive auditory or visual hallucinations.        Mood and Affect: Mood is anxious. Mood is not depressed. Affect is not labile, blunt, angry or inappropriate.        Speech: Speech normal.        Behavior: Behavior normal. Behavior is cooperative.        Thought Content: Thought content normal. Thought content is not paranoid or delusional. Thought content does not include homicidal or suicidal ideation. Thought content does not include homicidal or suicidal plan.        Cognition and Memory: Cognition and memory normal.        Judgment: Judgment normal.     Comments: No manic symptoms.  Insight good.  She has less intrusive fearful thoughts than she was having last year. Feels more herself with Zoloft.      Lab Review:     Component Value Date/Time   NA 138 01/21/2015 1051   K 4.4 01/21/2015 1051   CL 105 01/21/2015 1051   CO2 25 01/21/2015 1051   GLUCOSE 82 01/21/2015 1051   BUN 13 01/21/2015 1051   CREATININE 0.64 01/21/2015 1051   CALCIUM 9.6 01/21/2015 1051   PROT 7.6 01/21/2015 1051   ALBUMIN 4.1 01/21/2015 1051   AST 12 01/21/2015 1051   ALT 15 01/21/2015 1051   ALKPHOS 42 01/21/2015 1051   BILITOT 0.4 01/21/2015 1051   GFRNONAA >90 10/22/2013 2000   GFRAA >90 10/22/2013 2000       Component Value Date/Time   WBC 9.0 11/19/2016 0519   RBC 3.47 (  L) 11/19/2016 0519   HGB 10.6 (L) 11/19/2016 0519   HCT 31.5 (L) 11/19/2016 0519   PLT 152 11/19/2016 0519   MCV 90.8 11/19/2016 0519   MCH 30.5 11/19/2016 0519   MCHC 33.7 11/19/2016 0519   RDW 14.5 11/19/2016  0519   LYMPHSABS 2.5 01/21/2015 1051   MONOABS 0.7 01/21/2015 1051   EOSABS 0.1 01/21/2015 1051   BASOSABS 0.0 01/21/2015 1051    No results found for: POCLITH, LITHIUM   No results found for: PHENYTOIN, PHENOBARB, VALPROATE, CBMZ   .res Assessment: Plan:    Bipolar II disorder (HCC) - Plan: lamoTRIgine (LAMICTAL) 150 MG tablet  Generalized anxiety disorder - Plan: sertraline (ZOLOFT) 100 MG tablet  PTSD (post-traumatic stress disorder) - Plan: sertraline (ZOLOFT) 100 MG tablet   We discussed symptoms are well controlled at this time.  Tolerating meds well.  Been relatively stable. No indication to change. Disc the risk.  Benefit from the increase made last year.  Less anxiety over the kids on Zoloft.  Discussed her observation that lamotrigine at night tends to cause awakening.  She is sleeping better now that she switched it to the morning and also switching Zoloft to the morning reduced excessive vivid dreaming..   Counseled patient regarding potential benefits, risks, and side effects of Lamictal to include potential risk of Stevens-Johnson syndrome. Advised patient to stop taking Lamictal and contact office immediately if rash develops and to seek urgent medical attention if rash is severe and/or spreading quickly.  Supportive therapy re: dealing with D's emotional problems and son's developmental delays  Zoloft 100 is working again and better for her. Cont lamotrigine 150 mg daily  No med changes.  Phobia difficult to treat with meds.   FU 3-4 mos  Meredith Staggersarey Cottle, MD, DFAPA   Please see After Visit Summary for patient specific instructions.  No future appointments.  No orders of the defined types were placed in this encounter.     -------------------------------

## 2020-06-29 ENCOUNTER — Encounter: Payer: Self-pay | Admitting: Psychiatry

## 2020-06-29 ENCOUNTER — Ambulatory Visit (INDEPENDENT_AMBULATORY_CARE_PROVIDER_SITE_OTHER): Payer: 59 | Admitting: Psychiatry

## 2020-06-29 ENCOUNTER — Other Ambulatory Visit: Payer: Self-pay

## 2020-06-29 DIAGNOSIS — F3181 Bipolar II disorder: Secondary | ICD-10-CM

## 2020-06-29 DIAGNOSIS — F4310 Post-traumatic stress disorder, unspecified: Secondary | ICD-10-CM | POA: Diagnosis not present

## 2020-06-29 DIAGNOSIS — F411 Generalized anxiety disorder: Secondary | ICD-10-CM

## 2020-06-29 MED ORDER — SERTRALINE HCL 100 MG PO TABS: 150.0000 mg | ORAL_TABLET | Freq: Every day | ORAL | 0 refills | Status: DC

## 2020-06-29 MED ORDER — LAMOTRIGINE 150 MG PO TABS: 150.0000 mg | ORAL_TABLET | Freq: Every day | ORAL | 1 refills | Status: DC

## 2020-06-29 NOTE — Progress Notes (Signed)
Felicia Young [id number] 06-Nov-1990 30 y.o.    Subjective:   [name] is a 30 y.o. (DOB [birth year]) female.  Chief Complaint:  Chief Complaint  Patient presents with  . Follow-up    Mood anxiety and meds    Anxiety Symptoms include nervous/anxious behavior. Patient reports no confusion, decreased concentration, dizziness or suicidal ideas.     Felicia Young presents to the office today for follow-up of Bipolar 2, and GAD with obsessional elements.    Last seen Nov 2020. Sertraline was reduced from 150 to 100 to reduce sexual SE.  Tooo irritable after a month.  Better with 150 mg daily.  Irritability generally managed.  08/15/2019 appointment with the following noted: Pretty good overall.  Taking Lamotrigine AM and noted fuse shorter and switched it to night and then insomnia.  Then switched lamotrigine and sertraline to AM and sleep and irritability are better now.  Less vivid dream which would before shake her up emotionally for a few days.  Periods of being overwhelmed still. Some days feels in quicksand and hard to get out of it and unmotivated and low energy.  Once going is OK.  Sometimes numbness to emotion esp re intimacy  With husband.  Low libido but doesn't interfere with pleasure in sex.   Anxiety pretty good except bothered by mask in a store.  Mostly DT the breathing issues. Zoloft increased postpartum related to [name] birth she had flashbacks.   Especially with 2 small kids. 2 weeks gone back to gym   Less afternoon slump.  Better energy and motivation. Sleep 8 hours. Plan: Due to sexual side effects attempt transition from sertraline to Viibryd.  09/12/2019 appointment select appointment phone call patient complaining of some tingling and dizziness immediately after transition to Viibryd 20 mg daily also with some sweating.  Did some improvement in more normal emotionality.  Was felt to could potentially be some residual symptoms of serotonin  withdrawal and to take a low dose of sertraline for 5 days and then stop it again.  09/27/2019 appointment with the following noted: Dizziness and tingly resolved with switch to Viibryd 20 and extra sertraline. [name] is shorter than on Zoloft.  Quick to anger.  Positive however is feeling emotions otherwise more normally.  Tearful with happy things.  Less numbed.  More enjoyment and interest sexually but not a lot of motivation. More emotionally connected to husband. No mood swings. Feels more productive and energetic so far too. Plan: increasse Viibryd to 30 mg to help irritability  12/02/19 appt with the following noted: Since last appt had to do peer-peer appeal of denial of coverage for Viibryrd which was successful. Now on Viibryd 40 mg daily for about a month. Doing pretty good with it.  Feels pretty level and better than at lower dose with less anger and short-fused and less irritable.  Don't feel like a zombie or super numb but don't feel like she'll snap.  Better with numbness vs Zoloft.  Less sexual SE than Zoloft but kids interfere.   Not depressed.  Some anxiety.  Some worry over baby's breathing and will check repeatedly but usually can let go after a while. Plan no med changes  03/03/2020 appointment with the following noted: Called in Oct and wanted to swtich back to Zoloft bc irritability and lack of patience and weird dreams. Episode of sleep paralysis when took it at night.  Happened 3 times with 3rd time the worst. On sertraline  100 irritability and NM resolved.  H noticed Zoloft was better too.   Much more level.   Son disturbs sleep.   At first it felt more normal to be able to cry but that benefit was not worth the rest with Viibryd. Plan no med changes.  06/29/2020 appointment with the following noted: Depression after Xmas.   Struggles with [name] with ADHD and psych med trials.  Got into place where felt alone and bad about herself and wanted to sleep a lot.   Not  that dark in years.  A lot better now and happy now.  [name] doing a lot better and pt feels better now too.  Wonders about increasing Zoloft to 200 bc did so when down and it seemed to help.  Feels more productive and happy with 200 mg daily and less triggered with mood.  More productive. Taking Zoloft 100 mg daily now.  [name] was born via emergency C-section November 18, 2017 and swallowed fluid resulting in hypoxia and a NICU stay of 5 days.  Patient had some PTSD symptoms thereafter but those have largely resolved.  Past Psychiatric Medication Trials:  Lexapro, viibryd 40 poor response,  Zoloft 150 Depakote, Lamotrigine,                                                Increase the lamotrigine and sertraline to 150mg  each in January 2019.  Note: Mother also with a similar pattern of mood disorder, bipolar type II                    Review of Systems:  Review of Systems  Gastrointestinal: Negative for diarrhea.  Neurological: Negative for dizziness, tremors and weakness.  Psychiatric/Behavioral: Negative for agitation, behavioral problems, confusion, decreased concentration, dysphoric mood, hallucinations, self-injury, sleep disturbance and suicidal ideas. The patient is nervous/anxious. The patient is not hyperactive.     Medications: I have reviewed the patient's current medications.  Current Outpatient Medications  Medication Sig Dispense Refill  . albuterol (PROVENTIL HFA;VENTOLIN HFA) 108 (90 BASE) MCG/ACT inhaler Inhale 2 puffs into the lungs every 6 (six) hours as needed for wheezing. 1 Inhaler 0  . PREVIFEM 0.25-35 MG-MCG tablet     . lamoTRIgine (LAMICTAL) 150 MG tablet Take 1 tablet (150 mg total) by mouth daily. 90 tablet 1  . sertraline (ZOLOFT) 100 MG tablet Take 1.5 tablets (150 mg total) by mouth daily. 135 tablet 0   No current facility-administered medications for this visit.    Medication Side Effects: None, sexual SE  Allergies: No Known Allergies  Past Medical  History:  Diagnosis Date  . ALLERGIC RHINITIS    seasonal  . Depression   . Dyslipidemia 01/11/2013   CPX 01/2013 dx - Recommend diet/aerobic exercise changes with weight reduction  . Exercise-induced asthma   . Generalized anxiety disorder   . Migraine   . PCOS (polycystic ovarian syndrome)     Family History  Problem Relation Age of Onset  . Arthritis Mother   . Arthritis Father   . Alcohol abuse Maternal Grandmother   . Hyperlipidemia Maternal Grandmother   . Stroke Maternal Grandmother   . Hyperlipidemia Maternal Grandfather   . Hyperlipidemia Paternal Grandmother   . Hyperlipidemia Paternal Grandfather   . Hypertension Paternal Grandfather    Mother also has bipolar disorder type II plus anxiety Social  History   Socioeconomic History  . Marital status: Married    Spouse name: Not on file  . Number of children: Not on file  . Years of education: Not on file  . Highest education level: Not on file  Occupational History  . Not on file  Tobacco Use  . Smoking status: Never Smoker  . Smokeless tobacco: Never Used  Substance and Sexual Activity  . Alcohol use: No  . Drug use: No  . Sexual activity: Not on file  Other Topics Concern  . Not on file  Social History Narrative  . Not on file   Social Determinants of Health   Financial Resource Strain: Not on file  Food Insecurity: Not on file  Transportation Needs: Not on file  Physical Activity: Not on file  Stress: Not on file  Social Connections: Not on file  Intimate Partner Violence: Not on file    Past Medical History, Surgical history, Social history, and Family history were reviewed and updated as appropriate.   Please see review of systems for further details on the patient's review from today.   Objective:   Physical Exam:  There were no vitals taken for this visit.  Physical Exam Constitutional:      General: She is not in acute distress. Musculoskeletal:        General: No deformity.   Neurological:     Mental Status: She is alert and oriented to person, place, and time.     Cranial Nerves: No dysarthria.     Coordination: Coordination normal.  Psychiatric:        Attention and Perception: Attention and perception normal. She does not perceive auditory or visual hallucinations.        Mood and Affect: Mood is not anxious or depressed. Affect is not labile, blunt, angry or inappropriate.        Speech: Speech normal.        Behavior: Behavior normal. Behavior is cooperative.        Thought Content: Thought content normal. Thought content is not paranoid or delusional. Thought content does not include homicidal or suicidal ideation. Thought content does not include homicidal or suicidal plan.        Cognition and Memory: Cognition and memory normal.        Judgment: Judgment normal.     Comments: No manic symptoms.  Insight good.  She has less intrusive fearful thoughts than she was having last year  Feels more herself with Zoloft vs Viibryd with less anxiety but recent depression made her feel vulnerable..      Lab Review:     Component Value Date/Time   NA 138 01/21/2015 1051   K 4.4 01/21/2015 1051   CL 105 01/21/2015 1051   CO2 25 01/21/2015 1051   GLUCOSE 82 01/21/2015 1051   BUN 13 01/21/2015 1051   CREATININE 0.64 01/21/2015 1051   CALCIUM 9.6 01/21/2015 1051   PROT 7.6 01/21/2015 1051   ALBUMIN 4.1 01/21/2015 1051   AST 12 01/21/2015 1051   ALT 15 01/21/2015 1051   ALKPHOS 42 01/21/2015 1051   BILITOT 0.4 01/21/2015 1051   GFRNONAA >90 10/22/2013 2000   GFRAA >90 10/22/2013 2000       Component Value Date/Time   WBC 9.0 11/19/2016 0519   RBC 3.47 (L) 11/19/2016 0519   HGB 10.6 (L) 11/19/2016 0519   HCT 31.5 (L) 11/19/2016 0519   PLT 152 11/19/2016 0519   MCV 90.8 11/19/2016 0519  MCH 30.5 11/19/2016 0519   MCHC 33.7 11/19/2016 0519   RDW 14.5 11/19/2016 0519   LYMPHSABS 2.5 01/21/2015 1051   MONOABS 0.7 01/21/2015 1051   EOSABS 0.1  01/21/2015 1051   BASOSABS 0.0 01/21/2015 1051    No results found for: POCLITH, LITHIUM   No results found for: PHENYTOIN, PHENOBARB, VALPROATE, CBMZ   .res Assessment: Plan:    Bipolar II disorder (HCC) - Plan: lamoTRIgine (LAMICTAL) 150 MG tablet  Generalized anxiety disorder - Plan: sertraline (ZOLOFT) 100 MG tablet  PTSD (post-traumatic stress disorder) - Plan: sertraline (ZOLOFT) 100 MG tablet   We discussed symptoms are well controlled at this time.  Tolerating meds well.  Been relatively stable. No indication to change. Disc the risk.  Benefit from the increase made last year.  Less anxiety over the kids on Zoloft but would like to increase it.  .  Discussed her observation that lamotrigine at night tends to cause awakening.  She is sleeping better now that she switched it to the morning and also switching Zoloft to the morning reduced excessive vivid dreaming..   Counseled patient regarding potential benefits, risks, and side effects of Lamictal to include potential risk of Stevens-Johnson syndrome. Advised patient to stop taking Lamictal and contact office immediately if rash develops and to seek urgent medical attention if rash is severe and/or spreading quickly.  Supportive therapy re: dealing with D's emotional problems and son's developmental delays  Increase Zoloft to 150 mg  .  For reactivity and depression risk.  Disc risk mood cycling.  She and mother have done well with it.  Cont lamotrigine 150 mg daily  FU 3 mos  Meredith Staggers, MD, DFAPA   Please see After Visit Summary for patient specific instructions.  No future appointments.  No orders of the defined types were placed in this encounter.     -------------------------------

## 2020-08-23 ENCOUNTER — Other Ambulatory Visit: Payer: Self-pay | Admitting: Psychiatry

## 2020-08-23 DIAGNOSIS — F4310 Post-traumatic stress disorder, unspecified: Secondary | ICD-10-CM

## 2020-08-23 DIAGNOSIS — F411 Generalized anxiety disorder: Secondary | ICD-10-CM

## 2020-09-29 ENCOUNTER — Ambulatory Visit: Payer: 59 | Admitting: Psychiatry

## 2020-10-09 ENCOUNTER — Other Ambulatory Visit: Payer: Self-pay | Admitting: Psychiatry

## 2020-10-09 DIAGNOSIS — F4310 Post-traumatic stress disorder, unspecified: Secondary | ICD-10-CM

## 2020-10-09 DIAGNOSIS — F411 Generalized anxiety disorder: Secondary | ICD-10-CM

## 2020-12-17 ENCOUNTER — Ambulatory Visit: Payer: 59 | Admitting: Psychiatry

## 2021-01-02 ENCOUNTER — Other Ambulatory Visit: Payer: Self-pay | Admitting: Psychiatry

## 2021-01-02 DIAGNOSIS — F4310 Post-traumatic stress disorder, unspecified: Secondary | ICD-10-CM

## 2021-01-02 DIAGNOSIS — F411 Generalized anxiety disorder: Secondary | ICD-10-CM

## 2021-01-04 NOTE — Telephone Encounter (Signed)
Please schedule appt

## 2021-01-05 NOTE — Telephone Encounter (Signed)
Pt has an appt 12/12

## 2021-03-15 ENCOUNTER — Encounter: Payer: Self-pay | Admitting: Psychiatry

## 2021-03-15 ENCOUNTER — Ambulatory Visit (INDEPENDENT_AMBULATORY_CARE_PROVIDER_SITE_OTHER): Payer: 59 | Admitting: Psychiatry

## 2021-03-15 ENCOUNTER — Other Ambulatory Visit: Payer: Self-pay

## 2021-03-15 DIAGNOSIS — F4310 Post-traumatic stress disorder, unspecified: Secondary | ICD-10-CM | POA: Diagnosis not present

## 2021-03-15 DIAGNOSIS — F411 Generalized anxiety disorder: Secondary | ICD-10-CM

## 2021-03-15 DIAGNOSIS — F3181 Bipolar II disorder: Secondary | ICD-10-CM | POA: Diagnosis not present

## 2021-03-15 MED ORDER — ARIPIPRAZOLE 5 MG PO TABS: 5.0000 mg | ORAL_TABLET | Freq: Every day | ORAL | 1 refills | Status: DC

## 2021-03-15 NOTE — Progress Notes (Signed)
Felicia Young [id number] 07-18-1990 30 y.o.    Subjective:   [name] is a 30 y.o. (DOB Sep 26, 1990) female.  Chief Complaint:  Chief Complaint  Patient presents with   Follow-up    Bipolar II disorder (HCC)    Anxiety Patient reports no confusion, decreased concentration, dizziness, nervous/anxious behavior or suicidal ideas.    Felicia Young presents to the office today for follow-up of Bipolar 2, and GAD with obsessional elements.    seen Nov 2020. Sertraline was reduced from 150 to 100 to reduce sexual SE.  Tooo irritable after a month.  Better with 150 mg daily.  Irritability generally managed.  08/15/2019 appointment with the following noted: Pretty good overall.  Taking Lamotrigine AM and noted fuse shorter and switched it to night and then insomnia.  Then switched lamotrigine and sertraline to AM and sleep and irritability are better now.  Less vivid dream which would before shake her up emotionally for a few days.  Periods of being overwhelmed still. Some days feels in quicksand and hard to get out of it and unmotivated and low energy.  Once going is OK.  Sometimes numbness to emotion esp re intimacy  With husband.  Low libido but doesn't interfere with pleasure in sex.   Anxiety pretty good except bothered by mask in a store.  Mostly DT the breathing issues. Zoloft increased postpartum related to Felicia Young's birth she had flashbacks.   Especially with 2 small kids. 2 weeks gone back to gym   Less afternoon slump.  Better energy and motivation. Sleep 8 hours. Plan: Due to sexual side effects attempt transition from sertraline to Viibryd.  09/12/2019 appointment select appointment phone call patient complaining of some tingling and dizziness immediately after transition to Viibryd 20 mg daily also with some sweating.  Did some improvement in more normal emotionality.  Was felt to could potentially be some residual symptoms of serotonin withdrawal and to take a  low dose of sertraline for 5 days and then stop it again.  09/27/2019 appointment with the following noted: Dizziness and tingly resolved with switch to Viibryd 20 and extra sertraline. [name] is shorter than on Zoloft.  Quick to anger.  Positive however is feeling emotions otherwise more normally.  Tearful with happy things.  Less numbed.  More enjoyment and interest sexually but not a lot of motivation. More emotionally connected to husband. No mood swings. Feels more productive and energetic so far too. Plan: increasse Viibryd to 30 mg to help irritability  12/02/19 appt with the following noted: Since last appt had to do peer-peer appeal of denial of coverage for Viibryrd which was successful. Now on Viibryd 40 mg daily for about a month. Doing pretty good with it.  Feels pretty level and better than at lower dose with less anger and short-fused and less irritable.  Don't feel like a zombie or super numb but don't feel like she'll snap.  Better with numbness vs Zoloft.  Less sexual SE than Zoloft but kids interfere.   Not depressed.  Some anxiety.  Some worry over baby's breathing and will check repeatedly but usually can let go after a while. Plan no med changes  03/03/2020 appointment with the following noted: Called in Oct and wanted to swtich back to Zoloft bc irritability and lack of patience and weird dreams. Episode of sleep paralysis when took it at night.  Happened 3 times with 3rd time the worst. On sertraline 100 irritability and NM  resolved.  H noticed Zoloft was better too.   Much more level.   Son disturbs sleep.   At first it felt more normal to be able to cry but that benefit was not worth the rest with Viibryd. Plan no med changes.  06/29/2020 appointment with the following noted: Depression after Xmas.   Struggles with Felicia Young with ADHD and psych med trials.  Got into place where felt alone and bad about herself and wanted to sleep a lot.   Not that dark in years.  A lot  better now and happy now.  Felicia Young doing a lot better and pt feels better now too.  Wonders about increasing Zoloft to 200 bc did so when down and it seemed to help.  Feels more productive and happy with 200 mg daily and less triggered with mood.  More productive. Taking Zoloft 100 mg daily now. Plan: Increase Zoloft to 150 mg  .  For reactivity and depression risk.   Cont lamotrigine 150 mg daily  03/15/21 appt noted: On sertraline 150 mg since here.   Hard time just go through motions.  Hard to enjoy. Tired a lot.  Depression.   Low motivation and productivity.  Still some irritabilty.  Sleep is normal unless son gets her up. Anxiety less of a problem.  Felicia Young was born via emergency C-section November 18, 2017 and swallowed fluid resulting in hypoxia and a NICU stay of 5 days.  Patient had some PTSD symptoms thereafter but those have largely resolved.  Past Psychiatric Medication Trials:  Lexapro, viibryd 40 poor response,  Zoloft 150 Depakote, Lamotrigine,                                                Increase the lamotrigine and sertraline to 150mg  each in January 2019.  Note: Mother also with a similar pattern of mood disorder, bipolar type II.  M and B on Cymbalta.                    Review of Systems:  Review of Systems  Gastrointestinal:  Negative for diarrhea.  Neurological:  Negative for dizziness, tremors and weakness.  Psychiatric/Behavioral:  Positive for dysphoric mood. Negative for agitation, behavioral problems, confusion, decreased concentration, hallucinations, self-injury, sleep disturbance and suicidal ideas. The patient is not nervous/anxious and is not hyperactive.    Medications: I have reviewed the patient's current medications.  Current Outpatient Medications  Medication Sig Dispense Refill   albuterol (PROVENTIL HFA;VENTOLIN HFA) 108 (90 BASE) MCG/ACT inhaler Inhale 2 puffs into the lungs every 6 (six) hours as needed for wheezing. 1 Inhaler 0   lamoTRIgine  (LAMICTAL) 150 MG tablet Take 1 tablet (150 mg total) by mouth daily. 90 tablet 1   sertraline (ZOLOFT) 100 MG tablet TAKE 1.5 TABLETS (150MG  TOTAL) BY MOUTH DAILY 135 tablet 0   ARIPiprazole (ABILIFY) 5 MG tablet Take 1 tablet (5 mg total) by mouth daily. 30 tablet 1   No current facility-administered medications for this visit.    Medication Side Effects: None, sexual SE  Allergies: No Known Allergies  Past Medical History:  Diagnosis Date   ALLERGIC RHINITIS    seasonal   Depression    Dyslipidemia 01/11/2013   CPX 01/2013 dx - Recommend diet/aerobic exercise changes with weight reduction   Exercise-induced asthma    Generalized anxiety disorder  Migraine    PCOS (polycystic ovarian syndrome)     Family History  Problem Relation Age of Onset   Arthritis Mother    Arthritis Father    Alcohol abuse Maternal Grandmother    Hyperlipidemia Maternal Grandmother    Stroke Maternal Grandmother    Hyperlipidemia Maternal Grandfather    Hyperlipidemia Paternal Grandmother    Hyperlipidemia Paternal Grandfather    Hypertension Paternal Grandfather    Mother also has bipolar disorder type II plus anxiety Social History   Socioeconomic History   Marital status: Married    Spouse name: Not on file   Number of children: Not on file   Years of education: Not on file   Highest education level: Not on file  Occupational History   Not on file  Tobacco Use   Smoking status: Never   Smokeless tobacco: Never  Substance and Sexual Activity   Alcohol use: No   Drug use: No   Sexual activity: Not on file  Other Topics Concern   Not on file  Social History Narrative   Not on file   Social Determinants of Health   Financial Resource Strain: Not on file  Food Insecurity: Not on file  Transportation Needs: Not on file  Physical Activity: Not on file  Stress: Not on file  Social Connections: Not on file  Intimate Partner Violence: Not on file    Past Medical History,  Surgical history, Social history, and Family history were reviewed and updated as appropriate.   Please see review of systems for further details on the patient's review from today.   Objective:   Physical Exam:  There were no vitals taken for this visit.  Physical Exam Constitutional:      General: She is not in acute distress. Musculoskeletal:        General: No deformity.  Neurological:     Mental Status: She is alert and oriented to person, place, and time.     Cranial Nerves: No dysarthria.     Coordination: Coordination normal.  Psychiatric:        Attention and Perception: Attention and perception normal. She does not perceive auditory or visual hallucinations.        Mood and Affect: Mood is depressed. Mood is not anxious. Affect is not labile, blunt, angry or inappropriate.        Speech: Speech normal.        Behavior: Behavior normal. Behavior is cooperative.        Thought Content: Thought content normal. Thought content is not paranoid or delusional. Thought content does not include homicidal or suicidal ideation. Thought content does not include homicidal or suicidal plan.        Cognition and Memory: Cognition and memory normal.        Judgment: Judgment normal.     Comments: No manic symptoms.  Insight good.   More depression and anhedonia.     Lab Review:     Component Value Date/Time   NA 138 01/21/2015 1051   K 4.4 01/21/2015 1051   CL 105 01/21/2015 1051   CO2 25 01/21/2015 1051   GLUCOSE 82 01/21/2015 1051   BUN 13 01/21/2015 1051   CREATININE 0.64 01/21/2015 1051   CALCIUM 9.6 01/21/2015 1051   PROT 7.6 01/21/2015 1051   ALBUMIN 4.1 01/21/2015 1051   AST 12 01/21/2015 1051   ALT 15 01/21/2015 1051   ALKPHOS 42 01/21/2015 1051   BILITOT 0.4 01/21/2015 1051   GFRNONAA >  90 10/22/2013 2000   GFRAA >90 10/22/2013 2000       Component Value Date/Time   WBC 9.0 11/19/2016 0519   RBC 3.47 (L) 11/19/2016 0519   HGB 10.6 (L) 11/19/2016 0519   HCT  31.5 (L) 11/19/2016 0519   PLT 152 11/19/2016 0519   MCV 90.8 11/19/2016 0519   MCH 30.5 11/19/2016 0519   MCHC 33.7 11/19/2016 0519   RDW 14.5 11/19/2016 0519   LYMPHSABS 2.5 01/21/2015 1051   MONOABS 0.7 01/21/2015 1051   EOSABS 0.1 01/21/2015 1051   BASOSABS 0.0 01/21/2015 1051    No results found for: POCLITH, LITHIUM   No results found for: PHENYTOIN, PHENOBARB, VALPROATE, CBMZ   .res Assessment: Plan:    Bipolar II disorder (HCC) - Plan: ARIPiprazole (ABILIFY) 5 MG tablet  Generalized anxiety disorder  PTSD (post-traumatic stress disorder)   We discussed symptoms are well controlled at this time.  Tolerating meds well.  Been relatively stable. No indication to change. Disc the risk.  Benefit from the increase made last year.  Less anxiety over the kids on Zoloft but would like to increase it.  .  Discussed her observation that lamotrigine at night tends to cause awakening.  She is sleeping better now that she switched it to the morning and also switching Zoloft to the morning reduced excessive vivid dreaming..   Counseled patient regarding potential benefits, risks, and side effects of Lamictal to include potential risk of Stevens-Johnson syndrome. Advised patient to stop taking Lamictal and contact office immediately if rash develops and to seek urgent medical attention if rash is severe and/or spreading quickly.  Supportive therapy re: dealing with D's emotional problems and son's developmental delays  No better with Increase Zoloft to 150 mg  .  For reactivity and depression risk.  Disc risk mood cycling.  She and mother have done well with it.  Abilify faster vs duloxetine switch.  Abilify potentiation. 2.5  up to 5 in a few days if needed. Discussed potential metabolic side effects associated with atypical antipsychotics, as well as potential risk for movement side effects. Advised pt to contact office if movement side effects occur.   Cont lamotrigine 150 mg  daily  FU 1 mos  Meredith Staggers, MD, DFAPA   Please see After Visit Summary for patient specific instructions.  No future appointments.  No orders of the defined types were placed in this encounter.     -------------------------------

## 2021-04-06 ENCOUNTER — Other Ambulatory Visit: Payer: Self-pay | Admitting: Psychiatry

## 2021-04-06 DIAGNOSIS — F3181 Bipolar II disorder: Secondary | ICD-10-CM

## 2021-04-07 ENCOUNTER — Other Ambulatory Visit: Payer: Self-pay | Admitting: Psychiatry

## 2021-04-07 DIAGNOSIS — F411 Generalized anxiety disorder: Secondary | ICD-10-CM

## 2021-04-07 DIAGNOSIS — F4310 Post-traumatic stress disorder, unspecified: Secondary | ICD-10-CM

## 2021-05-04 ENCOUNTER — Ambulatory Visit: Payer: 59 | Admitting: Psychiatry

## 2021-05-06 ENCOUNTER — Other Ambulatory Visit: Payer: Self-pay | Admitting: Psychiatry

## 2021-05-06 DIAGNOSIS — F3181 Bipolar II disorder: Secondary | ICD-10-CM

## 2021-05-11 ENCOUNTER — Other Ambulatory Visit: Payer: Self-pay

## 2021-05-11 ENCOUNTER — Encounter: Payer: Self-pay | Admitting: Psychiatry

## 2021-05-11 ENCOUNTER — Ambulatory Visit (INDEPENDENT_AMBULATORY_CARE_PROVIDER_SITE_OTHER): Payer: 59 | Admitting: Psychiatry

## 2021-05-11 DIAGNOSIS — T887XXA Unspecified adverse effect of drug or medicament, initial encounter: Secondary | ICD-10-CM

## 2021-05-11 DIAGNOSIS — F411 Generalized anxiety disorder: Secondary | ICD-10-CM

## 2021-05-11 DIAGNOSIS — F4310 Post-traumatic stress disorder, unspecified: Secondary | ICD-10-CM | POA: Diagnosis not present

## 2021-05-11 DIAGNOSIS — F3181 Bipolar II disorder: Secondary | ICD-10-CM | POA: Diagnosis not present

## 2021-05-11 MED ORDER — SERTRALINE HCL 100 MG PO TABS: 150.0000 mg | ORAL_TABLET | Freq: Every day | ORAL | 1 refills | Status: DC

## 2021-05-11 MED ORDER — LAMOTRIGINE 150 MG PO TABS: 150.0000 mg | ORAL_TABLET | Freq: Every day | ORAL | 1 refills | Status: DC

## 2021-05-11 MED ORDER — ARIPIPRAZOLE 5 MG PO TABS: 5.0000 mg | ORAL_TABLET | Freq: Every day | ORAL | 0 refills | Status: DC

## 2021-05-11 NOTE — Progress Notes (Signed)
Felicia Young [id number] 06-01-1990 31 y.o.    Subjective:   [name] is a 31 y.o. (DOB March 18, 1991) female.  Chief Complaint:  Chief Complaint  Patient presents with   Follow-up    Bipolar II disorder (HCC)   Depression    Anxiety Patient reports no confusion, decreased concentration, dizziness, nervous/anxious behavior or suicidal ideas.    Felicia Young presents to the office today for follow-up of Bipolar 2, and GAD with obsessional elements.    seen Nov 2020. Sertraline was reduced from 150 to 100 to reduce sexual SE.  Tooo irritable after a month.  Better with 150 mg daily.  Irritability generally managed.  08/15/2019 appointment with the following noted: Pretty good overall.  Taking Lamotrigine AM and noted fuse shorter and switched it to night and then insomnia.  Then switched lamotrigine and sertraline to AM and sleep and irritability are better now.  Less vivid dream which would before shake her up emotionally for a few days.  Periods of being overwhelmed still. Some days feels in quicksand and hard to get out of it and unmotivated and low energy.  Once going is OK.  Sometimes numbness to emotion esp re intimacy  With husband.  Low libido but doesn't interfere with pleasure in sex.   Anxiety pretty good except bothered by mask in a store.  Mostly DT the breathing issues. Zoloft increased postpartum related to Felicia Young's birth she had flashbacks.   Especially with 2 small kids. 2 weeks gone back to gym   Less afternoon slump.  Better energy and motivation. Sleep 8 hours. Plan: Due to sexual side effects attempt transition from sertraline to Viibryd.  09/12/2019 appointment select appointment phone call patient complaining of some tingling and dizziness immediately after transition to Viibryd 20 mg daily also with some sweating.  Did some improvement in more normal emotionality.  Was felt to could potentially be some residual symptoms of serotonin withdrawal  and to take a low dose of sertraline for 5 days and then stop it again.  09/27/2019 appointment with the following noted: Dizziness and tingly resolved with switch to Viibryd 20 and extra sertraline. [name] is shorter than on Zoloft.  Quick to anger.  Positive however is feeling emotions otherwise more normally.  Tearful with happy things.  Less numbed.  More enjoyment and interest sexually but not a lot of motivation. More emotionally connected to husband. No mood swings. Feels more productive and energetic so far too. Plan: increasse Viibryd to 30 mg to help irritability  12/02/19 appt with the following noted: Since last appt had to do peer-peer appeal of denial of coverage for Viibryrd which was successful. Now on Viibryd 40 mg daily for about a month. Doing pretty good with it.  Feels pretty level and better than at lower dose with less anger and short-fused and less irritable.  Don't feel like a zombie or super numb but don't feel like she'll snap.  Better with numbness vs Zoloft.  Less sexual SE than Zoloft but kids interfere.   Not depressed.  Some anxiety.  Some worry over baby's breathing and will check repeatedly but usually can let go after a while. Plan no med changes  03/03/2020 appointment with the following noted: Called in Oct and wanted to swtich back to Zoloft bc irritability and lack of patience and weird dreams. Episode of sleep paralysis when took it at night.  Happened 3 times with 3rd time the worst. On sertraline 100  irritability and NM resolved.  H noticed Zoloft was better too.   Much more level.   Son disturbs sleep.   At first it felt more normal to be able to cry but that benefit was not worth the rest with Viibryd. Plan no med changes.  06/29/2020 appointment with the following noted: Depression after Xmas.   Struggles with Felicia Young with ADHD and psych med trials.  Got into place where felt alone and bad about herself and wanted to sleep a lot.   Not that dark in  years.  A lot better now and happy now.  Felicia Young doing a lot better and pt feels better now too.  Wonders about increasing Zoloft to 200 bc did so when down and it seemed to help.  Feels more productive and happy with 200 mg daily and less triggered with mood.  More productive. Taking Zoloft 100 mg daily now. Plan: Increase Zoloft to 150 mg  .  For reactivity and depression risk.   Cont lamotrigine 150 mg daily  03/15/21 appt noted: On sertraline 150 mg since here.   Hard time just go through motions.  Hard to enjoy. Tired a lot.  Depression.   Low motivation and productivity.  Still some irritabilty.  Sleep is normal unless son gets her up. Anxiety less of a problem. Plan: Abilify faster vs duloxetine switch.  Abilify potentiation. 2.5  up to 5 in a few days if needed.  05/11/2021 appointment with the following noted: Abilify was the missing link and mother noticed benefit in a few days.   No longer feels like a shell and more like a person.  Better energy.  Better function.  Less guilt and negativity. No SE [name] travels rough when he's gone. D ADHD and suspected autism.  Pending evaluation.  Has OT.  Difficult. Felicia Young is 6 but has good memory. Patient reports stable mood and denies depressed or irritable moods.  Patient denies any recent difficulty with anxiety.  Patient denies difficulty with sleep initiation or maintenance. Denies appetite disturbance.  Patient reports that energy and motivation have been good.  Patient denies any difficulty with concentration.  Patient denies any suicidal ideation.  Felicia Young was born via emergency C-section November 18, 2017 and swallowed fluid resulting in hypoxia and a NICU stay of 5 days.  Patient had some PTSD symptoms thereafter but those have largely resolved.  Past Psychiatric Medication Trials:  Lexapro, viibryd 40 poor response,  Zoloft 150 Depakote, Lamotrigine,                                                Increase the lamotrigine and sertraline  to 150mg  each in January 2019.  Note: Mother also with a similar pattern of mood disorder, bipolar type II.  M and B on Cymbalta.                    Review of Systems:  Review of Systems  Constitutional:  Negative for fatigue.  Gastrointestinal:  Negative for diarrhea.  Neurological:  Negative for dizziness, tremors and weakness.  Psychiatric/Behavioral:  Negative for agitation, behavioral problems, confusion, decreased concentration, dysphoric mood, hallucinations, self-injury, sleep disturbance and suicidal ideas. The patient is not nervous/anxious and is not hyperactive.    Medications: I have reviewed the patient's current medications.  Current Outpatient Medications  Medication Sig Dispense Refill  albuterol (PROVENTIL HFA;VENTOLIN HFA) 108 (90 BASE) MCG/ACT inhaler Inhale 2 puffs into the lungs every 6 (six) hours as needed for wheezing. 1 Inhaler 0   ARIPiprazole (ABILIFY) 5 MG tablet Take 1 tablet (5 mg total) by mouth daily. 90 tablet 0   lamoTRIgine (LAMICTAL) 150 MG tablet Take 1 tablet (150 mg total) by mouth daily. 90 tablet 1   sertraline (ZOLOFT) 100 MG tablet Take 1.5 tablets (150 mg total) by mouth daily. 135 tablet 1   No current facility-administered medications for this visit.    Medication Side Effects: None, sexual SE  Allergies: No Known Allergies  Past Medical History:  Diagnosis Date   ALLERGIC RHINITIS    seasonal   Depression    Dyslipidemia 01/11/2013   CPX 01/2013 dx - Recommend diet/aerobic exercise changes with weight reduction   Exercise-induced asthma    Generalized anxiety disorder    Migraine    PCOS (polycystic ovarian syndrome)     Family History  Problem Relation Age of Onset   Arthritis Mother    Arthritis Father    Alcohol abuse Maternal Grandmother    Hyperlipidemia Maternal Grandmother    Stroke Maternal Grandmother    Hyperlipidemia Maternal Grandfather    Hyperlipidemia Paternal Grandmother    Hyperlipidemia Paternal  Grandfather    Hypertension Paternal Grandfather    Mother also has bipolar disorder type II plus anxiety Social History   Socioeconomic History   Marital status: Married    Spouse name: Not on file   Number of children: Not on file   Years of education: Not on file   Highest education level: Not on file  Occupational History   Not on file  Tobacco Use   Smoking status: Never   Smokeless tobacco: Never  Substance and Sexual Activity   Alcohol use: No   Drug use: No   Sexual activity: Not on file  Other Topics Concern   Not on file  Social History Narrative   Not on file   Social Determinants of Health   Financial Resource Strain: Not on file  Food Insecurity: Not on file  Transportation Needs: Not on file  Physical Activity: Not on file  Stress: Not on file  Social Connections: Not on file  Intimate Partner Violence: Not on file    Past Medical History, Surgical history, Social history, and Family history were reviewed and updated as appropriate.   Please see review of systems for further details on the patient's review from today.   Objective:   Physical Exam:  There were no vitals taken for this visit.  Physical Exam Constitutional:      General: She is not in acute distress. Musculoskeletal:        General: No deformity.  Neurological:     Mental Status: She is alert and oriented to person, place, and time.     Cranial Nerves: No dysarthria.     Coordination: Coordination normal.  Psychiatric:        Attention and Perception: Attention and perception normal. She does not perceive auditory or visual hallucinations.        Mood and Affect: Mood is not anxious or depressed. Affect is not labile, blunt, angry or inappropriate.        Speech: Speech normal.        Behavior: Behavior normal. Behavior is cooperative.        Thought Content: Thought content normal. Thought content is not paranoid or delusional. Thought content does not include  homicidal or  suicidal ideation. Thought content does not include suicidal plan.        Cognition and Memory: Cognition and memory normal.        Judgment: Judgment normal.     Comments: No manic symptoms.  Insight good.   Resolved depression and anhedonia.     Lab Review:     Component Value Date/Time   NA 138 01/21/2015 1051   K 4.4 01/21/2015 1051   CL 105 01/21/2015 1051   CO2 25 01/21/2015 1051   GLUCOSE 82 01/21/2015 1051   BUN 13 01/21/2015 1051   CREATININE 0.64 01/21/2015 1051   CALCIUM 9.6 01/21/2015 1051   PROT 7.6 01/21/2015 1051   ALBUMIN 4.1 01/21/2015 1051   AST 12 01/21/2015 1051   ALT 15 01/21/2015 1051   ALKPHOS 42 01/21/2015 1051   BILITOT 0.4 01/21/2015 1051   GFRNONAA >90 10/22/2013 2000   GFRAA >90 10/22/2013 2000       Component Value Date/Time   WBC 9.0 11/19/2016 0519   RBC 3.47 (L) 11/19/2016 0519   HGB 10.6 (L) 11/19/2016 0519   HCT 31.5 (L) 11/19/2016 0519   PLT 152 11/19/2016 0519   MCV 90.8 11/19/2016 0519   MCH 30.5 11/19/2016 0519   MCHC 33.7 11/19/2016 0519   RDW 14.5 11/19/2016 0519   LYMPHSABS 2.5 01/21/2015 1051   MONOABS 0.7 01/21/2015 1051   EOSABS 0.1 01/21/2015 1051   BASOSABS 0.0 01/21/2015 1051    No results found for: POCLITH, LITHIUM   No results found for: PHENYTOIN, PHENOBARB, VALPROATE, CBMZ   .res Assessment: Plan:    Bipolar II disorder (HCC) - Plan: ARIPiprazole (ABILIFY) 5 MG tablet, lamoTRIgine (LAMICTAL) 150 MG tablet  Generalized anxiety disorder - Plan: sertraline (ZOLOFT) 100 MG tablet  PTSD (post-traumatic stress disorder) - Plan: sertraline (ZOLOFT) 100 MG tablet  Side effect of medication   Greater than 50% of 30 min face to face time with patient was spent on counseling and coordination of care. We discussed symptoms are well controlled at this time.  Tolerating meds well.   Disc the risk.    Was depressed at the visit in December but symptoms were resolved with the addition of Abilify 2.5 mg  daily  Counseled patient regarding potential benefits, risks, and side effects of Lamictal to include potential risk of Stevens-Johnson syndrome. Advised patient to stop taking Lamictal and contact office immediately if rash develops and to seek urgent medical attention if rash is severe and/or spreading quickly.  Supportive therapy re: dealing with D's emotional problems and son's developmental delays  No better with Increase Zoloft to 150 mg  .  For reactivity and depression risk.  Disc risk mood cycling.  She and mother have done well with it.  Abilify potentiation. 2.5 effective and tolerated.   Discussed potential metabolic side effects associated with atypical antipsychotics, as well as potential risk for movement side effects. Advised pt to contact office if movement side effects occur.   Cont lamotrigine 150 mg daily No med changes.  FU 3 mos  Meredith Staggers, MD, DFAPA   Please see After Visit Summary for patient specific instructions.  No future appointments.  No orders of the defined types were placed in this encounter.      -------------------------------

## 2021-08-09 ENCOUNTER — Ambulatory Visit (INDEPENDENT_AMBULATORY_CARE_PROVIDER_SITE_OTHER): Payer: 59 | Admitting: Psychiatry

## 2021-08-09 ENCOUNTER — Encounter: Payer: Self-pay | Admitting: Psychiatry

## 2021-08-09 DIAGNOSIS — F902 Attention-deficit hyperactivity disorder, combined type: Secondary | ICD-10-CM

## 2021-08-09 DIAGNOSIS — F3181 Bipolar II disorder: Secondary | ICD-10-CM | POA: Diagnosis not present

## 2021-08-09 DIAGNOSIS — F4310 Post-traumatic stress disorder, unspecified: Secondary | ICD-10-CM

## 2021-08-09 DIAGNOSIS — F411 Generalized anxiety disorder: Secondary | ICD-10-CM

## 2021-08-09 MED ORDER — LISDEXAMFETAMINE DIMESYLATE 30 MG PO CAPS: 30.0000 mg | ORAL_CAPSULE | Freq: Every day | ORAL | 0 refills | Status: DC

## 2021-08-09 NOTE — Progress Notes (Addendum)
Felicia Young ?[id number] ?[birth year] ?31 y.o.  ? ? ?Subjective:  ? ?[name] is a 31 y.o. (DOB October 21, 1990) female. ? ?Chief Complaint:  ?Chief Complaint  ?Patient presents with  ? Follow-up  ?  Mood and anxiety  ? ? ?Anxiety ?Symptoms include decreased concentration. Patient reports no confusion, dizziness, nervous/anxious behavior or suicidal ideas.  ? ? ?Felicia Young presents to the office today for follow-up of Bipolar 2, and GAD with obsessional elements.   ? ?seen Nov 2020. Sertraline was reduced from 150 to 100 to reduce sexual SE.  Tooo irritable after a month.  Better with 150 mg daily.  Irritability generally managed. ? ?08/15/2019 appointment with the following noted: ?Pretty good overall.  Taking Lamotrigine AM and noted fuse shorter and switched it to night and then insomnia.  Then switched lamotrigine and sertraline to AM and sleep and irritability are better now.  Less vivid dream which would before shake her up emotionally for a few days.  Periods of being overwhelmed still. ?Some days feels in quicksand and hard to get out of it and unmotivated and low energy.  Once going is OK.  Sometimes numbness to emotion esp re intimacy  With husband.  Low libido but doesn't interfere with pleasure in sex.   ?Anxiety pretty good except bothered by mask in a store.  Mostly DT the breathing issues. ?Zoloft increased postpartum related to Felicia Young's birth she had flashbacks.   ?Especially with 2 small kids. 2 weeks gone back to gym ?  Less afternoon slump.  Better energy and motivation. Sleep 8 hours. ?Plan: Due to sexual side effects attempt transition from sertraline to [place]. ? ?09/12/2019 appointment select appointment phone call patient complaining of some tingling and dizziness immediately after transition to Viibryd 20 mg daily also with some sweating.  Did some improvement in more normal emotionality.  Was felt to could potentially be some residual symptoms of serotonin withdrawal and to  take a low dose of sertraline for 5 days and then stop it again. ? ?09/27/2019 appointment with the following noted: ?Dizziness and tingly resolved with switch to Viibryd 20 and extra sertraline. ?Melene Plan is shorter than on Zoloft.  Quick to anger.  Positive however is feeling emotions otherwise more normally.  Tearful with happy things.  Less numbed.  ?More enjoyment and interest sexually but not a lot of motivation. ?More emotionally connected to husband. ?No mood swings. ?Feels more productive and energetic so far too. ?Plan: increasse Viibryd to 30 mg to help irritability ? ?12/02/19 appt with the following noted: ?Since last appt had to do peer-peer appeal of denial of coverage for Viibryrd which was successful. ?Now on Viibryd 40 mg daily for about a month. ?Doing pretty good with it.  Feels pretty level and better than at lower dose with less anger and short-fused and less irritable.  Don't feel like a zombie or super numb but don't feel like she'll snap.  Better with numbness vs Zoloft.  Less sexual SE than Zoloft but kids interfere.   ?Not depressed.  Some anxiety.  Some worry over baby's breathing and will check repeatedly but usually can let go after a while. ?Plan no med changes ? ?03/03/2020 appointment with the following noted: ?Called in Oct and wanted to swtich back to Zoloft bc irritability and lack of patience and weird dreams. Episode of sleep paralysis when took it at night.  Happened 3 times with 3rd time the worst. ?On sertraline 100 irritability and  NM resolved.  H noticed Zoloft was better too.   ?Much more level.   ?Son disturbs sleep.   ?At first it felt more normal to be able to cry but that benefit was not worth the rest with Viibryd. ?Plan no med changes. ? ?06/29/2020 appointment with the following noted: ?Depression after Xmas.   ?Struggles with Felicia Young with ADHD and psych med trials.  Got into place where felt alone and bad about herself and wanted to sleep a lot.   Not that dark in years.   A lot better now and happy now.  Felicia Young doing a lot better and pt feels better now too.  Wonders about increasing Zoloft to 200 bc did so when down and it seemed to help.  Feels more productive and happy with 200 mg daily and less triggered with mood.  More productive. ?Taking Zoloft 100 mg daily now. ?Plan: Increase Zoloft to 150 mg  .  For reactivity and depression risk.   ?Cont lamotrigine 150 mg daily ? ?03/15/21 appt noted: ?On sertraline 150 mg since here.   ?Hard time just go through motions.  Hard to enjoy. Tired a lot.  Depression.   Low motivation and productivity.  Still some irritabilty.  Sleep is normal unless son gets her up. ?Anxiety less of a problem. ?Plan: Abilify faster vs duloxetine switch.  Abilify potentiation. 2.5  up to 5 in a few days if needed. ? ?05/11/2021 appointment with the following noted: ?Abilify was the missing link and mother noticed benefit in a few days.   ?No longer feels like a shell and more like a person.  Better energy.  Better function.  Less guilt and negativity. ?No SE ?H Felicia Young travels rough when he's gone. ?D ADHD and suspected autism.  Pending evaluation.  Has OT.  Difficult. ?Felicia Young is 6 but has good memory. ?Patient reports stable mood and denies depressed or irritable moods.  Patient denies any recent difficulty with anxiety.  Patient denies difficulty with sleep initiation or maintenance. Denies appetite disturbance.  Patient reports that energy and motivation have been good.  Patient denies any difficulty with concentration.  Patient denies any suicidal ideation. ?Plan: No med changes.  Continue lamotrigine 150, Abilify 2.5, sertraline 150 mg daily ? ?08/09/2021 appointment with the following noted: ?Doing pretty good.  No sig depression episodes.   ?Asks about ADHD.  Find it hard to get stuff done.  Task paralysis.  Initiative problems.  D has ADHD.  Has trouble getting things finished for example painting rooms.  Can get overstimulated with sound.  Overwhelmed with  tasks and finishing them. Not losing things.  Getting things organized is difficult.   ?Driving is OK bc fear accidents.  Can interrupt in conversations bc sense of urgency.   ?Had trouble with timed and other tests and would freeze up with getting overwhelmed. ?Fidgety.  Hard to sit and watch a movie without doing something else with her hands. ?Sleep 8 hours. ?B started Vyvanse with benefit.  H doesn't have ADHD. ? ?Felicia Young was born via emergency C-section November 18, 2017 and swallowed fluid resulting in hypoxia and a NICU stay of 5 days.  Patient had some PTSD symptoms thereafter but those have largely resolved. ? ?Past Psychiatric Medication Trials:  Lexapro, viibryd 40 poor response,  Zoloft 150 ?Depakote, Lamotrigine,                                                ?  Increase the lamotrigine and sertraline to 150mg  each in January 2019. ? ?Note: Mother also with a similar pattern of mood disorder, bipolar type II.  M and B on Cymbalta. ?                   ?Review of Systems:  ?Review of Systems  ?Constitutional:  Negative for fatigue.  ?Gastrointestinal:  Negative for diarrhea.  ?Neurological:  Negative for dizziness, tremors and weakness.  ?Psychiatric/Behavioral:  Positive for decreased concentration. Negative for agitation, behavioral problems, confusion, dysphoric mood, hallucinations, self-injury, sleep disturbance and suicidal ideas. The patient is not nervous/anxious and is not hyperactive.   ? ?Medications: I have reviewed the patient's current medications. ? ?Current Outpatient Medications  ?Medication Sig Dispense Refill  ? albuterol (PROVENTIL HFA;VENTOLIN HFA) 108 (90 BASE) MCG/ACT inhaler Inhale 2 puffs into the lungs every 6 (six) hours as needed for wheezing. 1 Inhaler 0  ? ARIPiprazole (ABILIFY) 5 MG tablet Take 1 tablet (5 mg total) by mouth daily. (Patient taking differently: Take 2.5 mg by mouth daily.) 90 tablet 0  ? lamoTRIgine (LAMICTAL) 150 MG tablet Take 1 tablet (150 mg total) by mouth  daily. 90 tablet 1  ? lisdexamfetamine (VYVANSE) 30 MG capsule Take 1 capsule (30 mg total) by mouth daily. 30 capsule 0  ? sertraline (ZOLOFT) 100 MG tablet Take 1.5 tablets (150 mg total) by mouth da

## 2021-08-09 NOTE — Addendum Note (Signed)
Addended by: Kirstie Peri on: 08/09/2021 11:00 AM ? ? Modules accepted: Orders ? ?

## 2021-09-07 ENCOUNTER — Other Ambulatory Visit: Payer: Self-pay

## 2021-09-07 ENCOUNTER — Telehealth: Payer: Self-pay | Admitting: Psychiatry

## 2021-09-07 DIAGNOSIS — F902 Attention-deficit hyperactivity disorder, combined type: Secondary | ICD-10-CM

## 2021-09-07 MED ORDER — LISDEXAMFETAMINE DIMESYLATE 30 MG PO CAPS: 30.0000 mg | ORAL_CAPSULE | Freq: Every day | ORAL | 0 refills | Status: DC

## 2021-09-07 NOTE — Telephone Encounter (Signed)
Pended.

## 2021-09-07 NOTE — Telephone Encounter (Signed)
Patient called in for refill on Vyvanse 30mg . Ph: [phone] Appt 7/12. Pharmacy [facility] [address] 9/12

## 2021-10-07 ENCOUNTER — Other Ambulatory Visit: Payer: Self-pay

## 2021-10-07 ENCOUNTER — Telehealth: Payer: Self-pay | Admitting: Psychiatry

## 2021-10-07 DIAGNOSIS — F902 Attention-deficit hyperactivity disorder, combined type: Secondary | ICD-10-CM

## 2021-10-07 MED ORDER — LISDEXAMFETAMINE DIMESYLATE 30 MG PO CAPS: 30.0000 mg | ORAL_CAPSULE | Freq: Every day | ORAL | 0 refills | Status: DC

## 2021-10-07 NOTE — Telephone Encounter (Signed)
Pt requesting RF Vyvanse 30 mg [facility] [address] 7/12

## 2021-10-07 NOTE — Telephone Encounter (Signed)
Pended.

## 2021-10-13 ENCOUNTER — Encounter: Payer: Self-pay | Admitting: Psychiatry

## 2021-10-13 ENCOUNTER — Ambulatory Visit (INDEPENDENT_AMBULATORY_CARE_PROVIDER_SITE_OTHER): Payer: 59 | Admitting: Psychiatry

## 2021-10-13 VITALS — BP 116/76 | HR 99

## 2021-10-13 DIAGNOSIS — F902 Attention-deficit hyperactivity disorder, combined type: Secondary | ICD-10-CM | POA: Diagnosis not present

## 2021-10-13 DIAGNOSIS — F411 Generalized anxiety disorder: Secondary | ICD-10-CM | POA: Diagnosis not present

## 2021-10-13 DIAGNOSIS — F4310 Post-traumatic stress disorder, unspecified: Secondary | ICD-10-CM | POA: Diagnosis not present

## 2021-10-13 DIAGNOSIS — F3181 Bipolar II disorder: Secondary | ICD-10-CM

## 2021-10-13 MED ORDER — LISDEXAMFETAMINE DIMESYLATE 30 MG PO CAPS: 30.0000 mg | ORAL_CAPSULE | Freq: Every day | ORAL | 0 refills | Status: DC

## 2021-10-13 MED ORDER — LAMOTRIGINE 150 MG PO TABS: 150.0000 mg | ORAL_TABLET | Freq: Every day | ORAL | 1 refills | Status: DC

## 2021-10-13 MED ORDER — SERTRALINE HCL 100 MG PO TABS: 150.0000 mg | ORAL_TABLET | Freq: Every day | ORAL | 1 refills | Status: DC

## 2021-10-13 NOTE — Progress Notes (Signed)
[name] [id number] 1990-07-19 31 y.o.    Subjective:   [name] is a 31 y.o. (DOB 16-Mar-1991) female.  Chief Complaint:  Chief Complaint  Patient presents with   Follow-up    Bipolar II disorder (HCC)   ADHD   Anxiety    Anxiety Symptoms include decreased concentration. Patient reports no confusion, dizziness, nervous/anxious behavior or suicidal ideas.     [name] presents to the office today for follow-up of Bipolar 2, and GAD with obsessional elements.    seen Nov 2020. Sertraline was reduced from 150 to 100 to reduce sexual SE.  Tooo irritable after a month.  Better with 150 mg daily.  Irritability generally managed.  08/15/2019 appointment with the following noted: Pretty good overall.  Taking Lamotrigine AM and noted fuse shorter and switched it to night and then insomnia.  Then switched lamotrigine and sertraline to AM and sleep and irritability are better now.  Less vivid dream which would before shake her up emotionally for a few days.  Periods of being overwhelmed still. Some days feels in quicksand and hard to get out of it and unmotivated and low energy.  Once going is OK.  Sometimes numbness to emotion esp re intimacy  With husband.  Low libido but doesn't interfere with pleasure in sex.   Anxiety pretty good except bothered by mask in a store.  Mostly DT the breathing issues. Zoloft increased postpartum related to [name] birth she had flashbacks.   Especially with 2 small kids. 2 weeks gone back to gym   Less afternoon slump.  Better energy and motivation. Sleep 8 hours. Plan: Due to sexual side effects attempt transition from sertraline to Viibryd.  09/12/2019 appointment select appointment phone call patient complaining of some tingling and dizziness immediately after transition to Viibryd 20 mg daily also with some sweating.  Did some improvement in more normal emotionality.  Was felt to could potentially be some residual symptoms  of serotonin withdrawal and to take a low dose of sertraline for 5 days and then stop it again.  09/27/2019 appointment with the following noted: Dizziness and tingly resolved with switch to Viibryd 20 and extra sertraline. [name] is shorter than on Zoloft.  Quick to anger.  Positive however is feeling emotions otherwise more normally.  Tearful with happy things.  Less numbed.  More enjoyment and interest sexually but not a lot of motivation. More emotionally connected to husband. No mood swings. Feels more productive and energetic so far too. Plan: increasse Viibryd to 30 mg to help irritability  12/02/19 appt with the following noted: Since last appt had to do peer-peer appeal of denial of coverage for Viibryrd which was successful. Now on Viibryd 40 mg daily for about a month. Doing pretty good with it.  Feels pretty level and better than at lower dose with less anger and short-fused and less irritable.  Don't feel like a zombie or super numb but don't feel like she'll snap.  Better with numbness vs Zoloft.  Less sexual SE than Zoloft but kids interfere.   Not depressed.  Some anxiety.  Some worry over baby's breathing and will check repeatedly but usually can let go after a while. Plan no med changes  03/03/2020 appointment with the following noted: Called in Oct and wanted to swtich back to Zoloft bc irritability and lack of patience and weird dreams. Episode of sleep paralysis when took it at night.  Happened 3 times with 3rd  time the worst. On sertraline 100 irritability and NM resolved.  H noticed Zoloft was better too.   Much more level.   Son disturbs sleep.   At first it felt more normal to be able to cry but that benefit was not worth the rest with Viibryd. Plan no med changes.  06/29/2020 appointment with the following noted: Depression after Xmas.   Struggles with [name] with ADHD and psych med trials.  Got into place where felt alone and bad about herself and wanted to sleep a  lot.   Not that dark in years.  A lot better now and happy now.  [name] doing a lot better and pt feels better now too.  Wonders about increasing Zoloft to 200 bc did so when down and it seemed to help.  Feels more productive and happy with 200 mg daily and less triggered with mood.  More productive. Taking Zoloft 100 mg daily now. Plan: Increase Zoloft to 150 mg  .  For reactivity and depression risk.   Cont lamotrigine 150 mg daily  03/15/21 appt noted: On sertraline 150 mg since here.   Hard time just go through motions.  Hard to enjoy. Tired a lot.  Depression.   Low motivation and productivity.  Still some irritabilty.  Sleep is normal unless son gets her up. Anxiety less of a problem. Plan: Abilify faster vs duloxetine switch.  Abilify potentiation. 2.5  up to 5 in a few days if needed.  05/11/2021 appointment with the following noted: Abilify was the missing link and mother noticed benefit in a few days.   No longer feels like a shell and more like a person.  Better energy.  Better function.  Less guilt and negativity. No SE [name] travels rough when he's gone. D ADHD and suspected autism.  Pending evaluation.  Has OT.  Difficult. [name] is 7 but has good memory. Patient reports stable mood and denies depressed or irritable moods.  Patient denies any recent difficulty with anxiety.  Patient denies difficulty with sleep initiation or maintenance. Denies appetite disturbance.  Patient reports that energy and motivation have been good.  Patient denies any difficulty with concentration.  Patient denies any suicidal ideation. Plan: No med changes.  Continue lamotrigine 150, Abilify 2.5, sertraline 150 mg daily  08/09/2021 appointment with the following noted: Doing pretty good.  No sig depression episodes.   Asks about ADHD.  Find it hard to get stuff done.  Task paralysis.  Initiative problems.  D has ADHD.  Has trouble getting things finished for example painting rooms.  Can get overstimulated  with sound.  Overwhelmed with tasks and finishing them. Not losing things.  Getting things organized is difficult.   Driving is OK bc fear accidents.  Can interrupt in conversations bc sense of urgency.   Had trouble with timed and other tests and would freeze up with getting overwhelmed. Fidgety.  Hard to sit and watch a movie without doing something else with her hands. Sleep 8 hours. B started Vyvanse with benefit.  H doesn't have ADHD. P;an: Vyvanse 30 mg AM  10/13/21 appt noted: No Abilify. On sertraline 100 and lamotrigine 150.  On PNV. Vyvanse changed my world.  Better function.  Less irritable.  Buffer now.  Less overwhelmed.  Mind quieter on Vyvanse. Pregnant as of July 1.  Talked to on call OB bc didn't want to give up the benefit. OB Ok'd lamotrigine and sertraline and low dose Vyvanse.  So can stay on  it. SE less appetite but is eating normally meals. First [place] tomorrow.  [name] was born via emergency C-section November 18, 2017 and swallowed fluid resulting in hypoxia and a NICU stay of 5 days.  Patient had some PTSD symptoms thereafter but those have largely resolved.  Past Psychiatric Medication Trials:  Lexapro, viibryd 40 poor response,  Zoloft 150 Depakote, Lamotrigine,                                                Increase the lamotrigine and sertraline to 150mg  each in January 2019. Was depressed at the visit in December but symptoms were resolved with the addition of Abilify 2.5 mg daily Note: Mother also with a similar pattern of mood disorder, bipolar type II.  M and B on Cymbalta.                    Review of Systems:  Review of Systems  Constitutional:  Negative for fatigue.  Gastrointestinal:  Negative for diarrhea.  Neurological:  Negative for dizziness and tremors.  Psychiatric/Behavioral:  Positive for decreased concentration. Negative for agitation, behavioral problems, confusion, dysphoric mood, hallucinations, self-injury, sleep disturbance and suicidal  ideas. The patient is not nervous/anxious and is not hyperactive.     Medications: I have reviewed the patient's current medications.  Current Outpatient Medications  Medication Sig Dispense Refill   albuterol (PROVENTIL HFA;VENTOLIN HFA) 108 (90 BASE) MCG/ACT inhaler Inhale 2 puffs into the lungs every 6 (six) hours as needed for wheezing. 1 Inhaler 0   [START ON 11/10/2021] lisdexamfetamine (VYVANSE) 30 MG capsule Take 1 capsule (30 mg total) by mouth daily. 30 capsule 0   [START ON 12/08/2021] lisdexamfetamine (VYVANSE) 30 MG capsule Take 1 capsule (30 mg total) by mouth daily. 30 capsule 0   Prenatal MV & Min w/FA-DHA (PRENATAL GUMMIES) 0.18-25 MG CHEW Chew by mouth.     lamoTRIgine (LAMICTAL) 150 MG tablet Take 1 tablet (150 mg total) by mouth daily. 90 tablet 1   lisdexamfetamine (VYVANSE) 30 MG capsule Take 1 capsule (30 mg total) by mouth daily. 30 capsule 0   sertraline (ZOLOFT) 100 MG tablet Take 1.5 tablets (150 mg total) by mouth daily. 135 tablet 1   No current facility-administered medications for this visit.    Medication Side Effects: None, sexual SE  Allergies: No Known Allergies  Past Medical History:  Diagnosis Date   ALLERGIC RHINITIS    seasonal   Depression    Dyslipidemia 01/11/2013   CPX 01/2013 dx - Recommend diet/aerobic exercise changes with weight reduction   Exercise-induced asthma    Generalized anxiety disorder    Migraine    PCOS (polycystic ovarian syndrome)     Family History  Problem Relation Age of Onset   Arthritis Mother    Arthritis Father    Alcohol abuse Maternal Grandmother    Hyperlipidemia Maternal Grandmother    Stroke Maternal Grandmother    Hyperlipidemia Maternal Grandfather    Hyperlipidemia Paternal Grandmother    Hyperlipidemia Paternal Grandfather    Hypertension Paternal Grandfather    Mother also has bipolar disorder type II plus anxiety Social History   Socioeconomic History   Marital status: Married    Spouse  name: Not on file   Number of children: Not on file   Years of education: Not on file   Highest education  level: Not on file  Occupational History   Not on file  Tobacco Use   Smoking status: Never   Smokeless tobacco: Never  Substance and Sexual Activity   Alcohol use: No   Drug use: No   Sexual activity: Not on file  Other Topics Concern   Not on file  Social History Narrative   Not on file   Social Determinants of Health   Financial Resource Strain: Not on file  Food Insecurity: Not on file  Transportation Needs: Not on file  Physical Activity: Not on file  Stress: Not on file  Social Connections: Not on file  Intimate Partner Violence: Not on file    Past Medical History, Surgical history, Social history, and Family history were reviewed and updated as appropriate.   Please see review of systems for further details on the patient's review from today.   Objective:   Physical Exam:  BP 116/76   Pulse 99   Physical Exam Constitutional:      General: She is not in acute distress. Musculoskeletal:        General: No deformity.  Neurological:     Mental Status: She is alert and oriented to person, place, and time.     Cranial Nerves: No dysarthria.     Coordination: Coordination normal.  Psychiatric:        Attention and Perception: Perception normal. She is inattentive. She does not perceive auditory or visual hallucinations.        Mood and Affect: Mood is not anxious or depressed. Affect is not labile, blunt, angry or inappropriate.        Speech: Speech normal.        Behavior: Behavior normal. Behavior is not agitated. Behavior is cooperative.        Thought Content: Thought content normal. Thought content is not paranoid or delusional. Thought content does not include homicidal or suicidal ideation. Thought content does not include suicidal plan.        Cognition and Memory: Cognition and memory normal.        Judgment: Judgment normal.     Comments: No  manic symptoms.  Insight good.   Resolved depression and anhedonia.      Lab Review:     Component Value Date/Time   NA 138 01/21/2015 1051   K 4.4 01/21/2015 1051   CL 105 01/21/2015 1051   CO2 25 01/21/2015 1051   GLUCOSE 82 01/21/2015 1051   BUN 13 01/21/2015 1051   CREATININE 0.64 01/21/2015 1051   CALCIUM 9.6 01/21/2015 1051   PROT 7.6 01/21/2015 1051   ALBUMIN 4.1 01/21/2015 1051   AST 12 01/21/2015 1051   ALT 15 01/21/2015 1051   ALKPHOS 42 01/21/2015 1051   BILITOT 0.4 01/21/2015 1051   GFRNONAA >90 10/22/2013 2000   GFRAA >90 10/22/2013 2000       Component Value Date/Time   WBC 9.0 11/19/2016 0519   RBC 3.47 (L) 11/19/2016 0519   HGB 10.6 (L) 11/19/2016 0519   HCT 31.5 (L) 11/19/2016 0519   PLT 152 11/19/2016 0519   MCV 90.8 11/19/2016 0519   MCH 30.5 11/19/2016 0519   MCHC 33.7 11/19/2016 0519   RDW 14.5 11/19/2016 0519   LYMPHSABS 2.5 01/21/2015 1051   MONOABS 0.7 01/21/2015 1051   EOSABS 0.1 01/21/2015 1051   BASOSABS 0.0 01/21/2015 1051    No results found for: "POCLITH", "LITHIUM"   No results found for: "PHENYTOIN", "PHENOBARB", "VALPROATE", "CBMZ"   .res  Assessment: Plan:    Bipolar II disorder (HCC) - Plan: lamoTRIgine (LAMICTAL) 150 MG tablet  Attention deficit hyperactivity disorder (ADHD), combined type - Plan: lisdexamfetamine (VYVANSE) 30 MG capsule, lisdexamfetamine (VYVANSE) 30 MG capsule, lisdexamfetamine (VYVANSE) 30 MG capsule  Generalized anxiety disorder - Plan: sertraline (ZOLOFT) 100 MG tablet  PTSD (post-traumatic stress disorder) - Plan: sertraline (ZOLOFT) 100 MG tablet   ADHD ASRS scale 18 inattentive; 31 hyperactivity= high score  Greater than 50% of 30 min face to face time with patient was spent on counseling and coordination of care. We discussed symptoms are well controlled at this time.  Tolerating meds well.   Disc the risk.    Ok so far off Abilify with a little more moodiness.    Counseled patient regarding  potential benefits, risks, and side effects of Lamictal to include potential risk of Stevens-Johnson syndrome. Advised patient to stop taking Lamictal and contact office immediately if rash develops and to seek urgent medical attention if rash is severe and/or spreading quickly.  Supportive therapy re: dealing with D's emotional problems and son's developmental delays  No better with Increase Zoloft to 150 mg she reduced to 100 mg when pregnant. .  For reactivity and depression risk.  Disc risk mood cycling.  She and mother have done well with it.  Felt ok off Abilify but a tad bit moodier.  Cont lamotrigine 150 mg daily  Discussed potential benefits, risks, and side effects of stimulants with patient to include increased heart rate, palpitations, insomnia, increased anxiety, increased irritability, or decreased appetite.  Instructed patient to contact office if experiencing any significant tolerability issues. Disc risk mania in bipolar pts.  She wants to continue stimulant bc marked benefit from low dose. Disc with OB [facility].  Disc risks stimulant in pregnancy in detail but can monitor BP and weight gain. Vyvanse 30 mg AM  FU 3 mos  Meredith Staggers, MD, DFAPA   Please see After Visit Summary for patient specific instructions.  No future appointments.   No orders of the defined types were placed in this encounter.      -------------------------------

## 2021-10-22 LAB — OB RESULTS CONSOLE ABO/RH: RH Type: POSITIVE

## 2021-10-22 LAB — OB RESULTS CONSOLE HEPATITIS B SURFACE ANTIGEN: Hepatitis B Surface Ag: NEGATIVE

## 2021-10-22 LAB — OB RESULTS CONSOLE GC/CHLAMYDIA
Chlamydia: NEGATIVE
Neisseria Gonorrhea: NEGATIVE

## 2021-10-22 LAB — OB RESULTS CONSOLE HIV ANTIBODY (ROUTINE TESTING): HIV: NONREACTIVE

## 2021-10-22 LAB — OB RESULTS CONSOLE RPR: RPR: NONREACTIVE

## 2021-10-22 LAB — OB RESULTS CONSOLE ANTIBODY SCREEN: Antibody Screen: NEGATIVE

## 2021-10-22 LAB — HEPATITIS C ANTIBODY: HCV Ab: NEGATIVE

## 2021-10-22 LAB — OB RESULTS CONSOLE RUBELLA ANTIBODY, IGM: Rubella: IMMUNE

## 2021-11-27 ENCOUNTER — Other Ambulatory Visit: Payer: Self-pay | Admitting: Psychiatry

## 2021-11-27 DIAGNOSIS — F3181 Bipolar II disorder: Secondary | ICD-10-CM

## 2021-12-25 ENCOUNTER — Other Ambulatory Visit: Payer: Self-pay | Admitting: Psychiatry

## 2021-12-25 DIAGNOSIS — F3181 Bipolar II disorder: Secondary | ICD-10-CM

## 2022-01-13 ENCOUNTER — Ambulatory Visit (INDEPENDENT_AMBULATORY_CARE_PROVIDER_SITE_OTHER): Payer: 59 | Admitting: Psychiatry

## 2022-01-13 ENCOUNTER — Encounter: Payer: Self-pay | Admitting: Psychiatry

## 2022-01-13 VITALS — BP 111/64 | HR 121

## 2022-01-13 DIAGNOSIS — F902 Attention-deficit hyperactivity disorder, combined type: Secondary | ICD-10-CM

## 2022-01-13 DIAGNOSIS — F3181 Bipolar II disorder: Secondary | ICD-10-CM | POA: Diagnosis not present

## 2022-01-13 DIAGNOSIS — F4310 Post-traumatic stress disorder, unspecified: Secondary | ICD-10-CM

## 2022-01-13 DIAGNOSIS — F411 Generalized anxiety disorder: Secondary | ICD-10-CM

## 2022-01-13 MED ORDER — LISDEXAMFETAMINE DIMESYLATE 30 MG PO CAPS: 30.0000 mg | ORAL_CAPSULE | Freq: Every day | ORAL | 0 refills | Status: DC

## 2022-01-13 MED ORDER — LAMOTRIGINE 150 MG PO TABS: 150.0000 mg | ORAL_TABLET | Freq: Every day | ORAL | 1 refills | Status: DC

## 2022-01-13 NOTE — Progress Notes (Signed)
Felicia Young [id number] February 02, 1991 31 y.o.    Subjective:   [name] is a 31 y.o. (DOB 09/07/1990) female.  Chief Complaint:  Chief Complaint  Patient presents with   Follow-up    Bipolar II disorder ([name])   Anxiety   ADHD   Post-Traumatic Stress Disorder    Anxiety Symptoms include decreased concentration and nausea. Patient reports no confusion, dizziness, nervous/anxious behavior or suicidal ideas.     Felicia Young presents to the office today for follow-up of Bipolar 2, and GAD with obsessional elements.    seen Nov 2020. Sertraline was reduced from 150 to 100 to reduce sexual SE.  Tooo irritable after a month.  Better with 150 mg daily.  Irritability generally managed.  08/15/2019 appointment with the following noted: Pretty good overall.  Taking Lamotrigine AM and noted fuse shorter and switched it to night and then insomnia.  Then switched lamotrigine and sertraline to AM and sleep and irritability are better now.  Less vivid dream which would before shake her up emotionally for a few days.  Periods of being overwhelmed still. Some days feels in quicksand and hard to get out of it and unmotivated and low energy.  Once going is OK.  Sometimes numbness to emotion esp re intimacy  With husband.  Low libido but doesn't interfere with pleasure in sex.   Anxiety pretty good except bothered by mask in a store.  Mostly DT the breathing issues. Zoloft increased postpartum related to Felicia Young's birth she had flashbacks.   Especially with 2 small kids. 2 weeks gone back to gym   Less afternoon slump.  Better energy and motivation. Sleep 8 hours. Plan: Due to sexual side effects attempt transition from sertraline to Felicia Young.  09/12/2019 appointment select appointment phone call patient complaining of some tingling and dizziness immediately after transition to Viibryd 20 mg daily also with some sweating.  Did some improvement in more normal emotionality.  Was felt to  could potentially be some residual symptoms of serotonin withdrawal and to take a low dose of sertraline for 5 days and then stop it again.  09/27/2019 appointment with the following noted: Dizziness and tingly resolved with switch to Viibryd 20 and extra sertraline. Felicia Young Plan is shorter than on Zoloft.  Quick to anger.  Positive however is feeling emotions otherwise more normally.  Tearful with happy things.  Less numbed.  More enjoyment and interest sexually but not a lot of motivation. More emotionally connected to husband. No mood swings. Feels more productive and energetic so far too. Plan: increasse Viibryd to 30 mg to help irritability  12/02/19 appt with the following noted: Since last appt had to do peer-peer appeal of denial of coverage for Viibryrd which was successful. Now on Viibryd 40 mg daily for about a month. Doing pretty good with it.  Feels pretty level and better than at lower dose with less anger and short-fused and less irritable.  Don't feel like a zombie or super numb but don't feel like she'll snap.  Better with numbness vs Zoloft.  Less sexual SE than Zoloft but kids interfere.   Not depressed.  Some anxiety.  Some worry over baby's breathing and will check repeatedly but usually can let go after a while. Plan no med changes  03/03/2020 appointment with the following noted: Called in Oct and wanted to swtich back to Zoloft bc irritability and lack of patience and weird dreams. Episode of sleep paralysis when took it at  night.  Happened 3 times with 3rd time the worst. On sertraline 100 irritability and NM resolved.  H noticed Zoloft was better too.   Much more level.   Son disturbs sleep.   At first it felt more normal to be able to cry but that benefit was not worth the rest with Viibryd. Plan no med changes.  06/29/2020 appointment with the following noted: Depression after Xmas.   Struggles with Felicia Young with ADHD and psych med trials.  Got into place where felt alone  and bad about herself and wanted to sleep a lot.   Not that dark in years.  A lot better now and happy now.  Felicia Young doing a lot better and pt feels better now too.  Wonders about increasing Zoloft to 200 bc did so when down and it seemed to help.  Feels more productive and happy with 200 mg daily and less triggered with mood.  More productive. Taking Zoloft 100 mg daily now. Plan: Increase Zoloft to 150 mg  .  For reactivity and depression risk.   Cont lamotrigine 150 mg daily  03/15/21 appt noted: On sertraline 150 mg since here.   Hard time just go through motions.  Hard to enjoy. Tired a lot.  Depression.   Low motivation and productivity.  Still some irritabilty.  Sleep is normal unless son gets her up. Anxiety less of a problem. Plan: Abilify faster vs duloxetine switch.  Abilify potentiation. 2.5  up to 5 in a few days if needed.  05/11/2021 appointment with the following noted: Abilify was the missing link and mother noticed benefit in a few days.   No longer feels like a shell and more like a person.  Better energy.  Better function.  Less guilt and negativity. No SE H Felicia Young travels rough when he's gone. D ADHD and suspected autism.  Pending evaluation.  Has OT.  Difficult. Felicia Young is 6 but has good memory. Patient reports stable mood and denies depressed or irritable moods.  Patient denies any recent difficulty with anxiety.  Patient denies difficulty with sleep initiation or maintenance. Denies appetite disturbance.  Patient reports that energy and motivation have been good.  Patient denies any difficulty with concentration.  Patient denies any suicidal ideation. Plan: No med changes.  Continue lamotrigine 150, Abilify 2.5, sertraline 150 mg daily  08/09/2021 appointment with the following noted: Doing pretty good.  No sig depression episodes.   Asks about ADHD.  Find it hard to get stuff done.  Task paralysis.  Initiative problems.  D has ADHD.  Has trouble getting things finished for  example painting rooms.  Can get overstimulated with sound.  Overwhelmed with tasks and finishing them. Not losing things.  Getting things organized is difficult.   Driving is OK bc fear accidents.  Can interrupt in conversations bc sense of urgency.   Had trouble with timed and other tests and would freeze up with getting overwhelmed. Fidgety.  Hard to sit and watch a movie without doing something else with her hands. Sleep 8 hours. B started Vyvanse with benefit.  H doesn't have ADHD. P;an: Vyvanse 30 mg AM  10/13/21 appt noted: No Abilify. On sertraline 100 and lamotrigine 150.  On PNV. Vyvanse changed my world.  Better function.  Less irritable.  Buffer now.  Less overwhelmed.  Mind quieter on Vyvanse. Pregnant as of July 1.  Talked to on call OB bc didn't want to give up the benefit. OB Ok'd lamotrigine and sertraline and low  dose Vyvanse.  So can stay on it. SE less appetite but is eating normally meals. First [place] tomorrow. Plan: No med changes.  Continue Vyvanse 30 mg every morning, sertraline 100 mg daily, lamotrigine 150 mg daily  01/13/22 appt noted: Med wise OK. Pregnant and misses Abilify and doesn't feels she can go higher on Vyvanse.  Enough to get through pregnancy but looks forward to getting back on normal doses and increasing Vyvanse. Misses Abilify enhancing Zoloft.  Higher doses Zoloft don't help. Stress at home.  Marital issues which H doesn't see.   Pregnant [redacted] weeks.  C-section sched 05/17/22. No problems so far.  Boy. Exhausted with pregnancy and nausea with food aversions.  Not weighed herself in a couple of weeks.  Lost 10# since beginning of pregnancy. More pt on vyvanse than before it.  Felicia Young was born via emergency C-section November 18, 2017 and swallowed fluid resulting in hypoxia and a NICU stay of 5 days.  Patient had some PTSD symptoms thereafter but those have largely resolved.  Past Psychiatric Medication Trials:  Lexapro, viibryd 40 poor response,  Zoloft  150 Depakote, Lamotrigine,                                                Increase the lamotrigine and sertraline to 150mg  each in January 2019. Was depressed at the visit in December but symptoms were resolved with the addition of Abilify 2.5 mg daily Note: Mother also with a similar pattern of mood disorder, bipolar type II.  M and B on Cymbalta.                    Review of Systems:  Review of Systems  Constitutional:  Negative for fatigue.  Gastrointestinal:  Positive for nausea. Negative for diarrhea.  Neurological:  Negative for dizziness and tremors.  Psychiatric/Behavioral:  Positive for decreased concentration. Negative for agitation, behavioral problems, confusion, dysphoric mood, hallucinations, self-injury, sleep disturbance and suicidal ideas. The patient is not nervous/anxious and is not hyperactive.     Medications: I have reviewed the patient's current medications.  Current Outpatient Medications  Medication Sig Dispense Refill   albuterol (PROVENTIL HFA;VENTOLIN HFA) 108 (90 BASE) MCG/ACT inhaler Inhale 2 puffs into the lungs every 6 (six) hours as needed for wheezing. 1 Inhaler 0   Prenatal MV & Min w/FA-DHA (PRENATAL GUMMIES) 0.18-25 MG CHEW Chew by mouth.     sertraline (ZOLOFT) 100 MG tablet Take 1.5 tablets (150 mg total) by mouth daily. (Patient taking differently: Take 150 mg by mouth daily. 1 tab) 135 tablet 1   lamoTRIgine (LAMICTAL) 150 MG tablet Take 1 tablet (150 mg total) by mouth daily. 90 tablet 1   [START ON 03/10/2022] lisdexamfetamine (VYVANSE) 30 MG capsule Take 1 capsule (30 mg total) by mouth daily. 30 capsule 0   [START ON 02/10/2022] lisdexamfetamine (VYVANSE) 30 MG capsule Take 1 capsule (30 mg total) by mouth daily. 30 capsule 0   lisdexamfetamine (VYVANSE) 30 MG capsule Take 1 capsule (30 mg total) by mouth daily. 30 capsule 0   No current facility-administered medications for this visit.    Medication Side Effects: None, sexual SE  Allergies:  No Known Allergies  Past Medical History:  Diagnosis Date   ALLERGIC RHINITIS    seasonal   Depression    Dyslipidemia 01/11/2013   CPX 01/2013 dx -  Recommend diet/aerobic exercise changes with weight reduction   Exercise-induced asthma    Generalized anxiety disorder    Migraine    PCOS (polycystic ovarian syndrome)     Family History  Problem Relation Age of Onset   Arthritis Mother    Arthritis Father    Alcohol abuse Maternal Grandmother    Hyperlipidemia Maternal Grandmother    Stroke Maternal Grandmother    Hyperlipidemia Maternal Grandfather    Hyperlipidemia Paternal Grandmother    Hyperlipidemia Paternal Grandfather    Hypertension Paternal Grandfather    Mother also has bipolar disorder type II plus anxiety Social History   Socioeconomic History   Marital status: Married    Spouse name: Not on file   Number of children: Not on file   Years of education: Not on file   Highest education level: Not on file  Occupational History   Not on file  Tobacco Use   Smoking status: Never   Smokeless tobacco: Never  Substance and Sexual Activity   Alcohol use: No   Drug use: No   Sexual activity: Not on file  Other Topics Concern   Not on file  Social History Narrative   Not on file   Social Determinants of Health   Financial Resource Strain: Not on file  Food Insecurity: Not on file  Transportation Needs: Not on file  Physical Activity: Not on file  Stress: Not on file  Social Connections: Not on file  Intimate Partner Violence: Not on file    Past Medical History, Surgical history, Social history, and Family history were reviewed and updated as appropriate.   Please see review of systems for further details on the patient's review from today.   Objective:   Physical Exam:  BP 111/64   Pulse (!) 121   Physical Exam Constitutional:      General: She is not in acute distress. Musculoskeletal:        General: No deformity.  Neurological:      Mental Status: She is alert and oriented to person, place, and time.     Cranial Nerves: No dysarthria.     Coordination: Coordination normal.  Psychiatric:        Attention and Perception: Perception normal. She is inattentive. She does not perceive auditory or visual hallucinations.        Mood and Affect: Mood is not anxious or depressed. Affect is not labile, blunt, angry or inappropriate.        Speech: Speech normal.        Behavior: Behavior normal. Behavior is not agitated. Behavior is cooperative.        Thought Content: Thought content normal. Thought content is not paranoid or delusional. Thought content does not include homicidal or suicidal ideation. Thought content does not include suicidal plan.        Cognition and Memory: Cognition and memory normal.        Judgment: Judgment normal.     Comments: No manic symptoms.  Insight good.   Resolved depression and anhedonia generally but stressed.      Lab Review:     Component Value Date/Time   NA 138 01/21/2015 1051   K 4.4 01/21/2015 1051   CL 105 01/21/2015 1051   CO2 25 01/21/2015 1051   GLUCOSE 82 01/21/2015 1051   BUN 13 01/21/2015 1051   CREATININE 0.64 01/21/2015 1051   CALCIUM 9.6 01/21/2015 1051   PROT 7.6 01/21/2015 1051   ALBUMIN 4.1 01/21/2015 1051  AST 12 01/21/2015 1051   ALT 15 01/21/2015 1051   ALKPHOS 42 01/21/2015 1051   BILITOT 0.4 01/21/2015 1051   GFRNONAA >90 10/22/2013 2000   GFRAA >90 10/22/2013 2000       Component Value Date/Time   WBC 9.0 11/19/2016 0519   RBC 3.47 (L) 11/19/2016 0519   HGB 10.6 (L) 11/19/2016 0519   HCT 31.5 (L) 11/19/2016 0519   PLT 152 11/19/2016 0519   MCV 90.8 11/19/2016 0519   MCH 30.5 11/19/2016 0519   MCHC 33.7 11/19/2016 0519   RDW 14.5 11/19/2016 0519   LYMPHSABS 2.5 01/21/2015 1051   MONOABS 0.7 01/21/2015 1051   EOSABS 0.1 01/21/2015 1051   BASOSABS 0.0 01/21/2015 1051    No results found for: "POCLITH", "LITHIUM"   No results found for:  "PHENYTOIN", "PHENOBARB", "VALPROATE", "CBMZ"   .res Assessment: Plan:    Bipolar II disorder ([place]) - Plan: lamoTRIgine (LAMICTAL) 150 MG tablet  Generalized anxiety disorder  PTSD (post-traumatic stress disorder)  Attention deficit hyperactivity disorder (ADHD), combined type - Plan: lisdexamfetamine (VYVANSE) 30 MG capsule, lisdexamfetamine (VYVANSE) 30 MG capsule, lisdexamfetamine (VYVANSE) 30 MG capsule   ADHD ASRS scale 18 inattentive; 31 hyperactivity= high score  Greater than 50% of 30 min face to face time with patient was spent on counseling and coordination of care. We discussed symptoms are well controlled at this time.  Tolerating meds well.   Disc the risk.    Ok so far off Abilify with a little more moodiness.    Counseled patient regarding potential benefits, risks, and side effects of Lamictal to include potential risk of Stevens-Johnson syndrome. Advised patient to stop taking Lamictal and contact office immediately if rash develops and to seek urgent medical attention if rash is severe and/or spreading quickly.  Supportive therapy re: dealing with D's emotional problems and son's developmental delays  No better with Increase Zoloft to 150 mg she reduced to 100 mg when pregnant. .  For reactivity and depression risk.  Disc risk mood cycling.  She and mother have done well with it.  Felt ok off Abilify but a bit moodier.  Cont lamotrigine 150 mg daily  Discussed potential benefits, risks, and side effects of stimulants with patient to include increased heart rate, palpitations, insomnia, increased anxiety, increased irritability, or decreased appetite.  Instructed patient to contact office if experiencing any significant tolerability issues. Disc risk mania in bipolar pts.  She wants to continue stimulant bc marked benefit from low dose. Disc with OB [facility].  Disc risks stimulant in pregnancy in detail but can monitor BP and weight gain. No increase Vyvanse per OB  and also bc not gaining wt DT prgnancy nausea. Vyvanse 30 mg AM  Extensive discussion of ok breast feeding with this med list.  FU 4-5 mos  Lynder Parents, MD, DFAPA   Please see After Visit Summary for patient specific instructions.  No future appointments.   No orders of the defined types were placed in this encounter.      -------------------------------

## 2022-01-14 ENCOUNTER — Other Ambulatory Visit: Payer: Self-pay

## 2022-01-14 ENCOUNTER — Inpatient Hospital Stay (HOSPITAL_COMMUNITY)
Admission: AD | Admit: 2022-01-14 | Discharge: 2022-01-14 | Disposition: A | Discharge status: Home / Self Care | Payer: 59 | Attending: Obstetrics and Gynecology | Admitting: Obstetrics and Gynecology

## 2022-01-14 ENCOUNTER — Encounter (HOSPITAL_COMMUNITY): Payer: Self-pay

## 2022-01-14 DIAGNOSIS — R519 Headache, unspecified: Secondary | ICD-10-CM | POA: Diagnosis not present

## 2022-01-14 DIAGNOSIS — J45990 Exercise induced bronchospasm: Secondary | ICD-10-CM | POA: Insufficient documentation

## 2022-01-14 DIAGNOSIS — R000 Tachycardia, unspecified: Secondary | ICD-10-CM

## 2022-01-14 DIAGNOSIS — O26892 Other specified pregnancy related conditions, second trimester: Secondary | ICD-10-CM | POA: Diagnosis present

## 2022-01-14 DIAGNOSIS — O99512 Diseases of the respiratory system complicating pregnancy, second trimester: Secondary | ICD-10-CM | POA: Insufficient documentation

## 2022-01-14 DIAGNOSIS — R0602 Shortness of breath: Secondary | ICD-10-CM | POA: Diagnosis not present

## 2022-01-14 DIAGNOSIS — F419 Anxiety disorder, unspecified: Secondary | ICD-10-CM | POA: Insufficient documentation

## 2022-01-14 DIAGNOSIS — O99342 Other mental disorders complicating pregnancy, second trimester: Secondary | ICD-10-CM | POA: Diagnosis not present

## 2022-01-14 DIAGNOSIS — Z3A21 21 weeks gestation of pregnancy: Secondary | ICD-10-CM

## 2022-01-14 DIAGNOSIS — R0789 Other chest pain: Secondary | ICD-10-CM | POA: Insufficient documentation

## 2022-01-14 HISTORY — DX: Attention-deficit hyperactivity disorder, unspecified type: F90.9

## 2022-01-14 LAB — CBC
HCT: 35.8 % — ABNORMAL LOW (ref 36.0–46.0)
Hemoglobin: 12 g/dL (ref 12.0–15.0)
MCH: 31.5 pg (ref 26.0–34.0)
MCHC: 33.5 g/dL (ref 30.0–36.0)
MCV: 94 fL (ref 80.0–100.0)
Platelets: 240 10*3/uL (ref 150–400)
RBC: 3.81 MIL/uL — ABNORMAL LOW (ref 3.87–5.11)
RDW: 13.3 % (ref 11.5–15.5)
WBC: 10.5 10*3/uL (ref 4.0–10.5)
nRBC: 0 % (ref 0.0–0.2)

## 2022-01-14 LAB — URINALYSIS, ROUTINE W REFLEX MICROSCOPIC
Bilirubin Urine: NEGATIVE
Glucose, UA: NEGATIVE mg/dL
Hgb urine dipstick: NEGATIVE
Ketones, ur: NEGATIVE mg/dL
Leukocytes,Ua: NEGATIVE
Nitrite: NEGATIVE
Protein, ur: NEGATIVE mg/dL
Specific Gravity, Urine: 1.021 (ref 1.005–1.030)
pH: 5 (ref 5.0–8.0)

## 2022-01-14 MED ORDER — FLUTICASONE PROPIONATE HFA 44 MCG/ACT IN AERO: 2.0000 | INHALATION_SPRAY | Freq: Two times a day (BID) | RESPIRATORY_TRACT | 2 refills | Status: DC

## 2022-01-14 MED ORDER — ALBUTEROL SULFATE HFA 108 (90 BASE) MCG/ACT IN AERS: 2.0000 | INHALATION_SPRAY | Freq: Four times a day (QID) | RESPIRATORY_TRACT | 1 refills | Status: DC | PRN

## 2022-01-14 NOTE — MAU Note (Signed)
Felicia Young is a 31 y.o. at [redacted]w[redacted]d here in MAU reporting: for the past 2 weeks has felt like her heart has been beating faster, the past few days it has been more consistent. States resting HR was 100. Has been feeling lightheaded and out of breath. Has a headache. Also reports some chest tightness.  Onset of complaint: ongoing  Pain score: 4/10  Vitals:   01/14/22 1122  BP: 118/76  Pulse: (!) 102  Resp: 16  Temp: 98.2 F (36.8 C)  SpO2: 98%     FHT:160  Lab orders placed from triage: none

## 2022-01-14 NOTE — Discharge Instructions (Signed)
-   please use flovent inhaler twice daily for the next 2-3 weeks  - Take albuterol as prescribed, and then 2 puffs at night to help with night time cough  - I will let you know if you what your results show.

## 2022-01-14 NOTE — MAU Provider Note (Signed)
History     [id number]  Arrival date and time: 01/14/22 1102    Chief Complaint  Patient presents with   Headache   Tachycardia   HPI Felicia Young is a 31 y.o. at [redacted]w[redacted]d with PMHx notable for exercise induced asthma, who presents for h/ache, feeling of her heart racing, intermittent shortness or breath on exertion and chest tightness.  Symptoms have been on and off for the past 2 weeks. Has a baseline history of exercise induced asthma, and some background stress with family life. No chest pain, +night time cough. No shortness or breath at rest, no recent viral uri type symptoms.  vaginal bleeding: No LOF: No Fetal Movement: No Contractions: No    OB History     Gravida  3   Para  2   Term  2   Preterm      AB      Living  2      SAB      IAB      Ectopic      Multiple  0   Live Births  2           Past Medical History:  Diagnosis Date   ADHD    ALLERGIC RHINITIS    seasonal   Depression    Dyslipidemia 01/11/2013   CPX 01/2013 dx - Recommend diet/aerobic exercise changes with weight reduction   Exercise-induced asthma    Generalized anxiety disorder    Migraine    PCOS (polycystic ovarian syndrome)     Past Surgical History:  Procedure Laterality Date   CESAREAN SECTION N/A 10/23/2013   Procedure: Primary Cesarean Section Delivery Baby Girl @ 22 08, Apgars 7/9;  Surgeon: Logan Bores, MD;  Location: [facility];  Service: Obstetrics;  Laterality: N/A;   CESAREAN SECTION N/A 11/18/2016   Procedure: REPEAT CESAREAN SECTION;  Surgeon: Paula Compton, MD;  Location: [facility];  Service: Obstetrics;  Laterality: N/A;  MD requests RNFA   ORIF RADIAL FRACTURE Left 1999   WISDOM TOOTH EXTRACTION  2011    Family History  Problem Relation Age of Onset   Arthritis Mother    Arthritis Father    Alcohol abuse Maternal Grandmother    Hyperlipidemia Maternal Grandmother    Stroke Maternal Grandmother    Hyperlipidemia Maternal  Grandfather    Hyperlipidemia Paternal Grandmother    Hyperlipidemia Paternal Grandfather    Hypertension Paternal Grandfather     Social History   Socioeconomic History   Marital status: Married    Spouse name: Not on file   Number of children: Not on file   Years of education: Not on file   Highest education level: Not on file  Occupational History   Not on file  Tobacco Use   Smoking status: Former    Types: Cigarettes   Smokeless tobacco: Never  Vaping Use   Vaping Use: Former  Substance and Sexual Activity   Alcohol use: No   Drug use: No   Sexual activity: Not on file  Other Topics Concern   Not on file  Social History Narrative   Not on file   Social Determinants of Health   Financial Resource Strain: Not on file  Food Insecurity: Not on file  Transportation Needs: Not on file  Physical Activity: Not on file  Stress: Not on file  Social Connections: Not on file  Intimate Partner Violence: Not on file    No Known Allergies  No current facility-administered  medications on file prior to encounter.   Current Outpatient Medications on File Prior to Encounter  Medication Sig Dispense Refill   lamoTRIgine (LAMICTAL) 150 MG tablet Take 1 tablet (150 mg total) by mouth daily. 90 tablet 1   [START ON 02/10/2022] lisdexamfetamine (VYVANSE) 30 MG capsule Take 1 capsule (30 mg total) by mouth daily. 30 capsule 0   Prenatal MV & Min w/FA-DHA (PRENATAL GUMMIES) 0.18-25 MG CHEW Chew by mouth.     sertraline (ZOLOFT) 100 MG tablet Take 1.5 tablets (150 mg total) by mouth daily. (Patient taking differently: Take 150 mg by mouth daily. 1 tab) 135 tablet 1   [START ON 03/10/2022] lisdexamfetamine (VYVANSE) 30 MG capsule Take 1 capsule (30 mg total) by mouth daily. 30 capsule 0   lisdexamfetamine (VYVANSE) 30 MG capsule Take 1 capsule (30 mg total) by mouth daily. 30 capsule 0     Review of Systems  Constitutional:  Negative for chills and fever.  Eyes:  Negative for  blurred vision.  Cardiovascular:  Negative for leg swelling.  Gastrointestinal:  Negative for abdominal pain and vomiting.  Genitourinary:  Negative for dysuria, frequency and urgency.  Musculoskeletal:  Negative for myalgias.  Skin:  Negative for itching.  Neurological:  Negative for dizziness.   Pertinent positives and negative per HPI, all others reviewed and negative  Physical Exam   BP 130/72 (BP Location: Right Arm)   Pulse 96   Temp 98.2 F (36.8 C) (Oral)   Resp 16   Ht 5\' 10"  (1.778 m)   Wt 98.2 kg   SpO2 97%   BMI 31.06 kg/m   Patient Vitals for the past 24 hrs:  BP Temp Temp src Pulse Resp SpO2 Height Weight  01/14/22 1143 130/72 -- -- 96 -- 97 % -- --  01/14/22 1122 118/76 98.2 F (36.8 C) Oral (!) 102 16 98 % -- --  01/14/22 1116 -- -- -- -- -- -- 5\' 10"  (1.778 m) 98.2 kg    Physical Exam Vitals reviewed.  Constitutional:      General: She is not in acute distress.    Appearance: She is well-developed. She is not toxic-appearing.  HENT:     Head: Normocephalic and atraumatic.     Mouth/Throat:     Mouth: Mucous membranes are moist.  Eyes:     Extraocular Movements: Extraocular movements intact.  Cardiovascular:     Rate and Rhythm: Normal rate.  Pulmonary:     Effort: Pulmonary effort is normal. No respiratory distress.     Breath sounds: No wheezing or rales.  Abdominal:     Palpations: Abdomen is soft.  Skin:    General: Skin is warm and dry.  Neurological:     Mental Status: She is alert and oriented to person, place, and time.  Psychiatric:        Mood and Affect: Mood normal.        Behavior: Behavior normal.     FHT: 160bpm  Labs Results for orders placed or performed during the hospital encounter of 01/14/22 (from the past 24 hour(s))  Urinalysis, Routine w reflex microscopic Urine, Clean Catch     Status: Abnormal   Collection Time: 01/14/22 11:50 AM  Result Value Ref Range   Color, Urine YELLOW YELLOW   APPearance CLOUDY (A)  CLEAR   Specific Gravity, Urine 1.021 1.005 - 1.030   pH 5.0 5.0 - 8.0   Glucose, UA NEGATIVE NEGATIVE mg/dL   Hgb urine dipstick NEGATIVE NEGATIVE  Bilirubin Urine NEGATIVE NEGATIVE   Ketones, ur NEGATIVE NEGATIVE mg/dL   Protein, ur NEGATIVE NEGATIVE mg/dL   Nitrite NEGATIVE NEGATIVE   Leukocytes,Ua NEGATIVE NEGATIVE    Imaging No results found.  MAU Course  Procedures  Lab Orders         Urinalysis, Routine w reflex microscopic Urine, Clean Catch         CBC    Meds ordered this encounter  Medications   albuterol (VENTOLIN HFA) 108 (90 Base) MCG/ACT inhaler    Sig: Inhale 2 puffs into the lungs every 6 (six) hours as needed for wheezing or shortness of breath. Can also take 2 puffs at night    Dispense:  1 each    Refill:  1   fluticasone (FLOVENT HFA) 44 MCG/ACT inhaler    Sig: Inhale 2 puffs into the lungs 2 (two) times daily.    Dispense:  1 each    Refill:  2   Imaging Orders  No imaging studies ordered today    MDM moderate  Assessment and Plan   Short of breath on exertion Exercise-induced asthma Tachycardia [redacted] weeks gestation of pregnancy Anxiety  Suspect symptoms may be due to a worsening of her baseline asthma. CBC done shows no anemia.  Recommend daily flovent to help with asthma control. Continue prn albuterol for shortness or breath and chest tightness Continue medications for mental health.  #FWB: normal heart tones noted.  Dispo: discharged to home in stable condition.   Kleigh Hoelzer Sherrilyn Rist, MD/MPH 01/14/22 1:11 PM

## 2022-03-15 ENCOUNTER — Ambulatory Visit: Payer: 59 | Admitting: Physician Assistant

## 2022-04-15 ENCOUNTER — Other Ambulatory Visit: Payer: Self-pay

## 2022-04-15 ENCOUNTER — Telehealth: Payer: Self-pay | Admitting: Psychiatry

## 2022-04-15 DIAGNOSIS — F902 Attention-deficit hyperactivity disorder, combined type: Secondary | ICD-10-CM

## 2022-04-15 MED ORDER — LISDEXAMFETAMINE DIMESYLATE 30 MG PO CAPS: 30.0000 mg | ORAL_CAPSULE | Freq: Every day | ORAL | 0 refills | Status: DC

## 2022-04-15 NOTE — Telephone Encounter (Signed)
Pt lvm that her generic vyvanse is available at the [facility] on [address]. Please send script there

## 2022-04-15 NOTE — Telephone Encounter (Signed)
Pended.

## 2022-04-25 LAB — OB RESULTS CONSOLE GBS: GBS: NEGATIVE

## 2022-05-04 ENCOUNTER — Telehealth (HOSPITAL_COMMUNITY): Payer: Self-pay | Admitting: *Deleted

## 2022-05-04 ENCOUNTER — Encounter (HOSPITAL_COMMUNITY): Payer: Self-pay

## 2022-05-04 NOTE — Telephone Encounter (Signed)
Preadmission screen  

## 2022-05-05 ENCOUNTER — Encounter (HOSPITAL_COMMUNITY): Payer: Self-pay

## 2022-05-15 NOTE — H&P (Signed)
Felicia Young is a 32 y.o. female presenting for ***. OB History     Gravida  3   Para  2   Term  2   Preterm      AB      Living  2      SAB      IAB      Ectopic      Multiple  0   Live Births  2          Past Medical History:  Diagnosis Date  . ADHD   . ALLERGIC RHINITIS    seasonal  . Depression   . Dyslipidemia 01/11/2013   CPX 01/2013 dx - Recommend diet/aerobic exercise changes with weight reduction  . Exercise-induced asthma   . Generalized anxiety disorder   . Migraine   . PCOS (polycystic ovarian syndrome)    Past Surgical History:  Procedure Laterality Date  . CESAREAN SECTION N/A 10/23/2013   Procedure: Primary Cesarean Section Delivery Baby Girl @ 52 08, Apgars 7/9;  Surgeon: Logan Bores, MD;  Location: [facility];  Service: Obstetrics;  Laterality: N/A;  . CESAREAN SECTION N/A 11/18/2016   Procedure: REPEAT CESAREAN SECTION;  Surgeon: Paula Compton, MD;  Location: [facility];  Service: Obstetrics;  Laterality: N/A;  MD requests RNFA  . ORIF RADIAL FRACTURE Left 1999  . WISDOM TOOTH EXTRACTION  2011   Family History: family history includes Alcohol abuse in her maternal grandmother; Arthritis in her father and mother; Hyperlipidemia in her maternal grandfather, maternal grandmother, paternal grandfather, and paternal grandmother; Hypertension in her mother and paternal grandfather; Stroke in her maternal grandmother. Social History:  reports that she has quit smoking. Her smoking use included cigarettes. She has never used smokeless tobacco. She reports that she does not drink alcohol and does not use drugs.     Maternal Diabetes: {Maternal Diabetes:3043596} Genetic Screening: {Genetic Screening:20205} Maternal Ultrasounds/Referrals: {Maternal Ultrasounds / Referrals:20211} Fetal Ultrasounds or other Referrals:  {Fetal Ultrasounds or Other Referrals:20213} Maternal Substance Abuse:  {Maternal Substance Abuse:20223} Significant  Maternal Medications:  {Significant Maternal Meds:20233} Significant Maternal Lab Results:  {Significant Maternal Lab Results:20235} Number of Prenatal Visits:{Prenatal Visits:27860} Other Comments:  {Other Comments:20251}  Review of Systems  Constitutional:  Negative for chills and fever.  Gastrointestinal:  Negative for abdominal pain.  Genitourinary:  Negative for vaginal bleeding.   History   unknown if currently breastfeeding. Exam Physical Exam  Prenatal labs: ABO, Rh: O/Positive/-- (07/21 0000) Antibody: Negative (07/21 0000) Rubella: Immune (07/21 0000) RPR: Nonreactive (07/21 0000)  HBsAg: Negative (07/21 0000)  HIV: Non-reactive (07/21 0000)  GBS: Negative/-- (01/22 0000)   Assessment/Plan: Logan Bores 05/15/2022, 5:34 PM

## 2022-05-16 ENCOUNTER — Other Ambulatory Visit: Payer: Self-pay

## 2022-05-16 ENCOUNTER — Telehealth: Payer: Self-pay | Admitting: Psychiatry

## 2022-05-16 ENCOUNTER — Other Ambulatory Visit (HOSPITAL_COMMUNITY)
Admission: RE | Admit: 2022-05-16 | Discharge: 2022-05-16 | Disposition: A | Discharge status: Home / Self Care | Payer: 59 | Source: Ambulatory Visit | Attending: Obstetrics & Gynecology | Admitting: Obstetrics & Gynecology

## 2022-05-16 DIAGNOSIS — Z3490 Encounter for supervision of normal pregnancy, unspecified, unspecified trimester: Secondary | ICD-10-CM | POA: Insufficient documentation

## 2022-05-16 DIAGNOSIS — Z98891 History of uterine scar from previous surgery: Secondary | ICD-10-CM | POA: Insufficient documentation

## 2022-05-16 DIAGNOSIS — F902 Attention-deficit hyperactivity disorder, combined type: Secondary | ICD-10-CM

## 2022-05-16 LAB — CBC
HCT: 31.7 % — ABNORMAL LOW (ref 36.0–46.0)
Hemoglobin: 10.1 g/dL — ABNORMAL LOW (ref 12.0–15.0)
MCH: 26.9 pg (ref 26.0–34.0)
MCHC: 31.9 g/dL (ref 30.0–36.0)
MCV: 84.5 fL (ref 80.0–100.0)
Platelets: 223 10*3/uL (ref 150–400)
RBC: 3.75 MIL/uL — ABNORMAL LOW (ref 3.87–5.11)
RDW: 13.2 % (ref 11.5–15.5)
WBC: 7.4 10*3/uL (ref 4.0–10.5)
nRBC: 0 % (ref 0.0–0.2)

## 2022-05-16 LAB — TYPE AND SCREEN
ABO/RH(D): O POS
Antibody Screen: NEGATIVE

## 2022-05-16 MED ORDER — LISDEXAMFETAMINE DIMESYLATE 30 MG PO CAPS: 30.0000 mg | ORAL_CAPSULE | Freq: Every day | ORAL | 0 refills | Status: DC

## 2022-05-16 NOTE — Patient Instructions (Signed)
Felicia Young  05/16/2022   Your procedure is scheduled on:  05/17/2022  Arrive at [place] at Entrance C on [address] at [facility]  and [employer]. You are invited to use the FREE valet parking or use the Visitor's parking deck.  Pick up the phone at the desk and dial [phone].  Call this number if you have problems the morning of surgery: [phone]  Remember:   Do not eat food:(After Midnight) Desps de medianoche.  Do not drink clear liquids: (After Midnight) Desps de medianoche.  Take these medicines the morning of surgery with A SIP OF WATER:  Bring your inhaler with you.  Take zoloft and lamictal as directed.  May take vyvanse that morning if needed or may hold the dose.   Do not wear jewelry, make-up or nail polish.  Do not wear lotions, powders, or perfumes. Do not wear deodorant.  Do not shave 48 hours prior to surgery.  Do not bring valuables to the hospital.  [facility] is not   responsible for any belongings or valuables brought to the hospital.  Contacts, dentures or bridgework may not be worn into surgery.  Leave suitcase in the car. After surgery it may be brought to your room.  For patients admitted to the hospital, checkout time is 11:00 AM the day of              discharge.      Please read over the following fact sheets that you were given:     Preparing for Surgery

## 2022-05-16 NOTE — Telephone Encounter (Signed)
Pended.

## 2022-05-16 NOTE — Telephone Encounter (Signed)
Pt called at 10:10a.  She would like refill of Vyvanse sent to   [facility] ([address] [place] [id number] [place] ([place])  [address] Phone: [phone]  Fax: [phone]   Next appt 3/25

## 2022-05-17 ENCOUNTER — Encounter (HOSPITAL_COMMUNITY)
Admission: RE | Disposition: A | Discharge status: Home / Self Care | Payer: Self-pay | Source: Home / Self Care | Attending: Obstetrics and Gynecology

## 2022-05-17 ENCOUNTER — Inpatient Hospital Stay (HOSPITAL_COMMUNITY)
Admission: RE | Admit: 2022-05-17 | Discharge: 2022-05-20 | DRG: 787 | Disposition: A | Discharge status: Home / Self Care | Payer: 59 | Attending: Obstetrics and Gynecology | Admitting: Obstetrics and Gynecology

## 2022-05-17 ENCOUNTER — Inpatient Hospital Stay (HOSPITAL_COMMUNITY): Payer: 59 | Admitting: Anesthesiology

## 2022-05-17 ENCOUNTER — Encounter (HOSPITAL_COMMUNITY): Payer: Self-pay | Admitting: Obstetrics and Gynecology

## 2022-05-17 ENCOUNTER — Other Ambulatory Visit: Payer: Self-pay

## 2022-05-17 DIAGNOSIS — F32A Depression, unspecified: Secondary | ICD-10-CM | POA: Diagnosis present

## 2022-05-17 DIAGNOSIS — O9952 Diseases of the respiratory system complicating childbirth: Secondary | ICD-10-CM | POA: Diagnosis present

## 2022-05-17 DIAGNOSIS — O99344 Other mental disorders complicating childbirth: Secondary | ICD-10-CM | POA: Diagnosis present

## 2022-05-17 DIAGNOSIS — O321XX0 Maternal care for breech presentation, not applicable or unspecified: Secondary | ICD-10-CM | POA: Diagnosis present

## 2022-05-17 DIAGNOSIS — O9902 Anemia complicating childbirth: Secondary | ICD-10-CM

## 2022-05-17 DIAGNOSIS — D62 Acute posthemorrhagic anemia: Secondary | ICD-10-CM | POA: Diagnosis not present

## 2022-05-17 DIAGNOSIS — Z3A39 39 weeks gestation of pregnancy: Secondary | ICD-10-CM | POA: Diagnosis not present

## 2022-05-17 DIAGNOSIS — F909 Attention-deficit hyperactivity disorder, unspecified type: Secondary | ICD-10-CM | POA: Diagnosis present

## 2022-05-17 DIAGNOSIS — J45909 Unspecified asthma, uncomplicated: Secondary | ICD-10-CM | POA: Diagnosis present

## 2022-05-17 DIAGNOSIS — Z98891 History of uterine scar from previous surgery: Principal | ICD-10-CM

## 2022-05-17 DIAGNOSIS — E785 Hyperlipidemia, unspecified: Secondary | ICD-10-CM | POA: Diagnosis present

## 2022-05-17 DIAGNOSIS — O34211 Maternal care for low transverse scar from previous cesarean delivery: Secondary | ICD-10-CM | POA: Diagnosis present

## 2022-05-17 DIAGNOSIS — D649 Anemia, unspecified: Secondary | ICD-10-CM | POA: Diagnosis not present

## 2022-05-17 DIAGNOSIS — Z87891 Personal history of nicotine dependence: Secondary | ICD-10-CM

## 2022-05-17 DIAGNOSIS — O99214 Obesity complicating childbirth: Secondary | ICD-10-CM | POA: Diagnosis present

## 2022-05-17 LAB — RPR: RPR Ser Ql: NONREACTIVE

## 2022-05-17 SURGERY — Surgical Case
Anesthesia: Spinal | Laterality: Bilateral

## 2022-05-17 MED ORDER — ACETAMINOPHEN 500 MG PO TABS
1000.0000 mg | ORAL_TABLET | Freq: Four times a day (QID) | ORAL | Status: AC
  Administered 2022-05-17 – 2022-05-18 (×4): 1000 mg via ORAL
  Filled 2022-05-17 (×4): qty 2

## 2022-05-17 MED ORDER — ALBUTEROL SULFATE (2.5 MG/3ML) 0.083% IN NEBU: 2.5000 mg | INHALATION_SOLUTION | Freq: Four times a day (QID) | RESPIRATORY_TRACT | Status: DC | PRN

## 2022-05-17 MED ORDER — MORPHINE SULFATE (PF) 0.5 MG/ML IJ SOLN
INTRAMUSCULAR | Status: DC | PRN
  Administered 2022-05-17: .15 ug via INTRATHECAL

## 2022-05-17 MED ORDER — PHENYLEPHRINE HCL (PRESSORS) 10 MG/ML IV SOLN
INTRAVENOUS | Status: DC | PRN
  Administered 2022-05-17 (×3): 80 ug via INTRAVENOUS

## 2022-05-17 MED ORDER — DEXMEDETOMIDINE HCL IN NACL 80 MCG/20ML IV SOLN
INTRAVENOUS | Status: DC | PRN
  Administered 2022-05-17: 8 ug via INTRAVENOUS

## 2022-05-17 MED ORDER — IBUPROFEN 600 MG PO TABS
600.0000 mg | ORAL_TABLET | Freq: Four times a day (QID) | ORAL | Status: AC
  Administered 2022-05-17 – 2022-05-20 (×12): 600 mg via ORAL
  Filled 2022-05-17 (×11): qty 1

## 2022-05-17 MED ORDER — CEFAZOLIN SODIUM-DEXTROSE 2-4 GM/100ML-% IV SOLN
INTRAVENOUS | Status: AC
  Filled 2022-05-17: qty 100

## 2022-05-17 MED ORDER — PHENYLEPHRINE HCL-NACL 20-0.9 MG/250ML-% IV SOLN
INTRAVENOUS | Status: AC
  Filled 2022-05-17: qty 250

## 2022-05-17 MED ORDER — SCOPOLAMINE 1 MG/3DAYS TD PT72: 1.0000 | MEDICATED_PATCH | Freq: Once | TRANSDERMAL | Status: DC

## 2022-05-17 MED ORDER — POVIDONE-IODINE 10 % EX SWAB: 2.0000 | Freq: Once | CUTANEOUS | Status: DC

## 2022-05-17 MED ORDER — METOCLOPRAMIDE HCL 5 MG/ML IJ SOLN
INTRAMUSCULAR | Status: AC
  Filled 2022-05-17: qty 2

## 2022-05-17 MED ORDER — COCONUT OIL OIL: 1.0000 | TOPICAL_OIL | Status: DC | PRN

## 2022-05-17 MED ORDER — LACTATED RINGERS IV SOLN: INTRAVENOUS | Status: DC

## 2022-05-17 MED ORDER — CEFAZOLIN SODIUM-DEXTROSE 2-4 GM/100ML-% IV SOLN
2.0000 g | INTRAVENOUS | Status: AC
  Administered 2022-05-17: 2 g via INTRAVENOUS

## 2022-05-17 MED ORDER — TRANEXAMIC ACID-NACL 1000-0.7 MG/100ML-% IV SOLN
INTRAVENOUS | Status: DC | PRN
  Administered 2022-05-17: 1000 mg via INTRAVENOUS

## 2022-05-17 MED ORDER — BUPIVACAINE IN DEXTROSE 0.75-8.25 % IT SOLN
INTRATHECAL | Status: DC | PRN
  Administered 2022-05-17: 1.6 mL via INTRATHECAL

## 2022-05-17 MED ORDER — FENTANYL CITRATE (PF) 100 MCG/2ML IJ SOLN
INTRAMUSCULAR | Status: DC | PRN
  Administered 2022-05-17: 15 ug via INTRATHECAL

## 2022-05-17 MED ORDER — FAMOTIDINE 20 MG PO TABS
20.0000 mg | ORAL_TABLET | Freq: Once | ORAL | Status: AC
  Administered 2022-05-17: 20 mg via ORAL

## 2022-05-17 MED ORDER — KETOROLAC TROMETHAMINE 30 MG/ML IJ SOLN: 30.0000 mg | Freq: Four times a day (QID) | INTRAMUSCULAR | Status: AC | PRN

## 2022-05-17 MED ORDER — FAMOTIDINE 20 MG PO TABS
ORAL_TABLET | ORAL | Status: AC
  Filled 2022-05-17: qty 1

## 2022-05-17 MED ORDER — ONDANSETRON HCL 4 MG/2ML IJ SOLN: 4.0000 mg | Freq: Three times a day (TID) | INTRAMUSCULAR | Status: DC | PRN

## 2022-05-17 MED ORDER — DEXAMETHASONE SODIUM PHOSPHATE 4 MG/ML IJ SOLN
INTRAMUSCULAR | Status: AC
  Filled 2022-05-17: qty 2

## 2022-05-17 MED ORDER — ORAL CARE MOUTH RINSE: 15.0000 mL | Freq: Once | OROMUCOSAL | Status: DC

## 2022-05-17 MED ORDER — SOD CITRATE-CITRIC ACID 500-334 MG/5ML PO SOLN
30.0000 mL | Freq: Once | ORAL | Status: AC
  Administered 2022-05-17: 30 mL via ORAL

## 2022-05-17 MED ORDER — OXYTOCIN-SODIUM CHLORIDE 30-0.9 UT/500ML-% IV SOLN
INTRAVENOUS | Status: DC | PRN
  Administered 2022-05-17: 300 mL via INTRAVENOUS

## 2022-05-17 MED ORDER — TRANEXAMIC ACID-NACL 1000-0.7 MG/100ML-% IV SOLN
INTRAVENOUS | Status: AC
  Filled 2022-05-17: qty 100

## 2022-05-17 MED ORDER — ZOLPIDEM TARTRATE 5 MG PO TABS: 5.0000 mg | ORAL_TABLET | Freq: Every evening | ORAL | Status: DC | PRN

## 2022-05-17 MED ORDER — MORPHINE SULFATE (PF) 0.5 MG/ML IJ SOLN
INTRAMUSCULAR | Status: AC
  Filled 2022-05-17: qty 10

## 2022-05-17 MED ORDER — SIMETHICONE 80 MG PO CHEW: 80.0000 mg | CHEWABLE_TABLET | ORAL | Status: DC | PRN

## 2022-05-17 MED ORDER — PRENATAL MULTIVITAMIN CH
1.0000 | ORAL_TABLET | Freq: Every day | ORAL | Status: DC
  Administered 2022-05-18 – 2022-05-20 (×3): 1 via ORAL
  Filled 2022-05-17 (×3): qty 1

## 2022-05-17 MED ORDER — OXYTOCIN-SODIUM CHLORIDE 30-0.9 UT/500ML-% IV SOLN
INTRAVENOUS | Status: AC
  Filled 2022-05-17: qty 500

## 2022-05-17 MED ORDER — ACETAMINOPHEN 500 MG PO TABS: 1000.0000 mg | ORAL_TABLET | Freq: Four times a day (QID) | ORAL | Status: DC

## 2022-05-17 MED ORDER — MENTHOL 3 MG MT LOZG: 1.0000 | LOZENGE | OROMUCOSAL | Status: DC | PRN

## 2022-05-17 MED ORDER — SERTRALINE HCL 100 MG PO TABS
100.0000 mg | ORAL_TABLET | Freq: Every day | ORAL | Status: DC
  Administered 2022-05-18 – 2022-05-20 (×3): 100 mg via ORAL
  Filled 2022-05-17 (×3): qty 1

## 2022-05-17 MED ORDER — DIPHENHYDRAMINE HCL 25 MG PO CAPS: 25.0000 mg | ORAL_CAPSULE | ORAL | Status: DC | PRN

## 2022-05-17 MED ORDER — DIPHENHYDRAMINE HCL 25 MG PO CAPS: 25.0000 mg | ORAL_CAPSULE | Freq: Four times a day (QID) | ORAL | Status: DC | PRN

## 2022-05-17 MED ORDER — NALOXONE HCL 4 MG/10ML IJ SOLN: 1.0000 ug/kg/h | INTRAVENOUS | Status: DC | PRN

## 2022-05-17 MED ORDER — DEXAMETHASONE SODIUM PHOSPHATE 10 MG/ML IJ SOLN
INTRAMUSCULAR | Status: DC | PRN
  Administered 2022-05-17: 8 mg via INTRAVENOUS

## 2022-05-17 MED ORDER — SENNOSIDES-DOCUSATE SODIUM 8.6-50 MG PO TABS
2.0000 | ORAL_TABLET | Freq: Every day | ORAL | Status: DC
  Administered 2022-05-18 – 2022-05-20 (×3): 2 via ORAL
  Filled 2022-05-17 (×3): qty 2

## 2022-05-17 MED ORDER — SCOPOLAMINE 1 MG/3DAYS TD PT72
1.0000 | MEDICATED_PATCH | Freq: Once | TRANSDERMAL | Status: DC
  Administered 2022-05-17: 1.5 mg via TRANSDERMAL

## 2022-05-17 MED ORDER — FENTANYL CITRATE (PF) 100 MCG/2ML IJ SOLN
INTRAMUSCULAR | Status: AC
  Filled 2022-05-17: qty 2

## 2022-05-17 MED ORDER — SCOPOLAMINE 1 MG/3DAYS TD PT72
MEDICATED_PATCH | TRANSDERMAL | Status: AC
  Filled 2022-05-17: qty 1

## 2022-05-17 MED ORDER — SOD CITRATE-CITRIC ACID 500-334 MG/5ML PO SOLN
ORAL | Status: AC
  Filled 2022-05-17: qty 30

## 2022-05-17 MED ORDER — SIMETHICONE 80 MG PO CHEW
80.0000 mg | CHEWABLE_TABLET | Freq: Three times a day (TID) | ORAL | Status: DC
  Administered 2022-05-17 – 2022-05-20 (×9): 80 mg via ORAL
  Filled 2022-05-17 (×9): qty 1

## 2022-05-17 MED ORDER — WITCH HAZEL-GLYCERIN EX PADS: 1.0000 | MEDICATED_PAD | CUTANEOUS | Status: DC | PRN

## 2022-05-17 MED ORDER — OXYCODONE HCL 5 MG PO TABS: 5.0000 mg | ORAL_TABLET | ORAL | Status: DC | PRN

## 2022-05-17 MED ORDER — ONDANSETRON HCL 4 MG/2ML IJ SOLN
INTRAMUSCULAR | Status: AC
  Filled 2022-05-17: qty 2

## 2022-05-17 MED ORDER — SODIUM CHLORIDE 0.9 % IR SOLN
Status: DC | PRN
  Administered 2022-05-17: 1 via INTRAVESICAL

## 2022-05-17 MED ORDER — PHENYLEPHRINE 80 MCG/ML (10ML) SYRINGE FOR IV PUSH (FOR BLOOD PRESSURE SUPPORT)
PREFILLED_SYRINGE | INTRAVENOUS | Status: AC
  Filled 2022-05-17: qty 10

## 2022-05-17 MED ORDER — CHLORHEXIDINE GLUCONATE 0.12 % MT SOLN: 15.0000 mL | Freq: Once | OROMUCOSAL | Status: DC

## 2022-05-17 MED ORDER — DIPHENHYDRAMINE HCL 50 MG/ML IJ SOLN: 12.5000 mg | INTRAMUSCULAR | Status: DC | PRN

## 2022-05-17 MED ORDER — SODIUM CHLORIDE 0.9% FLUSH: 3.0000 mL | INTRAVENOUS | Status: DC | PRN

## 2022-05-17 MED ORDER — ACETAMINOPHEN 10 MG/ML IV SOLN
INTRAVENOUS | Status: AC
  Filled 2022-05-17: qty 100

## 2022-05-17 MED ORDER — METOCLOPRAMIDE HCL 5 MG/ML IJ SOLN
INTRAMUSCULAR | Status: DC | PRN
  Administered 2022-05-17: 10 mg via INTRAVENOUS

## 2022-05-17 MED ORDER — DIBUCAINE (PERIANAL) 1 % EX OINT: 1.0000 | TOPICAL_OINTMENT | CUTANEOUS | Status: DC | PRN

## 2022-05-17 MED ORDER — LISDEXAMFETAMINE DIMESYLATE 30 MG PO CAPS
30.0000 mg | ORAL_CAPSULE | Freq: Every day | ORAL | Status: DC
  Administered 2022-05-18 – 2022-05-20 (×3): 30 mg via ORAL
  Filled 2022-05-17 (×3): qty 1

## 2022-05-17 MED ORDER — STERILE WATER FOR IRRIGATION IR SOLN
Status: DC | PRN
  Administered 2022-05-17: 1000 mL

## 2022-05-17 MED ORDER — PHENYLEPHRINE HCL-NACL 20-0.9 MG/250ML-% IV SOLN
INTRAVENOUS | Status: DC | PRN
  Administered 2022-05-17: 60 ug/min via INTRAVENOUS

## 2022-05-17 MED ORDER — LAMOTRIGINE 150 MG PO TABS
150.0000 mg | ORAL_TABLET | Freq: Every day | ORAL | Status: DC
  Administered 2022-05-18 – 2022-05-20 (×3): 150 mg via ORAL
  Filled 2022-05-17 (×4): qty 1

## 2022-05-17 MED ORDER — OXYTOCIN-SODIUM CHLORIDE 30-0.9 UT/500ML-% IV SOLN: 2.5000 [IU]/h | INTRAVENOUS | Status: AC

## 2022-05-17 MED ORDER — NALOXONE HCL 0.4 MG/ML IJ SOLN: 0.4000 mg | INTRAMUSCULAR | Status: DC | PRN

## 2022-05-17 MED ORDER — ACETAMINOPHEN 10 MG/ML IV SOLN
INTRAVENOUS | Status: DC | PRN
  Administered 2022-05-17: 1000 mg via INTRAVENOUS

## 2022-05-17 MED ORDER — ONDANSETRON HCL 4 MG/2ML IJ SOLN
INTRAMUSCULAR | Status: DC | PRN
  Administered 2022-05-17: 4 mg via INTRAVENOUS

## 2022-05-17 SURGICAL SUPPLY — 33 items
BENZOIN TINCTURE PRP APPL 2/3 (GAUZE/BANDAGES/DRESSINGS) IMPLANT
CHLORAPREP W/TINT 26 (MISCELLANEOUS) ×2 IMPLANT
CLAMP UMBILICAL CORD (MISCELLANEOUS) ×1 IMPLANT
CLOTH BEACON ORANGE TIMEOUT ST (SAFETY) ×1 IMPLANT
DRSG OPSITE POSTOP 4X10 (GAUZE/BANDAGES/DRESSINGS) ×1 IMPLANT
ELECT REM PT RETURN 9FT ADLT (ELECTROSURGICAL) ×1
ELECTRODE REM PT RTRN 9FT ADLT (ELECTROSURGICAL) ×1 IMPLANT
EXTRACTOR VACUUM KIWI (MISCELLANEOUS) IMPLANT
GLOVE BIO SURGEON STRL SZ 6.5 (GLOVE) ×1 IMPLANT
GLOVE BIOGEL PI IND STRL 7.0 (GLOVE) ×1 IMPLANT
GOWN STRL REUS W/TWL LRG LVL3 (GOWN DISPOSABLE) ×2 IMPLANT
KIT ABG SYR 3ML LUER SLIP (SYRINGE) IMPLANT
NDL HYPO 25X5/8 SAFETYGLIDE (NEEDLE) IMPLANT
NEEDLE HYPO 25X5/8 SAFETYGLIDE (NEEDLE) IMPLANT
NS IRRIG 1000ML POUR BTL (IV SOLUTION) ×1 IMPLANT
PACK C SECTION WH (CUSTOM PROCEDURE TRAY) ×1 IMPLANT
PAD OB MATERNITY 4.3X12.25 (PERSONAL CARE ITEMS) ×1 IMPLANT
PENCIL SMOKE EVAC W/HOLSTER (ELECTROSURGICAL) ×1 IMPLANT
RTRCTR C-SECT PINK 25CM LRG (MISCELLANEOUS) ×1 IMPLANT
STRIP CLOSURE SKIN 1/2X4 (GAUZE/BANDAGES/DRESSINGS) IMPLANT
SUT CHROMIC 1 CTX 36 (SUTURE) ×2 IMPLANT
SUT PLAIN 0 NONE (SUTURE) IMPLANT
SUT PLAIN 2 0 XLH (SUTURE) ×1 IMPLANT
SUT VIC AB 0 CT1 27 (SUTURE) ×2
SUT VIC AB 0 CT1 27XBRD ANBCTR (SUTURE) ×2 IMPLANT
SUT VIC AB 2-0 CT1 27 (SUTURE) ×1
SUT VIC AB 2-0 CT1 TAPERPNT 27 (SUTURE) ×1 IMPLANT
SUT VIC AB 3-0 CT1 27 (SUTURE) ×1
SUT VIC AB 3-0 CT1 TAPERPNT 27 (SUTURE) ×1 IMPLANT
SUT VIC AB 4-0 KS 27 (SUTURE) ×1 IMPLANT
TOWEL OR 17X24 6PK STRL BLUE (TOWEL DISPOSABLE) ×1 IMPLANT
TRAY FOLEY W/BAG SLVR 14FR LF (SET/KITS/TRAYS/PACK) ×1 IMPLANT
WATER STERILE IRR 1000ML POUR (IV SOLUTION) ×1 IMPLANT

## 2022-05-17 NOTE — Interval H&P Note (Signed)
History and Physical Interval Note:  05/17/2022 7:09 AM  Felicia Young  has presented today for surgery, with the diagnosis of repeat c-section and sterilization.  The various methods of treatment have been discussed with the patient and family. After consideration of risks, benefits and other options for treatment, the patient has consented to  repeat c-section as a surgical intervention.  She declines a tubal ligation.  The patient's history has been reviewed, patient examined, no change in status, stable for surgery.  I have reviewed the patient's chart and labs.  Questions were answered to the patient's satisfaction.     Logan Bores

## 2022-05-17 NOTE — Anesthesia Postprocedure Evaluation (Signed)
Anesthesia Post Note  Patient: Felicia Young  Procedure(s) Performed: CESAREAN SECTION (Bilateral)     Patient location during evaluation: PACU Anesthesia Type: Spinal Level of consciousness: awake, awake and alert and oriented Pain management: pain level controlled Vital Signs Assessment: post-procedure vital signs reviewed and stable Respiratory status: spontaneous breathing, nonlabored ventilation and respiratory function stable Cardiovascular status: blood pressure returned to baseline and stable Postop Assessment: no headache, no backache, spinal receding and no apparent nausea or vomiting Anesthetic complications: no   No notable events documented.  Last Vitals:  Vitals:   05/17/22 1015 05/17/22 1031  BP: 126/85 129/74  Pulse: 77 70  Resp: 15   Temp:  36.7 C  SpO2: 99% 99%    Last Pain:  Vitals:   05/17/22 1031  TempSrc: Oral  PainSc:    Pain Goal:                Epidural/Spinal Function Cutaneous sensation: Able to Discern Pressure (05/17/22 1015), Patient able to flex knees: Yes (05/17/22 1015), Patient able to lift hips off bed: Yes (05/17/22 1015), Back pain beyond tenderness at insertion site: No (05/17/22 1015), Progressively worsening motor and/or sensory loss: No (05/17/22 1015), Bowel and/or bladder incontinence post epidural: No (05/17/22 1015)  Santa Lighter

## 2022-05-17 NOTE — Anesthesia Procedure Notes (Signed)
Spinal  Patient location during procedure: OR Start time: 05/17/2022 7:33 AM End time: 05/17/2022 7:36 AM Reason for block: surgical anesthesia Staffing Performed: anesthesiologist  Anesthesiologist: Santa Lighter, MD Performed by: Santa Lighter, MD Authorized by: Santa Lighter, MD   Preanesthetic Checklist Completed: patient identified, IV checked, risks and benefits discussed, surgical consent, monitors and equipment checked, pre-op evaluation and timeout performed Spinal Block Patient position: sitting Prep: DuraPrep and site prepped and draped Patient monitoring: continuous pulse ox and blood pressure Approach: midline Location: L3-4 Injection technique: single-shot Needle Needle type: Pencan  Needle gauge: 24 G Assessment Sensory level: T6 Events: CSF return Additional Notes Functioning IV was confirmed and monitors were applied. Sterile prep and drape, including hand hygiene, mask and sterile gloves were used. The patient was positioned and the spine was prepped. The skin was anesthetized with lidocaine.  Free flow of clear CSF was obtained prior to injecting local anesthetic into the CSF.  The spinal needle aspirated freely following injection.  The needle was carefully withdrawn.  The patient tolerated the procedure well. Consent was obtained prior to procedure with all questions answered and concerns addressed. Risks including but not limited to bleeding, infection, nerve damage, paralysis, failed block, inadequate analgesia, allergic reaction, high spinal, itching and headache were discussed and the patient wished to proceed.   Hoy Morn, MD

## 2022-05-17 NOTE — Anesthesia Preprocedure Evaluation (Signed)
Anesthesia Evaluation  Patient identified by MRN, date of birth, ID band Patient awake    Reviewed: Allergy & Precautions, NPO status , Patient's Chart, lab work & pertinent test results  Airway Mallampati: III  TM Distance: >3 FB Neck ROM: Full    Dental  (+) Teeth Intact, Dental Advisory Given   Pulmonary asthma , former smoker   Pulmonary exam normal breath sounds clear to auscultation       Cardiovascular negative cardio ROS Normal cardiovascular exam Rhythm:Regular Rate:Normal     Neuro/Psych  Headaches PSYCHIATRIC DISORDERS Anxiety Depression       GI/Hepatic negative GI ROS, Neg liver ROS,,,  Endo/Other  Obesity   Renal/GU negative Renal ROS     Musculoskeletal negative musculoskeletal ROS (+)    Abdominal   Peds  (+) ADHD Hematology  (+) Blood dyscrasia, anemia Plt 223k   Anesthesia Other Findings Day of surgery medications reviewed with the patient.  Reproductive/Obstetrics (+) Pregnancy                             Anesthesia Physical Anesthesia Plan  ASA: 2  Anesthesia Plan: Spinal   Post-op Pain Management: Minimal or no pain anticipated   Induction: Intravenous  PONV Risk Score and Plan: 2 and Dexamethasone, Ondansetron and Scopolamine patch - Pre-op  Airway Management Planned: Natural Airway  Additional Equipment:   Intra-op Plan:   Post-operative Plan:   Informed Consent: I have reviewed the patients History and Physical, chart, labs and discussed the procedure including the risks, benefits and alternatives for the proposed anesthesia with the patient or authorized representative who has indicated his/her understanding and acceptance.     Dental advisory given  Plan Discussed with: CRNA, Anesthesiologist and Surgeon  Anesthesia Plan Comments: (Discussed risks and benefits of and differences between spinal and general. Discussed risks of spinal including  headache, backache, failure, bleeding, infection, and nerve damage. Patient consents to spinal. Questions answered. Coagulation studies and platelet count acceptable.)       Anesthesia Quick Evaluation

## 2022-05-17 NOTE — Transfer of Care (Signed)
Immediate Anesthesia Transfer of Care Note  Patient: Felicia Young  Procedure(s) Performed: CESAREAN SECTION (Bilateral)  Patient Location: PACU  Anesthesia Type:Spinal  Level of Consciousness: awake, alert , and oriented  Airway & Oxygen Therapy: Patient Spontanous Breathing  Post-op Assessment: Report given to RN and Post -op Vital signs reviewed and stable  Post vital signs: Reviewed and stable  Last Vitals:  Vitals Value Taken Time  BP    Temp    Pulse    Resp    SpO2      Last Pain:  Vitals:   05/17/22 0618  TempSrc: Oral  PainSc: 0-No pain         Complications: No notable events documented.

## 2022-05-17 NOTE — Op Note (Signed)
Operative Note An experienced assistant was required given the standard of surgical care given the complexity of the case.  This assistant was needed for exposure, dissection, suctioning, retraction, instrument exchange, assisting with delivery with administration of fundal pressure, and for overall help during the procedure.    Preoperative Diagnosis Term pregnancy at 45 0/7 weeks Prior c-section x 2  Postoperative Diagnosis same  Procedure Repeat low transverse c-section with two layer closure of uterus  Surgeon Paula Compton, MD Gifford Shave, MD  Anesthesia Spinal  Fluids: EBL 651m UOP 252mIVF 200065mFindings A viable female infant in the vertex presentation  Apgars pending per NICU Weight 8#12oz  Normal uterus ovaries and tubes.  Baby had increased work of breathing so was taken to NICU for observation and CPAP.  Specimen Placenta to L&D  Procedure Note Patient was taken to the operating room where spinal anesthesia was obtained and found to be adequate by Allis clamp test. She was prepped and draped in the normal sterile fashion in the dorsal supine position with a leftward tilt. An appropriate time out was performed. A Pfannenstiel skin incision was then made through a pre-existing scar with the scalpel and carried through to the underlying layer of fascia by sharp dissection and Bovie cautery. The fascia was nicked in the midline and the incision was extended laterally with Mayo scissors. The inferior aspect of the incision was grasped Coker clamps and dissected off the underlying rectus muscles. In a similar fashion the superior aspect was dissected off the rectus muscles. Rectus muscles were separated in the midline and the peritoneal cavity entered bluntly. The peritoneal incision was then extended both superiorly and inferiorly with careful attention to avoid both bowel and bladder. The Alexis self-retaining wound retractor was then placed within the incision and  the lower uterine segment exposed. The bladder flap was developed with Metzenbaum scissors and pushed away from the lower uterine segment. The lower uterine segment was then incised in a transverse fashion and the cavity itself entered bluntly. The incision was extended bluntly. The infant's head was then lifted and delivered from the incision without difficulty. The remainder of the infant delivered and the nose and mouth bulb suctioned with the cord clamped and cut as well after one minute delay.  The baby had good tone and and spontaneous cry. The infant was handed off to the waiting pediatricians and did ultimately need CPAP for increased work of breathing. The placenta was then spontaneously expressed from the uterus and the uterus cleared of all clots and debris with moist lap sponge. The uterine incision was then repaired in 2 layers the first layer was a running locked layer of 1-0 chromic and the second an imbricating layer of the same suture. The tubes and ovaries were inspected and the gutters cleared of all clots and debris. The uterine incision was inspected and found to be hemostatic. All instruments and sponges as well as the Alexis retractor were then removed from the abdomen. The rectus muscles and peritoneum were then reapproximated with a running suture of 2-0 Vicryl. The fascia was then closed with 0 Vicryl in a running fashion. Subcutaneous tissue was reapproximated with 3-0 plain in a running fashion. The skin was closed with a subcuticular stitch of 4-0 Vicryl on a Keith needle and then reinforced with benzoin and Steri-Strips. At the conclusion of the procedure all instruments and sponge counts were correct. Patient was taken to the recovery room in good condition.  The baby was taken  to NICU for O2 support.    The patient does want the baby circumcised when medically stable.

## 2022-05-17 NOTE — Lactation Note (Signed)
This note was copied from a baby's chart.  NICU Lactation Consultation Note  Patient Name: Boy Eshanti Moscone [id number] Date: 05/17/2022 Age:33 hours   Subjective Reason for consult: Initial assessment; 1st time breastfeeding; NICU baby; Term  LC in to visit with P3 Mom of term baby delivered by C/Section and receiving respiratory support in the NICU.  Baby is currently NPO and on CPAP.  LC assisted Mom to initiate double pumping using the 21 mm flanges.  Mom brought her Spectra pump from home, but recommended using the Medela Symphony initiation setting while here in the hospital.    Encouraged pumping every 2-3 hrs when awaken, adding breast massage and hand expression to initiate milk supply.    Encouraged STS when able to in NICU.   Mom is aware of IP and OP lactation support available to her and encouraged to ask for help prn.  Objective Infant data: Mother's Current Feeding Choice: Breast Milk and Formula  Infant feeding assessment   Maternal data: G3P2002  C-Section, Low Transverse Significant Breast History:: little breast changes during pregnancy,  Current breast feeding challenges:: Infant in NICU for respiratory support  Previous breastfeeding challenges?: Low milk supply; Exclusive pump and bottle fed; Lack of support  Does the patient have breastfeeding experience prior to this delivery?: Yes How long did the patient breastfeed?: 1 week  Pumping frequency: Encouraged to pump every 2-3 hrs when awake Flange Size: 21  Risk factor for low milk supply:: right breast much smaller in size than left, infant in NICU   WIC Program: No WIC Referral Sent?: No Pump: DEBP, Personal (Spectra DEBP)  Assessment Infant: No data recorded Feeding Status: NPO   Maternal: No data recorded  Intervention/Plan Interventions: Breast feeding basics reviewed; Skin to skin; Breast massage; Hand express; DEBP; LC Services brochure  Tools: Pump; Flanges Pump Education: Setup,  frequency, and cleaning; Milk Storage  Plan: Consult Status: NICU follow-up  NICU Follow-up type: New admission follow up    Broadus John 05/17/2022, 12:45 PM

## 2022-05-17 NOTE — H&P (Addendum)
**Corrected H&P-baby was not breech and was in first H&P in error**    Felicia Young is a 32 y.o. female G3P2002 at 74 weeks ( EDD 05/24/22 by 8 week [place], irregular menses)  presenting for scheduled repeat c-section.    Prenatal care significant for: 1) Depressive disorder  stable on lamictal and zoloft 2) Attention deficit hyperactivity disorder  on vyvanse 3) History of cesarean section  Prior LTCS x 2 4)  Repeat c-section at 39 weeks Declines tubal 5) Social problem  Separated from husband, good support from parents 59) Asthma  inhaler prn   OB History     Gravida  3   Para  2   Term  2   Preterm      AB      Living  2      SAB      IAB      Ectopic      Multiple  0   Live Births  2         10-23-2013, 41 wks  F, 8lbs 11oz C-section  11-18-2016, 39.1 wks M, 8lbs 13oz, Cesarean Section  Past Medical History:  Diagnosis Date   ADHD    ALLERGIC RHINITIS    seasonal   Depression    Dyslipidemia 01/11/2013   CPX 01/2013 dx - Recommend diet/aerobic exercise changes with weight reduction   Exercise-induced asthma    Generalized anxiety disorder    Migraine    PCOS (polycystic ovarian syndrome)    Past Surgical History:  Procedure Laterality Date   CESAREAN SECTION N/A 10/23/2013   Procedure: Primary Cesarean Section Delivery Baby Girl @ 67 08, Apgars 7/9;  Surgeon: Logan Bores, MD;  Location: [facility];  Service: Obstetrics;  Laterality: N/A;   CESAREAN SECTION N/A 11/18/2016   Procedure: REPEAT CESAREAN SECTION;  Surgeon: Paula Compton, MD;  Location: [facility];  Service: Obstetrics;  Laterality: N/A;  MD requests RNFA   ORIF RADIAL FRACTURE Left 1999   WISDOM TOOTH EXTRACTION  2011   Family History: family history includes Alcohol abuse in her maternal grandmother; Arthritis in her father and mother; Hyperlipidemia in her maternal grandfather, maternal grandmother, paternal grandfather, and paternal grandmother; Hypertension in  her mother and paternal grandfather; Stroke in her maternal grandmother. Social History:  reports that she has quit smoking. Her smoking use included cigarettes. She has never used smokeless tobacco. She reports that she does not drink alcohol and does not use drugs.     Maternal Diabetes: No Genetic Screening: Normal Maternal Ultrasounds/Referrals: Normal Fetal Ultrasounds or other Referrals:  None Maternal Substance Abuse:  No Significant Maternal Medications:  Meds include: Zoloft Other: lamictal, vyvanse Significant Maternal Lab Results:  Group B Strep negative Number of Prenatal Visits:greater than 3 verified prenatal visits Other Comments:  None  Review of Systems  Constitutional:  Negative for chills and fever.  Gastrointestinal:  Negative for abdominal pain.  Genitourinary:  Negative for vaginal bleeding.   Maternal Medical History:  Contractions: Frequency: irregular.   Perceived severity is mild.   Fetal activity: Perceived fetal activity is normal.   Prenatal Complications - Diabetes: none.     Blood pressure 128/88, pulse 98, temperature 98.5 F (36.9 C), temperature source Oral, resp. rate 18, height 5' 10"$  (1.778 m), weight 102.2 kg, SpO2 98 %, unknown if currently breastfeeding. Maternal Exam:  Uterine Assessment: Contraction strength is mild.  Contraction frequency is irregular.  Abdomen: Patient reports no abdominal tenderness. Surgical scars: low transverse.  Fetal presentation: vertex Introitus: Normal vulva. Normal vagina.    Physical Exam Constitutional:      Appearance: Normal appearance.  Cardiovascular:     Rate and Rhythm: Normal rate and regular rhythm.  Pulmonary:     Effort: Pulmonary effort is normal.     Breath sounds: Normal breath sounds.  Abdominal:     Palpations: Abdomen is soft.     Comments: Pfannenstiel scar  Genitourinary:    General: Normal vulva.  Neurological:     General: No focal deficit present.     Mental Status: She is  alert.  Psychiatric:        Mood and Affect: Mood normal.        Behavior: Behavior normal.     Prenatal labs: ABO, Rh: --/--/O POS (02/12 [id number]) Antibody: NEG (02/12 0917) Rubella: Immune (07/21 0000) RPR: NON REACTIVE (02/12 0914)  HBsAg: Negative (07/21 0000)  HIV: Non-reactive (07/21 0000)  GBS: Negative/-- (01/22 0000)  One hour GCT 98 NIPT low risk Hgb AA Essential panel negative in prior pregnancy  Assessment/Plan: Patient presents for repeat scheduled c-section   Risks of bleeding, infection and possible damage to bowel and bladder reviewed with patient.  She would accept a blood transfusion if needed and is a full code.  She has decided against a tubal sterilization. SHe is ready to proceed.    Logan Bores 05/17/2022, 8:51 AM

## 2022-05-18 ENCOUNTER — Encounter (HOSPITAL_COMMUNITY): Payer: Self-pay | Admitting: Obstetrics and Gynecology

## 2022-05-18 LAB — CBC
HCT: 24.6 % — ABNORMAL LOW (ref 36.0–46.0)
Hemoglobin: 8 g/dL — ABNORMAL LOW (ref 12.0–15.0)
MCH: 27.3 pg (ref 26.0–34.0)
MCHC: 32.5 g/dL (ref 30.0–36.0)
MCV: 84 fL (ref 80.0–100.0)
Platelets: 184 10*3/uL (ref 150–400)
RBC: 2.93 MIL/uL — ABNORMAL LOW (ref 3.87–5.11)
RDW: 12.9 % (ref 11.5–15.5)
WBC: 12.2 10*3/uL — ABNORMAL HIGH (ref 4.0–10.5)
nRBC: 0 % (ref 0.0–0.2)

## 2022-05-18 LAB — BIRTH TISSUE RECOVERY COLLECTION (PLACENTA DONATION)

## 2022-05-18 NOTE — Lactation Note (Signed)
This note was copied from a baby's chart. Lactation Consultation Note  Patient Name: Felicia Young [id number] Date: 05/18/2022 Reason for consult: Follow-up assessment;1st time breastfeeding;Term Age:33 hours  Visited with family of 3 hours old FT female; baby was in the NICU earlier today but he was transferred back to the [facility] this afternoon. Ms. Landry is a P3 but this is her firs time breastfeeding. She has been pumping and also started putting baby to breast this afternoon after leaving the NICU. She politely declined latch assistance at this time as baby was asleep and just fed an hour ago. Asked her to call for assistance when needed; her plan is to do both, breast and formula; including pumping and bottle feeding. She voiced that one of the attachment pieces for her Spectra pump broke during transit but that she already ordered a replacement. Reviewed feeding cues, pumping schedule, lactogenesis II, size of baby's stomach and anticipatory guidelines.  Feeding Mother's Current Feeding Choice: Breast Milk and Formula  Lactation Tools Discussed/Used Tools: Pump;Flanges Flange Size: 21 Breast pump type: Double-Electric Breast Pump Pump Education: Setup, frequency, and cleaning;Milk Storage Reason for Pumping: NICU Pumping frequency: 4-5 times/24 hours Pumped volume:  (droplets)  Interventions Interventions: Breast feeding basics reviewed;DEBP;Education  Plan of care Encouraged to continue putting baby to breast at least 8 times/24 hours or soone if feeding cues are present She'll supplement with Similac formula per feeding choice on admission after feedings at the breast if baby still appear hungry She'll pump whenever baby is getting a bottle to protect her supply  GOB present and supportive. All questions and concerns answered, family to contact [facility] services PRN.  Consult Status Consult Status: Follow-up Date: 05/19/22 Follow-up type: In-patient   Maecie Sevcik Francene Boyers 05/18/2022,  7:02 PM

## 2022-05-18 NOTE — Progress Notes (Signed)
Patient is doing well.  She is tolerating PO, ambulating, voiding.  Pain is controlled.  Lochia is appropriate. Baby just transitioned back from NICU (respiratory support)   Vitals:   05/17/22 1229 05/17/22 1800 05/17/22 2115 05/18/22 0544  BP: 128/77 124/74 135/70 127/67  Pulse: 83  68 77  Resp: 20 16 18 16  $ Temp: 97.8 F (36.6 C) 98 F (36.7 C) 98.2 F (36.8 C) 98.1 F (36.7 C)  TempSrc: Oral Oral Oral Oral  SpO2: 99% 100% 100% 100%  Weight:      Height:        NAD Abdomen:  soft, appropriate tenderness, incisions intact and without erythema or drainage ext:    Symmetric, trace edema bilaterally  Lab Results  Component Value Date   WBC 12.2 (H) 05/18/2022   HGB 8.0 (L) 05/18/2022   HCT 24.6 (L) 05/18/2022   MCV 84.0 05/18/2022   PLT 184 05/18/2022    --/--/O POS (02/12 [id number])  A/P    32 y.o. [id number] POD #1 s/p RCS Routine post op and postpartum care.   Anemia--abla on chronic anemia of pregnancy--hgb 10-->8.  Plan for iron at discharge Desires circumcision--just transitioned back from NICU this AM (TTN).  Plan for circ tomorrow

## 2022-05-19 NOTE — Progress Notes (Signed)
Subjective: Postpartum Day 2: Cesarean Delivery Patient reports incisional pain, tolerating PO, + flatus, and no problems voiding.  She denies HA, CP, SOB or fever. She is bonding well with baby. Feels better now baby out of NICU. Would like to stay till tomorrow. Still desires circumcision prior to discharge    Objective: Vital signs in last 24 hours: Temp:  [97.7 F (36.5 C)-98 F (36.7 C)] 98 F (36.7 C) (02/15 0622) Pulse Rate:  [84-100] 100 (02/14 2156) Resp:  [16-18] 18 (02/15 0622) BP: (129-132)/(76-85) 132/80 (02/15 0622) SpO2:  [98 %-100 %] 100 % (02/15 0622)  Physical Exam:  General: alert, cooperative, and no distress Lochia: appropriate Uterine Fundus: firm Incision: healing well DVT Evaluation: No evidence of DVT seen on physical exam.  Recent Labs    05/18/22 0640  HGB 8.0*  HCT 24.6*    Assessment/Plan: Status post Cesarean section. Doing well postoperatively. Routine care -Continue current care- pain meds due  -Pt understands that neonatal circumcision is not considered medically necessary and is elective. The risks include, but are not limited to bleeding, infection, damage to the penis, development of scar tissue, and having to have it redone at a later date. Pt understands theses risks and wishes to proceed .  Isaiah Serge, DO 05/19/2022, 11:07 AM

## 2022-05-20 MED ORDER — ACETAMINOPHEN 325 MG PO TABS
650.0000 mg | ORAL_TABLET | Freq: Four times a day (QID) | ORAL | Status: DC
  Administered 2022-05-20: 650 mg via ORAL
  Filled 2022-05-20: qty 2

## 2022-05-20 MED ORDER — IBUPROFEN 600 MG PO TABS
600.0000 mg | ORAL_TABLET | Freq: Four times a day (QID) | ORAL | Status: DC
  Filled 2022-05-20: qty 1

## 2022-05-20 MED ORDER — ACETAMINOPHEN 500 MG PO TABS
1000.0000 mg | ORAL_TABLET | Freq: Once | ORAL | Status: AC
  Administered 2022-05-20: 1000 mg via ORAL
  Filled 2022-05-20: qty 2

## 2022-05-20 NOTE — Lactation Note (Signed)
This note was copied from a baby's chart. Lactation Consultation Note  Patient Name: Boy Sakoya Hinkel [id number] Date: 05/20/2022   Age:33 hours Attempted to see mom again and she was sleeping. Everyone in rm. Was sleeping. Maternal Data    Feeding    LATCH Score                    Lactation Tools Discussed/Used    Interventions    Discharge    Consult Status      Theodoro Kalata 05/20/2022, 3:15 AM

## 2022-05-20 NOTE — Progress Notes (Signed)
Patient has Pain Score of 7 but does not want to take oxycotin.  Is currently on ibuprofen.  Tylenol was discontinued, patient would like to try restarting the tylenol in combination with the ibuprofen.  Patient BP is also slightly elevated.  Called Dr Terri Piedra via cell phone number listed on Am[facility] at [place], left message (no answer).  Called Dr Terri Piedra via [facility], left message (no answer) at [place].

## 2022-05-20 NOTE — Progress Notes (Signed)
[name] was referred for history of depression/anxiety. * Referral screened out by Clinical Social Worker because none of the following criteria appear to apply: ~ History of anxiety/depression during this pregnancy, or of post-partum depression following prior delivery. ~ Diagnosis of anxiety and/or depression within last 3 years OR * [name]'s symptoms currently being treated with medication and/or therapy. Per chart review, [name] is currently prescribed/taking Lamictal and Zoloft.   Please contact the Clinical Social Worker if needs arise, by [facility] request, or if [name] scores greater than 9/yes to question 10 on Edinburgh Postpartum Depression Screen.  Abundio Miu, [place] Worker [facility] Cell#: [phone]

## 2022-05-20 NOTE — Lactation Note (Signed)
This note was copied from a baby's chart. Lactation Consultation Note  Patient Name: Felicia Young [id number] Date: 05/20/2022 Reason for consult: Follow-up assessment;Term;Breastfeeding assistance;1st time breastfeeding;Infant weight loss (8.54% WL) Age:33 hours  LC entered the room and the infant was being examined by the NP Griffin Dakin.  The birth parent stated that the infant has been doing well with breastfeeding.  She said that the infant fed a lot last night and seems to be figuring things out with breastfeeding.  LC encouraged the birth parent to continue putting the infant to the breast and supplementing due to weight loss.  The birth parent knows to continue to supplement until the pediatrician tells her that it is ok to discontinue.  The birth parent was also given a plan to reduce the pumping sessions after the pediatrician tells her that she can stop supplementing.  Agra answered questions about pumping, increasing supply, returning to work, and engorgement.  LC reviewed breast care, warning signs, mastitis, infant I/O, and outpatient services.  LC spoke with the birth parent about supply and demand and stimulation.  All questions were answered.   Infant Feeding Plan:  Breastfeed 8+ times in 24 hours according to feeding cues.  Put the infant to the breast prior to supplementing.  Supplement according to feeding cues.  Pump and feed expressed milk to the infant via a bottle.  Watch infant output and call the pediatrician with questions or concerns.  Call the outpatient [facility] for assistance with breastfeeding.    Feeding Mother's Current Feeding Choice: Breast Milk and Formula  LATCH Score Latch: Grasps breast easily, tongue down, lips flanged, rhythmical sucking.  Audible Swallowing: A few with stimulation  Type of Nipple: Everted at rest and after stimulation  Comfort (Breast/Nipple): Soft / non-tender  Hold (Positioning): No assistance needed to correctly position  infant at breast.  LATCH Score: 9   Lactation Tools Discussed/Used    Interventions Interventions: Breast feeding basics reviewed;Education;LC Services brochure  Discharge Discharge Education: Engorgement and breast care;Warning signs for feeding baby;Outpatient recommendation  Consult Status Consult Status: Follow-up Date: 05/21/22 Follow-up type: In-patient    Felicia Young 05/20/2022, 9:09 AM

## 2022-05-20 NOTE — Progress Notes (Signed)
Talked to Dr Terri Piedra.  Explained that patient would like Tylenol restarted because she is not wanting to take oxycotin.  (Pain score of 7 with ibuprofen only).  Dr Terri Piedra said to add a one time dose of 1068m Tylenol PO.

## 2022-05-20 NOTE — Discharge Summary (Signed)
Postpartum Discharge Summary    Patient Name: Felicia Young DOB: May 20, 1990 MRN: [id number]  Date of admission: 05/17/2022 Delivery date:05/17/2022  Delivering provider: Paula Compton  Date of discharge: 05/20/2022  Admitting diagnosis: S/P repeat low transverse C-section [Z98.891] Intrauterine pregnancy: [redacted]w[redacted]d    Secondary diagnosis:  Principal Problem:   S/P repeat low transverse C-section  Additional problems: depression, ADHD    Discharge diagnosis: Term Pregnancy Delivered and Anemia                                              Post partum procedures: none Augmentation: N/A Complications: None  Hospital course: Sceduled C/S   32y.o. yo [id number] at 344w0das admitted to the hospital 05/17/2022 for scheduled cesarean section with the following indication:Elective Repeat.Delivery details are as follows:  Membrane Rupture Time/Date: 8:04 AM ,05/17/2022   Delivery Method:C-Section, Low Transverse  Details of operation can be found in separate operative note.  Patient had a postpartum course complicated by acute blood loss anemia with Hb of 8.0.  She is ambulating, tolerating a regular diet, passing flatus, and urinating well. Patient is discharged home in stable condition on  05/20/22        Newborn Data: Birth date:05/17/2022  Birth time:8:04 AM  Gender:Female  Living status:Living  Apgars:8 ,9  Weight:3980 g     Magnesium Sulfate received: No BMZ received: No Rhophylac:N/A Transfusion:No  Physical exam  Vitals:   05/19/22 2146 05/20/22 0001 05/20/22 0500 05/20/22 0750  BP: 130/86 (!) 140/84 (!) 140/85 139/82  Pulse: 94 92 94   Resp: 18 18 16   $ Temp: 98.1 F (36.7 C)     TempSrc: Oral  Oral   SpO2: 100%     Weight:      Height:       General: alert, cooperative, and no distress Lochia: appropriate Uterine Fundus: firm Incision: Healing well with no significant drainage, No significant erythema DVT Evaluation: No evidence of DVT seen on physical  exam. Labs: Lab Results  Component Value Date   WBC 12.2 (H) 05/18/2022   HGB 8.0 (L) 05/18/2022   HCT 24.6 (L) 05/18/2022   MCV 84.0 05/18/2022   PLT 184 05/18/2022      Latest Ref Rng & Units 01/21/2015   10:51 AM  CMP  Glucose 70 - 99 mg/dL 82   BUN 6 - 23 mg/dL 13   Creatinine 0.40 - 1.20 mg/dL 0.64   Sodium 135 - 145 mEq/L 138   Potassium 3.5 - 5.1 mEq/L 4.4   Chloride 96 - 112 mEq/L 105   CO2 19 - 32 mEq/L 25   Calcium 8.4 - 10.5 mg/dL 9.6   Total Protein 6.0 - 8.3 g/dL 7.6   Total Bilirubin 0.2 - 1.2 mg/dL 0.4   Alkaline Phos 39 - 117 U/L 42   AST 0 - 37 U/L 12   ALT 0 - 35 U/L 15    Edinburgh Score:    05/17/2022    5:14 PM  Edinburgh Postnatal Depression Scale Screening Tool  I have been able to laugh and see the funny side of things. 0  I have looked forward with enjoyment to things. 0  I have blamed myself unnecessarily when things went wrong. 2  I have been anxious or worried for no good reason. 2  I have felt scared or  panicky for no good reason. 1  Things have been getting on top of me. 1  I have been so unhappy that I have had difficulty sleeping. 0  I have felt sad or miserable. 1  I have been so unhappy that I have been crying. 0  The thought of harming myself has occurred to me. 0  Edinburgh Postnatal Depression Scale Total 7      After visit meds: Pt declined Rx for narcotic Allergies as of 05/20/2022   No Known Allergies      Medication List     STOP taking these medications    albuterol 108 (90 Base) MCG/ACT inhaler Commonly known as: VENTOLIN HFA   calcium carbonate 500 MG chewable tablet Commonly known as: TUMS - dosed in mg elemental calcium   fluticasone 44 MCG/ACT inhaler Commonly known as: FLOVENT HFA   lamoTRIgine 150 MG tablet Commonly known as: LAMICTAL   lisdexamfetamine 30 MG capsule Commonly known as: Vyvanse   Prenatal Gummies 0.18-25 MG Chew   sertraline 100 MG tablet Commonly known as: ZOLOFT                Discharge Care Instructions  (From admission, onward)           Start     Ordered   05/20/22 0000  Discharge wound care:       Comments: Can remove dressing and steri strips 5 days from date of surgery   05/20/22 1314             Discharge home in stable condition Infant Disposition:home with mother Discharge instruction: per After Visit Summary and Postpartum booklet. Activity: Advance as tolerated. Pelvic rest for 6 weeks.  Diet: routine diet Anticipated Birth Control: Unsure Postpartum Appointment: call office for BP check and incision check Additional Postpartum F/U: Postpartum Depression checkup and Incision check as above Future Appointments: Future Appointments  Date Time Provider [place]  06/27/2022 10:00 AM Cottle, Billey Co., MD CP-CP None   Follow up Visit:  [place], [facility]. Call.   Why: Call office to schedule post op visit/postpartum visit. Contact information: [address]  [address] [place] [address] [phone]                     05/20/2022 Allyn Kenner, DO

## 2022-05-28 ENCOUNTER — Telehealth (HOSPITAL_COMMUNITY): Payer: Self-pay

## 2022-05-28 NOTE — Telephone Encounter (Signed)
Patient did not answer phone call. Voicemail left for patient.   [facility] [employer]   05/28/22,1411

## 2022-06-27 ENCOUNTER — Other Ambulatory Visit (HOSPITAL_BASED_OUTPATIENT_CLINIC_OR_DEPARTMENT_OTHER): Payer: Self-pay

## 2022-06-27 ENCOUNTER — Ambulatory Visit (INDEPENDENT_AMBULATORY_CARE_PROVIDER_SITE_OTHER): Payer: 59 | Admitting: Psychiatry

## 2022-06-27 ENCOUNTER — Encounter: Payer: Self-pay | Admitting: Psychiatry

## 2022-06-27 ENCOUNTER — Other Ambulatory Visit: Payer: Self-pay

## 2022-06-27 DIAGNOSIS — Z635 Disruption of family by separation and divorce: Secondary | ICD-10-CM

## 2022-06-27 DIAGNOSIS — F411 Generalized anxiety disorder: Secondary | ICD-10-CM | POA: Diagnosis not present

## 2022-06-27 DIAGNOSIS — F4310 Post-traumatic stress disorder, unspecified: Secondary | ICD-10-CM | POA: Diagnosis not present

## 2022-06-27 DIAGNOSIS — F3181 Bipolar II disorder: Secondary | ICD-10-CM | POA: Diagnosis not present

## 2022-06-27 DIAGNOSIS — F902 Attention-deficit hyperactivity disorder, combined type: Secondary | ICD-10-CM | POA: Diagnosis not present

## 2022-06-27 MED ORDER — LAMOTRIGINE 150 MG PO TABS: 150.0000 mg | ORAL_TABLET | Freq: Every day | ORAL | 0 refills | Status: DC

## 2022-06-27 MED ORDER — ARIPIPRAZOLE 5 MG PO TABS: 5.0000 mg | ORAL_TABLET | Freq: Every day | ORAL | 1 refills | Status: DC

## 2022-06-27 MED ORDER — LISDEXAMFETAMINE DIMESYLATE 40 MG PO CAPS
40.0000 mg | ORAL_CAPSULE | Freq: Every day | ORAL | 0 refills | Status: DC
  Filled 2022-06-27: qty 30, 30d supply, fill #0

## 2022-06-27 MED ORDER — SERTRALINE HCL 100 MG PO TABS: 150.0000 mg | ORAL_TABLET | Freq: Every day | ORAL | 0 refills | Status: DC

## 2022-06-27 NOTE — Progress Notes (Signed)
RUSHDA RITZ [id number] [birth year] 32 y.o.    Subjective:   Patient ID:  ARTHA ZEMBA is a 32 y.o. (DOB 08/11/1990) female.  Chief Complaint:  Chief Complaint  Patient presents with   Follow-up   Depression   Anxiety   Manic Behavior   ADD    Anxiety Symptoms include decreased concentration and nausea. Patient reports no confusion, dizziness, nervous/anxious behavior or suicidal ideas.     Elonda Husky presents to the office today for follow-up of Bipolar 2, and GAD with obsessional elements.    seen Nov 2020. Sertraline was reduced from 150 to 100 to reduce sexual SE.  Tooo irritable after a month.  Better with 150 mg daily.  Irritability generally managed.  08/15/2019 appointment with the following noted: Pretty good overall.  Taking Lamotrigine AM and noted fuse shorter and switched it to night and then insomnia.  Then switched lamotrigine and sertraline to AM and sleep and irritability are better now.  Less vivid dream which would before shake her up emotionally for a few days.  Periods of being overwhelmed still. Some days feels in quicksand and hard to get out of it and unmotivated and low energy.  Once going is OK.  Sometimes numbness to emotion esp re intimacy  With husband.  Low libido but doesn't interfere with pleasure in sex.   Anxiety pretty good except bothered by mask in a store.  Mostly DT the breathing issues. Zoloft increased postpartum related to [name] birth she had flashbacks.   Especially with 2 small kids. 2 weeks gone back to gym   Less afternoon slump.  Better energy and motivation. Sleep 8 hours. Plan: Due to sexual side effects attempt transition from sertraline to [place].  09/12/2019 appointment select appointment phone call patient complaining of some tingling and dizziness immediately after transition to Viibryd 20 mg daily also with some sweating.  Did some improvement in more normal emotionality.  Was felt to could potentially be some  residual symptoms of serotonin withdrawal and to take a low dose of sertraline for 5 days and then stop it again.  09/27/2019 appointment with the following noted: Dizziness and tingly resolved with switch to Viibryd 20 and extra sertraline. Melene Plan is shorter than on Zoloft.  Quick to anger.  Positive however is feeling emotions otherwise more normally.  Tearful with happy things.  Less numbed.  More enjoyment and interest sexually but not a lot of motivation. More emotionally connected to husband. No mood swings. Feels more productive and energetic so far too. Plan: increasse Viibryd to 30 mg to help irritability  12/02/19 appt with the following noted: Since last appt had to do peer-peer appeal of denial of coverage for Viibryrd which was successful. Now on Viibryd 40 mg daily for about a month. Doing pretty good with it.  Feels pretty level and better than at lower dose with less anger and short-fused and less irritable.  Don't feel like a zombie or super numb but don't feel like she'll snap.  Better with numbness vs Zoloft.  Less sexual SE than Zoloft but kids interfere.   Not depressed.  Some anxiety.  Some worry over baby's breathing and will check repeatedly but usually can let go after a while. Plan no med changes  03/03/2020 appointment with the following noted: Called in Oct and wanted to swtich back to Zoloft bc irritability and lack of patience and weird dreams. Episode of sleep paralysis when took it at night.  Happened 3 times  with 3rd time the worst. On sertraline 100 irritability and NM resolved.  H noticed Zoloft was better too.   Much more level.   Son disturbs sleep.   At first it felt more normal to be able to cry but that benefit was not worth the rest with Viibryd. Plan no med changes.  06/29/2020 appointment with the following noted: Depression after Xmas.   Struggles with [name] with ADHD and psych med trials.  Got into place where felt alone and bad about herself and  wanted to sleep a lot.   Not that dark in years.  A lot better now and happy now.  [name] doing a lot better and pt feels better now too.  Wonders about increasing Zoloft to 200 bc did so when down and it seemed to help.  Feels more productive and happy with 200 mg daily and less triggered with mood.  More productive. Taking Zoloft 100 mg daily now. Plan: Increase Zoloft to 150 mg  .  For reactivity and depression risk.   Cont lamotrigine 150 mg daily  03/15/21 appt noted: On sertraline 150 mg since here.   Hard time just go through motions.  Hard to enjoy. Tired a lot.  Depression.   Low motivation and productivity.  Still some irritabilty.  Sleep is normal unless son gets her up. Anxiety less of a problem. Plan: Abilify faster vs duloxetine switch.  Abilify potentiation. 2.5  up to 5 in a few days if needed.  05/11/2021 appointment with the following noted: Abilify was the missing link and mother noticed benefit in a few days.   No longer feels like a shell and more like a person.  Better energy.  Better function.  Less guilt and negativity. No SE [name] travels rough when he's gone. D ADHD and suspected autism.  Pending evaluation.  Has OT.  Difficult. [name] is 7 but has good memory. Patient reports stable mood and denies depressed or irritable moods.  Patient denies any recent difficulty with anxiety.  Patient denies difficulty with sleep initiation or maintenance. Denies appetite disturbance.  Patient reports that energy and motivation have been good.  Patient denies any difficulty with concentration.  Patient denies any suicidal ideation. Plan: No med changes.  Continue lamotrigine 150, Abilify 2.5, sertraline 150 mg daily  08/09/2021 appointment with the following noted: Doing pretty good.  No sig depression episodes.   Asks about ADHD.  Find it hard to get stuff done.  Task paralysis.  Initiative problems.  D has ADHD.  Has trouble getting things finished for example painting rooms.  Can  get overstimulated with sound.  Overwhelmed with tasks and finishing them. Not losing things.  Getting things organized is difficult.   Driving is OK bc fear accidents.  Can interrupt in conversations bc sense of urgency.   Had trouble with timed and other tests and would freeze up with getting overwhelmed. Fidgety.  Hard to sit and watch a movie without doing something else with her hands. Sleep 8 hours. B started Vyvanse with benefit.  H doesn't have ADHD. P;an: Vyvanse 30 mg AM  10/13/21 appt noted: No Abilify. On sertraline 100 and lamotrigine 150.  On PNV. Vyvanse changed my world.  Better function.  Less irritable.  Buffer now.  Less overwhelmed.  Mind quieter on Vyvanse. Pregnant as of July 1.  Talked to on call OB bc didn't want to give up the benefit. OB Ok'd lamotrigine and sertraline and low dose Vyvanse.  So can  stay on it. SE less appetite but is eating normally meals. First [place] tomorrow. Plan: No med changes.  Continue Vyvanse 30 mg every morning, sertraline 100 mg daily, lamotrigine 150 mg daily  01/13/22 appt noted: Med wise OK. Pregnant and misses Abilify and doesn't feels she can go higher on Vyvanse.  Enough to get through pregnancy but looks forward to getting back on normal doses and increasing Vyvanse. Misses Abilify enhancing Zoloft.  Higher doses Zoloft don't help. Stress at home.  Marital issues which H doesn't see.   Pregnant [redacted] weeks.  C-section sched 05/17/22. No problems so far.  Boy. Exhausted with pregnancy and nausea with food aversions.  Not weighed herself in a couple of weeks.  Lost 10# since beginning of pregnancy. More pt on vyvanse than before it.  06/27/22 appt noted: On sertraline 100 and lamotrigine 150, Vyvanse 30 AM Son born 6 weeks ago, Mikeal Hawthorne.  Had 3rd C section.  More painful this time.  Better now.   D Petyon autistic and attached.  Son [name] doing well with it too. Mood "hard time" with separation from narcissistic H and dealing with it.  H  has a GF.  Stress with credit card debt, got sued over it.  Father  paid it off.  Son's cat died abruptly.   Working on herself spiritually.  Has tried to reconcile with H.   Thinks felet better with Abilify and and sertraline 150 with other meds.  [name] was born via emergency C-section November 18, 2017 and swallowed fluid resulting in hypoxia and a NICU stay of 5 days.  Patient had some PTSD symptoms thereafter but those have largely resolved.  Past Psychiatric Medication Trials:  Lexapro, viibryd 40 poor response,  Zoloft 150 Vyvanse 30 AM Depakote, Lamotrigine,                                                Increase the lamotrigine and sertraline to 150mg  each in January 2019. Was depressed at the visit in December but symptoms were resolved with the addition of Abilify 2.5 mg daily Note: Mother also with a similar pattern of mood disorder, bipolar type II.  M and B on Cymbalta.                    Review of Systems:  Review of Systems  Constitutional:  Positive for fatigue.  Gastrointestinal:  Positive for nausea. Negative for diarrhea.  Neurological:  Negative for dizziness and tremors.  Psychiatric/Behavioral:  Positive for decreased concentration. Negative for agitation, behavioral problems, confusion, dysphoric mood, hallucinations, self-injury, sleep disturbance and suicidal ideas. The patient is not nervous/anxious and is not hyperactive.     Medications: I have reviewed the patient's current medications.  Current Outpatient Medications  Medication Sig Dispense Refill   ARIPiprazole (ABILIFY) 5 MG tablet Take 1 tablet (5 mg total) by mouth daily. 30 tablet 1   lamoTRIgine (LAMICTAL) 150 MG tablet Take 1 tablet (150 mg total) by mouth daily. 90 tablet 0   lisdexamfetamine (VYVANSE) 40 MG capsule Take 1 capsule (40 mg total) by mouth daily. 30 capsule 0   sertraline (ZOLOFT) 100 MG tablet Take 1.5 tablets (150 mg total) by mouth daily. 135 tablet 0   No current  facility-administered medications for this visit.    Medication Side Effects: None, sexual SE  Allergies: No Known  Allergies  Past Medical History:  Diagnosis Date   ADHD    ALLERGIC RHINITIS    seasonal   Depression    Dyslipidemia 01/11/2013   CPX 01/2013 dx - Recommend diet/aerobic exercise changes with weight reduction   Exercise-induced asthma    Generalized anxiety disorder    Migraine    PCOS (polycystic ovarian syndrome)     Family History  Problem Relation Age of Onset   Hypertension Mother    Arthritis Mother    Arthritis Father    Alcohol abuse Maternal Grandmother    Hyperlipidemia Maternal Grandmother    Stroke Maternal Grandmother    Hyperlipidemia Maternal Grandfather    Hyperlipidemia Paternal Grandmother    Hyperlipidemia Paternal Grandfather    Hypertension Paternal Grandfather    Mother also has bipolar disorder type II plus anxiety Social History   Socioeconomic History   Marital status: Legally Separated    Spouse name: Not on file   Number of children: Not on file   Years of education: Not on file   Highest education level: Not on file  Occupational History   Not on file  Tobacco Use   Smoking status: Former    Types: Cigarettes   Smokeless tobacco: Never  Vaping Use   Vaping Use: Former   Quit date: 10/03/2021   Substances: Nicotine  Substance and Sexual Activity   Alcohol use: No   Drug use: No   Sexual activity: Not on file  Other Topics Concern   Not on file  Social History Narrative   Not on file   Social Determinants of Health   Financial Resource Strain: Not on file  Food Insecurity: No Food Insecurity (05/17/2022)   Hunger Vital Sign    Worried About Running Out of Food in the Last Year: Never true    Ran Out of Food in the Last Year: Never true  Transportation Needs: No Transportation Needs (05/17/2022)   PRAPARE - Hydrologist (Medical): No    Lack of Transportation (Non-Medical): No   Physical Activity: Not on file  Stress: Not on file  Social Connections: Not on file  Intimate Partner Violence: Not At Risk (05/17/2022)   Humiliation, Afraid, Rape, and Kick questionnaire    Fear of Current or Ex-Partner: No    Emotionally Abused: No    Physically Abused: No    Sexually Abused: No    Past Medical History, Surgical history, Social history, and Family history were reviewed and updated as appropriate.   Please see review of systems for further details on the patient's review from today.   Objective:   Physical Exam:  There were no vitals taken for this visit.  Physical Exam Constitutional:      General: She is not in acute distress. Musculoskeletal:        General: No deformity.  Neurological:     Mental Status: She is alert and oriented to person, place, and time.     Cranial Nerves: No dysarthria.     Coordination: Coordination normal.  Psychiatric:        Attention and Perception: Perception normal. She is inattentive. She does not perceive auditory or visual hallucinations.        Mood and Affect: Mood is anxious. Mood is not depressed. Affect is tearful. Affect is not labile, blunt or inappropriate.        Speech: Speech normal.        Behavior: Behavior normal. Behavior is not agitated.  Behavior is cooperative.        Thought Content: Thought content normal. Thought content is not paranoid or delusional. Thought content does not include homicidal or suicidal ideation. Thought content does not include suicidal plan.        Cognition and Memory: Cognition and memory normal.        Judgment: Judgment normal.     Comments: No manic symptoms.  Insight good.   Stressed with separation Talkative      Lab Review:     Component Value Date/Time   NA 138 01/21/2015 1051   K 4.4 01/21/2015 1051   CL 105 01/21/2015 1051   CO2 25 01/21/2015 1051   GLUCOSE 82 01/21/2015 1051   BUN 13 01/21/2015 1051   CREATININE 0.64 01/21/2015 1051   CALCIUM 9.6  01/21/2015 1051   PROT 7.6 01/21/2015 1051   ALBUMIN 4.1 01/21/2015 1051   AST 12 01/21/2015 1051   ALT 15 01/21/2015 1051   ALKPHOS 42 01/21/2015 1051   BILITOT 0.4 01/21/2015 1051   GFRNONAA >90 10/22/2013 2000   GFRAA >90 10/22/2013 2000       Component Value Date/Time   WBC 12.2 (H) 05/18/2022 0640   RBC 2.93 (L) 05/18/2022 0640   HGB 8.0 (L) 05/18/2022 0640   HCT 24.6 (L) 05/18/2022 0640   PLT 184 05/18/2022 0640   MCV 84.0 05/18/2022 0640   MCH 27.3 05/18/2022 0640   MCHC 32.5 05/18/2022 0640   RDW 12.9 05/18/2022 0640   LYMPHSABS 2.5 01/21/2015 1051   MONOABS 0.7 01/21/2015 1051   EOSABS 0.1 01/21/2015 1051   BASOSABS 0.0 01/21/2015 1051    No results found for: "POCLITH", "LITHIUM"   No results found for: "PHENYTOIN", "PHENOBARB", "VALPROATE", "CBMZ"   .res Assessment: Plan:    Bipolar II disorder ([place]) - Plan: ARIPiprazole (ABILIFY) 5 MG tablet, lamoTRIgine (LAMICTAL) 150 MG tablet  Generalized anxiety disorder - Plan: sertraline (ZOLOFT) 100 MG tablet  PTSD (post-traumatic stress disorder) - Plan: sertraline (ZOLOFT) 100 MG tablet  Attention deficit hyperactivity disorder (ADHD), combined type - Plan: lisdexamfetamine (VYVANSE) 40 MG capsule  Marital disruption involving estrangement   ADHD ASRS scale 18 inattentive; 31 hyperactivity= high score  Greater than 50% of 45 min face to face time with patient was spent on counseling and coordination of care. We discussed symptoms are well controlled at this time.  Tolerating meds well.   Disc the risk.    Counseled patient regarding potential benefits, risks, and side effects of Lamictal to include potential risk of Stevens-Johnson syndrome. Advised patient to stop taking Lamictal and contact office immediately if rash develops and to seek urgent medical attention if rash is severe and/or spreading quickly.  Supportive therapy re: dealing with separation and post-partum issues.  Struggling with self-esteem.  Enc  counseling with H if possible.  She doesn't think reconciliation is possible.  Disc bc of kids it is likely they will have to relate in ongoing fashion and counseling may help.   Answered questions about EMDR.  OK Increase Zoloft to 150 mg.   she reduced to 100 mg when pregnant. .  For reactivity and depression risk.  Disc risk mood cycling.  She and mother have done well with it.  Cont lamotrigine 150 mg daily  Discussed potential benefits, risks, and side effects of stimulants with patient to include increased heart rate, palpitations, insomnia, increased anxiety, increased irritability, or decreased appetite.  Instructed patient to contact office if experiencing any significant tolerability issues.  Disc risk mania in bipolar pts.  She wants to continue stimulant bc marked benefit from low dose. Ok to increase Vyvanse 40 mg AM  Bottle feeding.  Tried breast feeding and couldn't.    For depressive sx so restart Abilify .  Disc dosing 2.5 mg daily.  FU 2 mos  Lynder Parents, MD, DFAPA   Please see After Visit Summary for patient specific instructions.  No future appointments.   No orders of the defined types were placed in this encounter.      -------------------------------

## 2022-07-22 ENCOUNTER — Telehealth: Payer: Self-pay | Admitting: Psychiatry

## 2022-07-22 NOTE — Telephone Encounter (Signed)
LF 3/25, due 4/22.

## 2022-07-22 NOTE — Telephone Encounter (Signed)
Pt called reporting Vyvanse 40 mg not working as well as before. Requesting to increase to 50 mg. [facility]. APT 5/22

## 2022-07-25 ENCOUNTER — Telehealth: Payer: Self-pay

## 2022-07-25 ENCOUNTER — Other Ambulatory Visit: Payer: Self-pay | Admitting: Psychiatry

## 2022-07-25 ENCOUNTER — Other Ambulatory Visit (HOSPITAL_BASED_OUTPATIENT_CLINIC_OR_DEPARTMENT_OTHER): Payer: Self-pay

## 2022-07-25 ENCOUNTER — Encounter (HOSPITAL_BASED_OUTPATIENT_CLINIC_OR_DEPARTMENT_OTHER): Payer: Self-pay

## 2022-07-25 MED ORDER — LISDEXAMFETAMINE DIMESYLATE 50 MG PO CAPS
50.0000 mg | ORAL_CAPSULE | Freq: Every day | ORAL | 0 refills | Status: DC
  Filled 2022-07-25: qty 30, 30d supply, fill #0

## 2022-07-25 NOTE — Telephone Encounter (Signed)
Prior Authorization Lisdexamfetamine 50 mg  #30/30 [employer]

## 2022-07-25 NOTE — Telephone Encounter (Signed)
[employer] approved VYVANSE capseul  07/25/22-04/04/23 [phone]

## 2022-07-25 NOTE — Telephone Encounter (Signed)
Ok.  Sent vyvanse 50

## 2022-07-26 ENCOUNTER — Other Ambulatory Visit (HOSPITAL_BASED_OUTPATIENT_CLINIC_OR_DEPARTMENT_OTHER): Payer: Self-pay

## 2022-07-30 ENCOUNTER — Other Ambulatory Visit: Payer: Self-pay | Admitting: Psychiatry

## 2022-07-30 DIAGNOSIS — F3181 Bipolar II disorder: Secondary | ICD-10-CM

## 2022-08-24 ENCOUNTER — Encounter (HOSPITAL_BASED_OUTPATIENT_CLINIC_OR_DEPARTMENT_OTHER): Payer: Self-pay

## 2022-08-24 ENCOUNTER — Ambulatory Visit (INDEPENDENT_AMBULATORY_CARE_PROVIDER_SITE_OTHER): Payer: BC Managed Care – PPO | Admitting: Psychiatry

## 2022-08-24 ENCOUNTER — Telehealth: Payer: Self-pay

## 2022-08-24 ENCOUNTER — Encounter: Payer: Self-pay | Admitting: Psychiatry

## 2022-08-24 ENCOUNTER — Other Ambulatory Visit (HOSPITAL_BASED_OUTPATIENT_CLINIC_OR_DEPARTMENT_OTHER): Payer: Self-pay

## 2022-08-24 DIAGNOSIS — Z635 Disruption of family by separation and divorce: Secondary | ICD-10-CM

## 2022-08-24 DIAGNOSIS — F902 Attention-deficit hyperactivity disorder, combined type: Secondary | ICD-10-CM | POA: Diagnosis not present

## 2022-08-24 DIAGNOSIS — F4310 Post-traumatic stress disorder, unspecified: Secondary | ICD-10-CM

## 2022-08-24 DIAGNOSIS — F3181 Bipolar II disorder: Secondary | ICD-10-CM | POA: Diagnosis not present

## 2022-08-24 DIAGNOSIS — F411 Generalized anxiety disorder: Secondary | ICD-10-CM

## 2022-08-24 MED ORDER — SERTRALINE HCL 100 MG PO TABS
150.0000 mg | ORAL_TABLET | Freq: Every day | ORAL | 0 refills | Status: DC
Start: 2022-08-24 — End: 2022-10-24

## 2022-08-24 MED ORDER — ARIPIPRAZOLE 5 MG PO TABS: 5.0000 mg | ORAL_TABLET | Freq: Every day | ORAL | 0 refills | Status: DC

## 2022-08-24 MED ORDER — LAMOTRIGINE 150 MG PO TABS: 150.0000 mg | ORAL_TABLET | Freq: Every day | ORAL | 0 refills | Status: DC

## 2022-08-24 MED ORDER — LISDEXAMFETAMINE DIMESYLATE 60 MG PO CAPS
60.0000 mg | ORAL_CAPSULE | Freq: Every day | ORAL | 0 refills | Status: DC
Start: 2022-08-24 — End: 2022-09-30
  Filled 2022-08-24: qty 30, 30d supply, fill #0

## 2022-08-24 NOTE — Telephone Encounter (Addendum)
Prior Authorization Lisdexamfetamine 60 mg #30/30 [employer]  This drug is covered on the Preferred Drug List. It does not require prior approval. You can  get 30 capsules per 30 days. Please call the pharmacy to process the claim.

## 2022-08-24 NOTE — Progress Notes (Signed)
Felicia Young [id number] May 02, 1990 32 y.o.    Subjective:   [name] is a 32 y.o. (DOB 1990/08/27) female.  Chief Complaint:  Chief Complaint  Patient presents with   Follow-up    Mood and ADD and anxiety    Anxiety Symptoms include decreased concentration and nausea. Patient reports no confusion, nervous/anxious behavior or suicidal ideas.     [name] presents to the office today for follow-up of Bipolar 2, and GAD with obsessional elements.    seen Nov 2020. Sertraline was reduced from 150 to 100 to reduce sexual SE.  Tooo irritable after a month.  Better with 150 mg daily.  Irritability generally managed.  08/15/2019 appointment with the following noted: Pretty good overall.  Taking Lamotrigine AM and noted fuse shorter and switched it to night and then insomnia.  Then switched lamotrigine and sertraline to AM and sleep and irritability are better now.  Less vivid dream which would before shake her up emotionally for a few days.  Periods of being overwhelmed still. Some days feels in quicksand and hard to get out of it and unmotivated and low energy.  Once going is OK.  Sometimes numbness to emotion esp re intimacy  With husband.  Low libido but doesn't interfere with pleasure in sex.   Anxiety pretty good except bothered by mask in a store.  Mostly DT the breathing issues. Zoloft increased postpartum related to [name] birth she had flashbacks.   Especially with 2 small kids. 2 weeks gone back to gym   Less afternoon slump.  Better energy and motivation. Sleep 8 hours. Plan: Due to sexual side effects attempt transition from sertraline to Viibryd.  09/12/2019 appointment select appointment phone call patient complaining of some tingling and dizziness immediately after transition to Viibryd 20 mg daily also with some sweating.  Did some improvement in more normal emotionality.  Was felt to could potentially be some residual symptoms of serotonin  withdrawal and to take a low dose of sertraline for 5 days and then stop it again.  09/27/2019 appointment with the following noted: Dizziness and tingly resolved with switch to Viibryd 20 and extra sertraline. [name] is shorter than on Zoloft.  Quick to anger.  Positive however is feeling emotions otherwise more normally.  Tearful with happy things.  Less numbed.  More enjoyment and interest sexually but not a lot of motivation. More emotionally connected to husband. No mood swings. Feels more productive and energetic so far too. Plan: increasse Viibryd to 30 mg to help irritability  12/02/19 appt with the following noted: Since last appt had to do peer-peer appeal of denial of coverage for Viibryrd which was successful. Now on Viibryd 40 mg daily for about a month. Doing pretty good with it.  Feels pretty level and better than at lower dose with less anger and short-fused and less irritable.  Don't feel like a zombie or super numb but don't feel like she'll snap.  Better with numbness vs Zoloft.  Less sexual SE than Zoloft but kids interfere.   Not depressed.  Some anxiety.  Some worry over baby's breathing and will check repeatedly but usually can let go after a while. Plan no med changes  03/03/2020 appointment with the following noted: Called in Oct and wanted to swtich back to Zoloft bc irritability and lack of patience and weird dreams. Episode of sleep paralysis when took it at night.  Happened 3 times with 3rd time the worst. On  sertraline 100 irritability and NM resolved.  H noticed Zoloft was better too.   Much more level.   Son disturbs sleep.   At first it felt more normal to be able to cry but that benefit was not worth the rest with Viibryd. Plan no med changes.  06/29/2020 appointment with the following noted: Depression after Xmas.   Struggles with [name] with ADHD and psych med trials.  Got into place where felt alone and bad about herself and wanted to sleep a lot.   Not  that dark in years.  A lot better now and happy now.  [name] doing a lot better and pt feels better now too.  Wonders about increasing Zoloft to 200 bc did so when down and it seemed to help.  Feels more productive and happy with 200 mg daily and less triggered with mood.  More productive. Taking Zoloft 100 mg daily now. Plan: Increase Zoloft to 150 mg  .  For reactivity and depression risk.   Cont lamotrigine 150 mg daily  03/15/21 appt noted: On sertraline 150 mg since here.   Hard time just go through motions.  Hard to enjoy. Tired a lot.  Depression.   Low motivation and productivity.  Still some irritabilty.  Sleep is normal unless son gets her up. Anxiety less of a problem. Plan: Abilify faster vs duloxetine switch.  Abilify potentiation. 2.5  up to 5 in a few days if needed.  05/11/2021 appointment with the following noted: Abilify was the missing link and mother noticed benefit in a few days.   No longer feels like a shell and more like a person.  Better energy.  Better function.  Less guilt and negativity. No SE [name] travels rough when he's gone. D ADHD and suspected autism.  Pending evaluation.  Has OT.  Difficult. [name] is 7 but has good memory. Patient reports stable mood and denies depressed or irritable moods.  Patient denies any recent difficulty with anxiety.  Patient denies difficulty with sleep initiation or maintenance. Denies appetite disturbance.  Patient reports that energy and motivation have been good.  Patient denies any difficulty with concentration.  Patient denies any suicidal ideation. Plan: No med changes.  Continue lamotrigine 150, Abilify 2.5, sertraline 150 mg daily  08/09/2021 appointment with the following noted: Doing pretty good.  No sig depression episodes.   Asks about ADHD.  Find it hard to get stuff done.  Task paralysis.  Initiative problems.  D has ADHD.  Has trouble getting things finished for example painting rooms.  Can get overstimulated with  sound.  Overwhelmed with tasks and finishing them. Not losing things.  Getting things organized is difficult.   Driving is OK bc fear accidents.  Can interrupt in conversations bc sense of urgency.   Had trouble with timed and other tests and would freeze up with getting overwhelmed. Fidgety.  Hard to sit and watch a movie without doing something else with her hands. Sleep 8 hours. B started Vyvanse with benefit.  H doesn't have ADHD. P;an: Vyvanse 30 mg AM  10/13/21 appt noted: No Abilify. On sertraline 100 and lamotrigine 150.  On PNV. Vyvanse changed my world.  Better function.  Less irritable.  Buffer now.  Less overwhelmed.  Mind quieter on Vyvanse. Pregnant as of July 1.  Talked to on call OB bc didn't want to give up the benefit. OB Ok'd lamotrigine and sertraline and low dose Vyvanse.  So can stay on it. SE less appetite  but is eating normally meals. First [place] tomorrow. Plan: No med changes.  Continue Vyvanse 30 mg every morning, sertraline 100 mg daily, lamotrigine 150 mg daily  01/13/22 appt noted: Med wise OK. Pregnant and misses Abilify and doesn't feels she can go higher on Vyvanse.  Enough to get through pregnancy but looks forward to getting back on normal doses and increasing Vyvanse. Misses Abilify enhancing Zoloft.  Higher doses Zoloft don't help. Stress at home.  Marital issues which H doesn't see.   Pregnant [redacted] weeks.  C-section sched 05/17/22. No problems so far.  Boy. Exhausted with pregnancy and nausea with food aversions.  Not weighed herself in a couple of weeks.  Lost 10# since beginning of pregnancy. More pt on vyvanse than before it.  06/27/22 appt noted: On sertraline 100 and lamotrigine 150, Vyvanse 30 AM Son born 6 weeks ago, Remi Deter.  Had 3rd C section.  More painful this time.  Better now.   D Petyon autistic and attached.  Son [name] doing well with it too. Mood "hard time" with separation from narcissistic H and dealing with it.  H has a GF.  Stress with  credit card debt, got sued over it.  Father  paid it off.  Son's cat died abruptly.   Working on herself spiritually.  Has tried to reconcile with H.   Thinks felet better with Abilify and and sertraline 150 with other meds. Plan: Ok to increase Vyvanse 40 mg AM Agree with increase sertraline 150 mg daily. Continue lamotrigine 150  TC 07/22/22: asked to increase Vyvanse 50.  Ok.   08/24/22 appt noted: Psych meds: Sertraline 150, lamotrigine 150 daily, Abilify 2.5 mg daily, Vyvanse 50 mg every morning She thinks Vyvanse needs increase again bc more ADHD paralysis, shorter fuse, less focus. Overall still feels benefit Vyvanse. D Peyton good benefit with Concerta. No SE. Overall pleased with meds and balance.  Best mood than in years.  Looking forward to things.  Not struggling to get out of bed.  Happy. Working things out with Trinna Post and better for 3 weeks. Working on Pensions consultant.   Working back in [employer].    [name] was born via emergency C-section November 18, 2017 and swallowed fluid resulting in hypoxia and a NICU stay of 5 days.  Patient had some PTSD symptoms thereafter but those have largely resolved. 3 children.  Past Psychiatric Medication Trials:  Lexapro, viibryd 40 poor response,  Zoloft 150 Vyvanse 30 AM Depakote, Lamotrigine,                                                Increase the lamotrigine and sertraline to 150mg  each in January 2019. Was depressed at the visit in December but symptoms were resolved with the addition of Abilify 2.5 mg daily Note: Mother also with a similar pattern of mood disorder, bipolar type II.  M and B on Cymbalta.                    Review of Systems:  Review of Systems  Constitutional:  Positive for fatigue.  Gastrointestinal:  Positive for nausea. Negative for diarrhea.  Neurological:  Negative for tremors.  Psychiatric/Behavioral:  Positive for decreased concentration. Negative for agitation, behavioral problems, confusion, dysphoric mood,  hallucinations, self-injury, sleep disturbance and suicidal ideas. The patient is not nervous/anxious and is not hyperactive.  Medications: I have reviewed the patient's current medications.  Current Outpatient Medications  Medication Sig Dispense Refill   ARIPiprazole (ABILIFY) 5 MG tablet Take 1 tablet (5 mg total) by mouth daily. 90 tablet 0   lamoTRIgine (LAMICTAL) 150 MG tablet Take 1 tablet (150 mg total) by mouth daily. 90 tablet 0   lisdexamfetamine (VYVANSE) 60 MG capsule Take 1 capsule (60 mg total) by mouth daily. 30 capsule 0   sertraline (ZOLOFT) 100 MG tablet Take 1.5 tablets (150 mg total) by mouth daily. 135 tablet 0   No current facility-administered medications for this visit.    Medication Side Effects: None, sexual SE  Allergies: No Known Allergies  Past Medical History:  Diagnosis Date   ADHD    ALLERGIC RHINITIS    seasonal   Depression    Dyslipidemia 01/11/2013   CPX 01/2013 dx - Recommend diet/aerobic exercise changes with weight reduction   Exercise-induced asthma    Generalized anxiety disorder    Migraine    PCOS (polycystic ovarian syndrome)     Family History  Problem Relation Age of Onset   Hypertension Mother    Arthritis Mother    Arthritis Father    Alcohol abuse Maternal Grandmother    Hyperlipidemia Maternal Grandmother    Stroke Maternal Grandmother    Hyperlipidemia Maternal Grandfather    Hyperlipidemia Paternal Grandmother    Hyperlipidemia Paternal Grandfather    Hypertension Paternal Grandfather    Mother also has bipolar disorder type II plus anxiety Social History   Socioeconomic History   Marital status: Married    Spouse name: Not on file   Number of children: Not on file   Years of education: Not on file   Highest education level: Not on file  Occupational History   Not on file  Tobacco Use   Smoking status: Former    Types: Cigarettes   Smokeless tobacco: Never  Vaping Use   Vaping Use: Former   Quit  date: 10/03/2021   Substances: Nicotine  Substance and Sexual Activity   Alcohol use: No   Drug use: No   Sexual activity: Not on file  Other Topics Concern   Not on file  Social History Narrative   Not on file   Social Determinants of Health   Financial Resource Strain: Not on file  Food Insecurity: No Food Insecurity (05/17/2022)   Hunger Vital Sign    Worried About Running Out of Food in the Last Year: Never true    Ran Out of Food in the Last Year: Never true  Transportation Needs: No Transportation Needs (05/17/2022)   PRAPARE - Administrator, Civil Service (Medical): No    Lack of Transportation (Non-Medical): No  Physical Activity: Not on file  Stress: Not on file  Social Connections: Not on file  Intimate Partner Violence: Not At Risk (05/17/2022)   Humiliation, Afraid, Rape, and Kick questionnaire    Fear of Current or Ex-Partner: No    Emotionally Abused: No    Physically Abused: No    Sexually Abused: No    Past Medical History, Surgical history, Social history, and Family history were reviewed and updated as appropriate.   Please see review of systems for further details on the patient's review from today.   Objective:   Physical Exam:  There were no vitals taken for this visit.  Physical Exam Constitutional:      General: She is not in acute distress. Musculoskeletal:  General: No deformity.  Neurological:     Mental Status: She is alert and oriented to person, place, and time.     Cranial Nerves: No dysarthria.     Coordination: Coordination normal.  Psychiatric:        Attention and Perception: Perception normal. She is inattentive. She does not perceive auditory or visual hallucinations.        Mood and Affect: Mood is anxious. Mood is not depressed. Affect is not labile, blunt, tearful or inappropriate.        Speech: Speech normal.        Behavior: Behavior normal. Behavior is not agitated. Behavior is cooperative.        Thought  Content: Thought content normal. Thought content is not paranoid or delusional. Thought content does not include homicidal or suicidal ideation. Thought content does not include suicidal plan.        Cognition and Memory: Cognition and memory normal.        Judgment: Judgment normal.     Comments: No manic symptoms.  Insight good.   Talkative      Lab Review:     Component Value Date/Time   NA 138 01/21/2015 1051   K 4.4 01/21/2015 1051   CL 105 01/21/2015 1051   CO2 25 01/21/2015 1051   GLUCOSE 82 01/21/2015 1051   BUN 13 01/21/2015 1051   CREATININE 0.64 01/21/2015 1051   CALCIUM 9.6 01/21/2015 1051   PROT 7.6 01/21/2015 1051   ALBUMIN 4.1 01/21/2015 1051   AST 12 01/21/2015 1051   ALT 15 01/21/2015 1051   ALKPHOS 42 01/21/2015 1051   BILITOT 0.4 01/21/2015 1051   GFRNONAA >90 10/22/2013 2000   GFRAA >90 10/22/2013 2000       Component Value Date/Time   WBC 12.2 (H) 05/18/2022 0640   RBC 2.93 (L) 05/18/2022 0640   HGB 8.0 (L) 05/18/2022 0640   HCT 24.6 (L) 05/18/2022 0640   PLT 184 05/18/2022 0640   MCV 84.0 05/18/2022 0640   MCH 27.3 05/18/2022 0640   MCHC 32.5 05/18/2022 0640   RDW 12.9 05/18/2022 0640   LYMPHSABS 2.5 01/21/2015 1051   MONOABS 0.7 01/21/2015 1051   EOSABS 0.1 01/21/2015 1051   BASOSABS 0.0 01/21/2015 1051    No results found for: "POCLITH", "LITHIUM"   No results found for: "PHENYTOIN", "PHENOBARB", "VALPROATE", "CBMZ"   .res Assessment: Plan:    Bipolar II disorder (HCC) - Plan: ARIPiprazole (ABILIFY) 5 MG tablet, lamoTRIgine (LAMICTAL) 150 MG tablet  Generalized anxiety disorder - Plan: sertraline (ZOLOFT) 100 MG tablet  PTSD (post-traumatic stress disorder) - Plan: sertraline (ZOLOFT) 100 MG tablet  Attention deficit hyperactivity disorder (ADHD), combined type - Plan: lisdexamfetamine (VYVANSE) 60 MG capsule  Marital disruption involving estrangement   ADHD ASRS scale 18 inattentive; 31 hyperactivity= high score  30 min face  to face time with patient was spent on counseling and coordination of care. We discussed symptoms are well controlled at this time.  Tolerating meds well.   Disc the risk.    Counseled patient regarding potential benefits, risks, and side effects of Lamictal to include potential risk of Stevens-Johnson syndrome. Advised patient to stop taking Lamictal and contact office immediately if rash develops and to seek urgent medical attention if rash is severe and/or spreading quickly.  Supportive therapy re: dealing with separation and post-partum issues.  Struggling with self-esteem.  Enc counseling with H if possible.  She doesn't think reconciliation is possible.  Disc bc of  kids it is likely they will have to relate in ongoing fashion and counseling may help.   Answered questions about EMDR.  Zoloft 150 mg daily.  For reactivity and depression risk.  Disc risk mood cycling.  She and mother have done well with it.  Cont lamotrigine 150 mg daily  Discussed potential benefits, risks, and side effects of stimulants with patient to include increased heart rate, palpitations, insomnia, increased anxiety, increased irritability, or decreased appetite.  Instructed patient to contact office if experiencing any significant tolerability issues. Disc risk mania in bipolar pts.  She wants to continue stimulant bc marked benefit  Disc dosing range and limit to max dose.  No SE. Disc shortages and withdrawal if missing the doses. Ok increase Vyvanse 60 mg AM  Bottle feeding.  Tried breast feeding and couldn't.    For depressive sx  Abilify .  Disc dosing 2.5 mg daily.  FU 2 mos  Meredith Staggers, MD, DFAPA   Please see After Visit Summary for patient specific instructions.  No future appointments.   No orders of the defined types were placed in this encounter.      -------------------------------

## 2022-08-25 ENCOUNTER — Other Ambulatory Visit (HOSPITAL_BASED_OUTPATIENT_CLINIC_OR_DEPARTMENT_OTHER): Payer: Self-pay

## 2022-08-26 ENCOUNTER — Other Ambulatory Visit: Payer: Self-pay

## 2022-09-30 ENCOUNTER — Telehealth: Payer: Self-pay | Admitting: Psychiatry

## 2022-09-30 ENCOUNTER — Other Ambulatory Visit: Payer: Self-pay

## 2022-09-30 DIAGNOSIS — F902 Attention-deficit hyperactivity disorder, combined type: Secondary | ICD-10-CM

## 2022-09-30 NOTE — Telephone Encounter (Signed)
Pended.

## 2022-09-30 NOTE — Telephone Encounter (Signed)
Next visit is 10/24/22. Requesting refill on Vyvanse 60 mg. Per [place] it is in stock.   [facility]   Phone: [phone]  Fax: [phone]

## 2022-10-02 MED ORDER — LISDEXAMFETAMINE DIMESYLATE 60 MG PO CAPS
60.0000 mg | ORAL_CAPSULE | Freq: Every day | ORAL | 0 refills | Status: DC
Start: 2022-10-02 — End: 2022-10-24
  Filled 2022-10-02: qty 30, 30d supply, fill #0

## 2022-10-03 ENCOUNTER — Other Ambulatory Visit (HOSPITAL_BASED_OUTPATIENT_CLINIC_OR_DEPARTMENT_OTHER): Payer: Self-pay

## 2022-10-03 ENCOUNTER — Other Ambulatory Visit: Payer: Self-pay

## 2022-10-24 ENCOUNTER — Ambulatory Visit (INDEPENDENT_AMBULATORY_CARE_PROVIDER_SITE_OTHER): Payer: BC Managed Care – PPO | Admitting: Psychiatry

## 2022-10-24 ENCOUNTER — Encounter: Payer: Self-pay | Admitting: Psychiatry

## 2022-10-24 ENCOUNTER — Other Ambulatory Visit (HOSPITAL_BASED_OUTPATIENT_CLINIC_OR_DEPARTMENT_OTHER): Payer: Self-pay

## 2022-10-24 DIAGNOSIS — F902 Attention-deficit hyperactivity disorder, combined type: Secondary | ICD-10-CM | POA: Diagnosis not present

## 2022-10-24 DIAGNOSIS — Z635 Disruption of family by separation and divorce: Secondary | ICD-10-CM

## 2022-10-24 DIAGNOSIS — F411 Generalized anxiety disorder: Secondary | ICD-10-CM

## 2022-10-24 DIAGNOSIS — F3181 Bipolar II disorder: Secondary | ICD-10-CM

## 2022-10-24 DIAGNOSIS — F4310 Post-traumatic stress disorder, unspecified: Secondary | ICD-10-CM

## 2022-10-24 MED ORDER — LISDEXAMFETAMINE DIMESYLATE 60 MG PO CAPS
60.0000 mg | ORAL_CAPSULE | ORAL | 0 refills | Status: DC
Start: 2022-12-19 — End: 2022-12-08

## 2022-10-24 MED ORDER — ARIPIPRAZOLE 5 MG PO TABS
5.0000 mg | ORAL_TABLET | Freq: Every day | ORAL | 1 refills | Status: DC
Start: 2022-10-24 — End: 2023-07-31

## 2022-10-24 MED ORDER — SERTRALINE HCL 100 MG PO TABS: 150.0000 mg | ORAL_TABLET | Freq: Every day | ORAL | 1 refills | Status: DC

## 2022-10-24 MED ORDER — LISDEXAMFETAMINE DIMESYLATE 60 MG PO CAPS
60.0000 mg | ORAL_CAPSULE | Freq: Every day | ORAL | 0 refills | Status: DC
Start: 2022-10-24 — End: 2023-01-06
  Filled 2022-10-24 – 2022-11-04 (×3): qty 30, 30d supply, fill #0

## 2022-10-24 MED ORDER — LISDEXAMFETAMINE DIMESYLATE 60 MG PO CAPS
60.0000 mg | ORAL_CAPSULE | ORAL | 0 refills | Status: DC
Start: 2022-11-21 — End: 2022-12-08
  Filled 2022-12-08 (×2): qty 30, 30d supply, fill #0

## 2022-10-24 NOTE — Progress Notes (Signed)
Felicia Young [id number] 1991-01-03 32 y.o.    Subjective:   [name] is a 32 y.o. (DOB 05/24/1990) female.  Chief Complaint:  Chief Complaint  Patient presents with   Follow-up   Depression   Anxiety   ADD   Stress    Anxiety Symptoms include decreased concentration and nausea. Patient reports no confusion, nervous/anxious behavior or suicidal ideas.    Depression        Associated symptoms include decreased concentration and fatigue.  Associated symptoms include no suicidal ideas.  Felicia Young presents to the office today for follow-up of Bipolar 2, and GAD with obsessional elements.    seen Nov 2020. Sertraline was reduced from 150 to 100 to reduce sexual SE.  Tooo irritable after a month.  Better with 150 mg daily.  Irritability generally managed.  08/15/2019 appointment with the following noted: Pretty good overall.  Taking Lamotrigine AM and noted fuse shorter and switched it to night and then insomnia.  Then switched lamotrigine and sertraline to AM and sleep and irritability are better now.  Less vivid dream which would before shake her up emotionally for a few days.  Periods of being overwhelmed still. Some days feels in quicksand and hard to get out of it and unmotivated and low energy.  Once going is OK.  Sometimes numbness to emotion esp re intimacy  With husband.  Low libido but doesn't interfere with pleasure in sex.   Anxiety pretty good except bothered by mask in a store.  Mostly DT the breathing issues. Zoloft increased postpartum related to Felicia Young's birth she had flashbacks.   Especially with 2 small kids. 2 weeks gone back to gym   Less afternoon slump.  Better energy and motivation. Sleep 8 hours. Plan: Due to sexual side effects attempt transition from sertraline to Viibryd.  09/12/2019 appointment select appointment phone call patient complaining of some tingling and dizziness immediately after transition to Viibryd 20 mg daily also  with some sweating.  Did some improvement in more normal emotionality.  Was felt to could potentially be some residual symptoms of serotonin withdrawal and to take a low dose of sertraline for 5 days and then stop it again.  09/27/2019 appointment with the following noted: Dizziness and tingly resolved with switch to Viibryd 20 and extra sertraline. [name] is shorter than on Zoloft.  Quick to anger.  Positive however is feeling emotions otherwise more normally.  Tearful with happy things.  Less numbed.  More enjoyment and interest sexually but not a lot of motivation. More emotionally connected to husband. No mood swings. Feels more productive and energetic so far too. Plan: increasse Viibryd to 30 mg to help irritability  12/02/19 appt with the following noted: Since last appt had to do peer-peer appeal of denial of coverage for Viibryrd which was successful. Now on Viibryd 40 mg daily for about a month. Doing pretty good with it.  Feels pretty level and better than at lower dose with less anger and short-fused and less irritable.  Don't feel like a zombie or super numb but don't feel like she'll snap.  Better with numbness vs Zoloft.  Less sexual SE than Zoloft but kids interfere.   Not depressed.  Some anxiety.  Some worry over baby's breathing and will check repeatedly but usually can let go after a while. Plan no med changes  03/03/2020 appointment with the following noted: Called in Oct and wanted to swtich back to Zoloft bc irritability  and lack of patience and weird dreams. Episode of sleep paralysis when took it at night.  Happened 3 times with 3rd time the worst. On sertraline 100 irritability and NM resolved.  Felicia noticed Zoloft was better too.   Much more level.   Son disturbs sleep.   At first it felt more normal to be able to cry but that benefit was not worth the rest with Viibryd. Plan no med changes.  06/29/2020 appointment with the following noted: Depression after Xmas.    Struggles with Felicia Young with ADHD and psych med trials.  Got into place where felt alone and bad about herself and wanted to sleep a lot.   Not that dark in years.  A lot better now and happy now.  Felicia Young doing a lot better and pt feels better now too.  Wonders about increasing Zoloft to 200 bc did so when down and it seemed to help.  Feels more productive and happy with 200 mg daily and less triggered with mood.  More productive. Taking Zoloft 100 mg daily now. Plan: Increase Zoloft to 150 mg  .  For reactivity and depression risk.   Cont lamotrigine 150 mg daily  03/15/21 appt noted: On sertraline 150 mg since here.   Hard time just go through motions.  Hard to enjoy. Tired a lot.  Depression.   Low motivation and productivity.  Still some irritabilty.  Sleep is normal unless son gets her up. Anxiety less of a problem. Plan: Abilify faster vs duloxetine switch.  Abilify potentiation. 2.5  up to 5 in a few days if needed.  05/11/2021 appointment with the following noted: Abilify was the missing link and mother noticed benefit in a few days.   No longer feels like a shell and more like a person.  Better energy.  Better function.  Less guilt and negativity. No SE Felicia Young travels rough when he's gone. Felicia Young ADHD and suspected autism.  Pending evaluation.  Has OT.  Difficult. Felicia Young is 6 but has good memory. Patient reports stable mood and denies depressed or irritable moods.  Patient denies any recent difficulty with anxiety.  Patient denies difficulty with sleep initiation or maintenance. Denies appetite disturbance.  Patient reports that energy and motivation have been good.  Patient denies any difficulty with concentration.  Patient denies any suicidal ideation. Plan: No med changes.  Continue lamotrigine 150, Abilify 2.5, sertraline 150 mg daily  08/09/2021 appointment with the following noted: Doing pretty good.  No sig depression episodes.   Asks about ADHD.  Find it hard to get stuff done.  Task  paralysis.  Initiative problems.  Felicia Young has ADHD.  Has trouble getting things finished for example painting rooms.  Can get overstimulated with sound.  Overwhelmed with tasks and finishing them. Not losing things.  Getting things organized is difficult.   Driving is OK bc fear accidents.  Can interrupt in conversations bc sense of urgency.   Had trouble with timed and other tests and would freeze up with getting overwhelmed. Fidgety.  Hard to sit and watch a movie without doing something else with her hands. Sleep 8 hours. B started Vyvanse with benefit.  Felicia doesn't have ADHD. P;an: Vyvanse 30 mg AM  10/13/21 appt noted: No Abilify. On sertraline 100 and lamotrigine 150.  On PNV. Vyvanse changed my world.  Better function.  Less irritable.  Buffer now.  Less overwhelmed.  Mind quieter on Vyvanse. Pregnant as of July 1.  Talked to on call OB  bc didn't want to give up the benefit. OB Ok'Felicia Young lamotrigine and sertraline and low dose Vyvanse.  So can stay on it. SE less appetite but is eating normally meals. First [place] tomorrow. Plan: No med changes.  Continue Vyvanse 30 mg every morning, sertraline 100 mg daily, lamotrigine 150 mg daily  01/13/22 appt noted: Med wise OK. Pregnant and misses Abilify and doesn't feels she can go higher on Vyvanse.  Enough to get through pregnancy but looks forward to getting back on normal doses and increasing Vyvanse. Misses Abilify enhancing Zoloft.  Higher doses Zoloft don't help. Stress at home.  Marital issues which Felicia doesn't see.   Pregnant [redacted] weeks.  C-section sched 05/17/22. No problems so far.  Boy. Exhausted with pregnancy and nausea with food aversions.  Not weighed herself in a couple of weeks.  Lost 10# since beginning of pregnancy. More pt on vyvanse than before it.  06/27/22 appt noted: On sertraline 100 and lamotrigine 150, Vyvanse 30 AM Son born 6 weeks ago, Remi Deter.  Had 3rd C section.  More painful this time.  Better now.   Felicia Young Petyon autistic and attached.   Son Felicia Young doing well with it too. Mood "hard time" with separation from narcissistic Felicia and dealing with it.  Felicia has a GF.  Stress with credit card debt, got sued over it.  Father  paid it off.  Son's cat died abruptly.   Working on herself spiritually.  Has tried to reconcile with Felicia.   Thinks felet better with Abilify and and sertraline 150 with other meds. Plan: Ok to increase Vyvanse 40 mg AM Agree with increase sertraline 150 mg daily. Continue lamotrigine 150  TC 07/22/22: asked to increase Vyvanse 50.  Ok.   08/24/22 appt noted: Psych meds: Sertraline 150, lamotrigine 150 daily, Abilify 2.5 mg daily, Vyvanse 50 mg every morning She thinks Vyvanse needs increase again bc more ADHD paralysis, shorter fuse, less focus. Overall still feels benefit Vyvanse. Felicia Young Peyton good benefit with Concerta. No SE. Overall pleased with meds and balance.  Best mood than in years.  Looking forward to things.  Not struggling to get out of bed.  Happy. Working things out with Trinna Post and better for 3 weeks. Working on Pensions consultant.   Working back in [employer].   Plan: Ok increase Vyvanse 60 mg AM  10/24/22 appt noted: Psych meds:  Vyvanse 60 mg AM and Sertraline 150, lamotrigine 150 daily, Abilify 2.5 mg daily,  "The perfect med cocktail".   Circumstantially a lot of stress but pushing through.  Father's health declining.  Close to him.    He might have CA and that could be triggering.  Caused a lot of anxiety bc mind goes to worse case scenario. More stable and functioning with meds.  He's a caring and giving man.  Stay with parents one night per week and otherwise with Felicia's family.   Her B has moved in to help with father.  B moved from non-binary to taking hormones and is transgender and causing stress.  He does not push this on his Felicia Young.   Hard not being with cats.  Asks for filling out forms for emotional support animals.  Looking to rent a house.  They relax her.  Cats Lily Kocher and [employer].    Felicia Young was born via  emergency C-section November 18, 2017 and swallowed fluid resulting in hypoxia and a NICU stay of 5 days.  Patient had some PTSD symptoms thereafter but those have largely  resolved. 3 children.  Past Psychiatric Medication Trials:  Lexapro, viibryd 40 poor response,  Zoloft 150 Vyvanse 30 AM Depakote, Lamotrigine,                                                Increase the lamotrigine and sertraline to 150mg  each in January 2019. Was depressed at the visit in December but symptoms were resolved with the addition of Abilify 2.5 mg daily Note: Mother also with a similar pattern of mood disorder, bipolar type II.  M and B on Cymbalta.                    Review of Systems:  Review of Systems  Constitutional:  Positive for fatigue.  Gastrointestinal:  Positive for nausea. Negative for diarrhea.  Neurological:  Negative for tremors.  Psychiatric/Behavioral:  Positive for decreased concentration and depression. Negative for agitation, behavioral problems, confusion, dysphoric mood, hallucinations, self-injury, sleep disturbance and suicidal ideas. The patient is not nervous/anxious and is not hyperactive.     Medications: I have reviewed the patient's current medications.  Current Outpatient Medications  Medication Sig Dispense Refill   lamoTRIgine (LAMICTAL) 150 MG tablet Take 1 tablet (150 mg total) by mouth daily. 90 tablet 0   [START ON 11/21/2022] lisdexamfetamine (VYVANSE) 60 MG capsule Take 1 capsule (60 mg total) by mouth every morning. 30 capsule 0   [START ON 12/19/2022] lisdexamfetamine (VYVANSE) 60 MG capsule Take 1 capsule (60 mg total) by mouth every morning. 30 capsule 0   ARIPiprazole (ABILIFY) 5 MG tablet Take 1 tablet (5 mg total) by mouth daily. 90 tablet 1   lisdexamfetamine (VYVANSE) 60 MG capsule Take 1 capsule (60 mg total) by mouth daily. 30 capsule 0   sertraline (ZOLOFT) 100 MG tablet Take 1.5 tablets (150 mg total) by mouth daily. 135 tablet 1   No current  facility-administered medications for this visit.    Medication Side Effects: None, sexual SE  Allergies: No Known Allergies  Past Medical History:  Diagnosis Date   ADHD    ALLERGIC RHINITIS    seasonal   Depression    Dyslipidemia 01/11/2013   CPX 01/2013 dx - Recommend diet/aerobic exercise changes with weight reduction   Exercise-induced asthma    Generalized anxiety disorder    Migraine    PCOS (polycystic ovarian syndrome)     Family History  Problem Relation Age of Onset   Hypertension Mother    Arthritis Mother    Arthritis Father    Alcohol abuse Maternal Grandmother    Hyperlipidemia Maternal Grandmother    Stroke Maternal Grandmother    Hyperlipidemia Maternal Grandfather    Hyperlipidemia Paternal Grandmother    Hyperlipidemia Paternal Grandfather    Hypertension Paternal Grandfather    Mother also has bipolar disorder type II plus anxiety Social History   Socioeconomic History   Marital status: Married    Spouse name: Not on file   Number of children: Not on file   Years of education: Not on file   Highest education level: Not on file  Occupational History   Not on file  Tobacco Use   Smoking status: Former    Types: Cigarettes   Smokeless tobacco: Never  Vaping Use   Vaping status: Former   Quit date: 10/03/2021   Substances: Nicotine  Substance and Sexual Activity  Alcohol use: No   Drug use: No   Sexual activity: Not on file  Other Topics Concern   Not on file  Social History Narrative   Not on file   Social Determinants of Health   Financial Resource Strain: Not on file  Food Insecurity: No Food Insecurity (05/17/2022)   Hunger Vital Sign    Worried About Running Out of Food in the Last Year: Never true    Ran Out of Food in the Last Year: Never true  Transportation Needs: No Transportation Needs (05/17/2022)   PRAPARE - Administrator, Civil Service (Medical): No    Lack of Transportation (Non-Medical): No  Physical  Activity: Not on file  Stress: Not on file  Social Connections: Not on file  Intimate Partner Violence: Not At Risk (05/17/2022)   Humiliation, Afraid, Rape, and Kick questionnaire    Fear of Current or Ex-Partner: No    Emotionally Abused: No    Physically Abused: No    Sexually Abused: No    Past Medical History, Surgical history, Social history, and Family history were reviewed and updated as appropriate.   Please see review of systems for further details on the patient's review from today.   Objective:   Physical Exam:  There were no vitals taken for this visit.  Physical Exam Constitutional:      General: She is not in acute distress. Musculoskeletal:        General: No deformity.  Neurological:     Mental Status: She is alert and oriented to person, place, and time.     Cranial Nerves: No dysarthria.     Coordination: Coordination normal.  Psychiatric:        Attention and Perception: Perception normal. She is inattentive. She does not perceive auditory or visual hallucinations.        Mood and Affect: Mood is anxious. Mood is not depressed. Affect is tearful. Affect is not labile, blunt or inappropriate.        Speech: Speech normal.        Behavior: Behavior normal. Behavior is not agitated. Behavior is cooperative.        Thought Content: Thought content normal. Thought content is not paranoid or delusional. Thought content does not include homicidal or suicidal ideation. Thought content does not include suicidal plan.        Cognition and Memory: Cognition and memory normal.        Judgment: Judgment normal.     Comments: No manic symptoms.  Insight good.   Talkative Tearful about family things.      Lab Review:     Component Value Date/Time   NA 138 01/21/2015 1051   K 4.4 01/21/2015 1051   CL 105 01/21/2015 1051   CO2 25 01/21/2015 1051   GLUCOSE 82 01/21/2015 1051   BUN 13 01/21/2015 1051   CREATININE 0.64 01/21/2015 1051   CALCIUM 9.6 01/21/2015 1051    PROT 7.6 01/21/2015 1051   ALBUMIN 4.1 01/21/2015 1051   AST 12 01/21/2015 1051   ALT 15 01/21/2015 1051   ALKPHOS 42 01/21/2015 1051   BILITOT 0.4 01/21/2015 1051   GFRNONAA >90 10/22/2013 2000   GFRAA >90 10/22/2013 2000       Component Value Date/Time   WBC 12.2 (Felicia) 05/18/2022 0640   RBC 2.93 (L) 05/18/2022 0640   HGB 8.0 (L) 05/18/2022 0640   HCT 24.6 (L) 05/18/2022 0640   PLT 184 05/18/2022 0640   MCV  84.0 05/18/2022 0640   MCH 27.3 05/18/2022 0640   MCHC 32.5 05/18/2022 0640   RDW 12.9 05/18/2022 0640   LYMPHSABS 2.5 01/21/2015 1051   MONOABS 0.7 01/21/2015 1051   EOSABS 0.1 01/21/2015 1051   BASOSABS 0.0 01/21/2015 1051    No results found for: "POCLITH", "LITHIUM"   No results found for: "PHENYTOIN", "PHENOBARB", "VALPROATE", "CBMZ"   .res Assessment: Plan:    Bipolar II disorder (HCC) - Plan: ARIPiprazole (ABILIFY) 5 MG tablet  Generalized anxiety disorder - Plan: sertraline (ZOLOFT) 100 MG tablet  PTSD (post-traumatic stress disorder) - Plan: sertraline (ZOLOFT) 100 MG tablet  Attention deficit hyperactivity disorder (ADHD), combined type - Plan: lisdexamfetamine (VYVANSE) 60 MG capsule, lisdexamfetamine (VYVANSE) 60 MG capsule, lisdexamfetamine (VYVANSE) 60 MG capsule  Marital disruption involving estrangement   ADHD ASRS scale 18 inattentive; 31 hyperactivity= high score before meds.  30 min face to face time with patient was spent on counseling and coordination of care. We discussed symptoms are well controlled at this time.  Tolerating meds well.   Disc the risk and SE.      Counseled patient regarding potential benefits, risks, and side effects of Lamictal to include potential risk of Stevens-Johnson syndrome. Advised patient to stop taking Lamictal and contact office immediately if rash develops and to seek urgent medical attention if rash is severe and/or spreading quickly.  Supportive therapy re: dealing with recent separation and post-partum  issues.   And dealing with parents dislike of Felicia.  Struggling with self-esteem.  Enc counseling with Felicia if possible.  Answered questions about EMDR. She plans to start marital counseling with a Set designer, Everlean Patterson RD.    Discussed potential benefits, risks, and side effects of stimulants with patient to include increased heart rate, palpitations, insomnia, increased anxiety, increased irritability, or decreased appetite.  Instructed patient to contact office if experiencing any significant tolerability issues. Disc risk mania in bipolar pts.  She wants to continue stimulant bc marked benefit  Disc dosing range and limit to max dose.  No SE. Disc shortages and withdrawal if missing the doses.  Bottle feeding.  Tried breast feeding and couldn't.    No med changes:  For depressive sx  Abilify 2.5 mg daily..  Disc dosing  Cont lamotrigine 150 mg daily Zoloft 150 mg daily.  For reactivity and depression risk.  Disc risk mood cycling.  She and mother have done well with it. Better with increase Vyvanse 60 mg AM  FU 3-4 mos  Meredith Staggers, MD, DFAPA   Please see After Visit Summary for patient specific instructions.  No future appointments.   No orders of the defined types were placed in this encounter.      -------------------------------

## 2022-11-04 ENCOUNTER — Other Ambulatory Visit (HOSPITAL_COMMUNITY): Payer: Self-pay

## 2022-11-04 ENCOUNTER — Other Ambulatory Visit (HOSPITAL_BASED_OUTPATIENT_CLINIC_OR_DEPARTMENT_OTHER): Payer: Self-pay

## 2022-12-08 ENCOUNTER — Telehealth: Payer: Self-pay | Admitting: Psychiatry

## 2022-12-08 ENCOUNTER — Other Ambulatory Visit (HOSPITAL_COMMUNITY): Payer: Self-pay

## 2022-12-08 ENCOUNTER — Other Ambulatory Visit: Payer: Self-pay

## 2022-12-08 DIAGNOSIS — F902 Attention-deficit hyperactivity disorder, combined type: Secondary | ICD-10-CM

## 2022-12-08 MED ORDER — LISDEXAMFETAMINE DIMESYLATE 60 MG PO CAPS
60.0000 mg | ORAL_CAPSULE | ORAL | 0 refills | Status: DC
Start: 2023-01-05 — End: 2023-03-27
  Filled 2023-02-09: qty 30, 30d supply, fill #0

## 2022-12-08 MED ORDER — LISDEXAMFETAMINE DIMESYLATE 60 MG PO CAPS
60.0000 mg | ORAL_CAPSULE | ORAL | 0 refills | Status: DC
Start: 2022-12-08 — End: 2023-03-27
  Filled 2022-12-08 – 2023-03-11 (×2): qty 30, 30d supply, fill #0

## 2022-12-08 NOTE — Telephone Encounter (Signed)
Patient called in for refill on Vyvanse 60mg . Ph: [phone] appt 10/23 Pharmacy  [facility] [id number] [place]

## 2022-12-08 NOTE — Telephone Encounter (Signed)
Pended.

## 2022-12-09 ENCOUNTER — Other Ambulatory Visit (HOSPITAL_COMMUNITY): Payer: Self-pay

## 2023-01-06 ENCOUNTER — Other Ambulatory Visit: Payer: Self-pay | Admitting: Psychiatry

## 2023-01-06 ENCOUNTER — Other Ambulatory Visit (HOSPITAL_COMMUNITY): Payer: Self-pay

## 2023-01-06 DIAGNOSIS — F902 Attention-deficit hyperactivity disorder, combined type: Secondary | ICD-10-CM

## 2023-01-06 MED ORDER — LISDEXAMFETAMINE DIMESYLATE 60 MG PO CAPS
60.0000 mg | ORAL_CAPSULE | Freq: Every day | ORAL | 0 refills | Status: DC
Start: 2023-01-06 — End: 2023-03-27
  Filled 2023-01-06: qty 30, 30d supply, fill #0

## 2023-01-06 NOTE — Telephone Encounter (Signed)
RF appropriate; lv 10/24/22; nv 01/25/23 LF 0905/24

## 2023-01-25 ENCOUNTER — Ambulatory Visit: Payer: BC Managed Care – PPO | Admitting: Psychiatry

## 2023-02-09 ENCOUNTER — Other Ambulatory Visit (HOSPITAL_COMMUNITY): Payer: Self-pay

## 2023-03-11 ENCOUNTER — Other Ambulatory Visit (HOSPITAL_COMMUNITY): Payer: Self-pay

## 2023-03-13 ENCOUNTER — Other Ambulatory Visit (HOSPITAL_COMMUNITY): Payer: Self-pay

## 2023-03-27 ENCOUNTER — Encounter: Payer: Self-pay | Admitting: Psychiatry

## 2023-03-27 ENCOUNTER — Ambulatory Visit: Payer: BC Managed Care – PPO | Admitting: Psychiatry

## 2023-03-27 ENCOUNTER — Other Ambulatory Visit (HOSPITAL_COMMUNITY): Payer: Self-pay

## 2023-03-27 DIAGNOSIS — F3181 Bipolar II disorder: Secondary | ICD-10-CM

## 2023-03-27 DIAGNOSIS — F902 Attention-deficit hyperactivity disorder, combined type: Secondary | ICD-10-CM

## 2023-03-27 DIAGNOSIS — F411 Generalized anxiety disorder: Secondary | ICD-10-CM | POA: Diagnosis not present

## 2023-03-27 DIAGNOSIS — F4310 Post-traumatic stress disorder, unspecified: Secondary | ICD-10-CM

## 2023-03-27 MED ORDER — LISDEXAMFETAMINE DIMESYLATE 70 MG PO CAPS: 70.0000 mg | ORAL_CAPSULE | Freq: Every day | ORAL | 0 refills | Status: DC

## 2023-03-27 MED ORDER — LISDEXAMFETAMINE DIMESYLATE 70 MG PO CAPS: 70.0000 mg | ORAL_CAPSULE | ORAL | 0 refills | Status: DC

## 2023-03-27 MED ORDER — LISDEXAMFETAMINE DIMESYLATE 70 MG PO CAPS
70.0000 mg | ORAL_CAPSULE | ORAL | 0 refills | Status: DC
Start: 2023-03-27 — End: 2023-05-23
  Filled 2023-03-27 (×3): qty 30, 30d supply, fill #0

## 2023-03-27 NOTE — Progress Notes (Signed)
Felicia Young [id number] 06-16-1990 32 y.o.    Subjective:   [name] is a 32 y.o. (DOB [birth year]) female.  Chief Complaint:  No chief complaint on file.   Anxiety Symptoms include decreased concentration. Patient reports no confusion, nervous/anxious behavior or suicidal ideas.    Depression        Associated symptoms include decreased concentration and fatigue.  Associated symptoms include no suicidal ideas.  Past medical history includes anxiety.    Felicia Young presents to the office today for follow-up of Bipolar 2, and GAD with obsessional elements.    seen Nov 2020. Sertraline was reduced from 150 to 100 to reduce sexual SE.  Tooo irritable after a month.  Better with 150 mg daily.  Irritability generally managed.  08/15/2019 appointment with the following noted: Pretty good overall.  Taking Lamotrigine AM and noted fuse shorter and switched it to night and then insomnia.  Then switched lamotrigine and sertraline to AM and sleep and irritability are better now.  Less vivid dream which would before shake her up emotionally for a few days.  Periods of being overwhelmed still. Some days feels in quicksand and hard to get out of it and unmotivated and low energy.  Once going is OK.  Sometimes numbness to emotion esp re intimacy  With husband.  Low libido but doesn't interfere with pleasure in sex.   Anxiety pretty good except bothered by mask in a store.  Mostly DT the breathing issues. Zoloft increased postpartum related to Felicia Young's birth she had flashbacks.   Especially with 2 small kids. 2 weeks gone back to gym   Less afternoon slump.  Better energy and motivation. Sleep 8 hours. Plan: Due to sexual side effects attempt transition from sertraline to Viibryd.  09/12/2019 appointment select appointment phone call patient complaining of some tingling and dizziness immediately after transition to Viibryd 20 mg daily also with some sweating.  Did some  improvement in more normal emotionality.  Was felt to could potentially be some residual symptoms of serotonin withdrawal and to take a low dose of sertraline for 5 days and then stop it again.  09/27/2019 appointment with the following noted: Dizziness and tingly resolved with switch to Viibryd 20 and extra sertraline. Felicia Young is shorter than on Zoloft.  Quick to anger.  Positive however is feeling emotions otherwise more normally.  Tearful with happy things.  Less numbed.  More enjoyment and interest sexually but not a lot of motivation. More emotionally connected to husband. No mood swings. Feels more productive and energetic so far too. Plan: increasse Viibryd to 30 mg to help irritability  12/02/19 appt with the following noted: Since last appt had to do peer-peer appeal of denial of coverage for Viibryrd which was successful. Now on Viibryd 40 mg daily for about a month. Doing pretty good with it.  Feels pretty level and better than at lower dose with less anger and short-fused and less irritable.  Don't feel like a zombie or super numb but don't feel like she'll snap.  Better with numbness vs Zoloft.  Less sexual SE than Zoloft but kids interfere.   Not depressed.  Some anxiety.  Some worry over baby's breathing and will check repeatedly but usually can let go after a while. Plan no med changes  03/03/2020 appointment with the following noted: Called in Oct and wanted to swtich back to Zoloft bc irritability and lack of patience and weird dreams. Episode of sleep paralysis  when took it at night.  Happened 3 times with 3rd time the worst. On sertraline 100 irritability and NM resolved.  H noticed Zoloft was better too.   Much more level.   Son disturbs sleep.   At first it felt more normal to be able to cry but that benefit was not worth the rest with Viibryd. Plan no med changes.  06/29/2020 appointment with the following noted: Depression after Xmas.   Struggles with Felicia Young with ADHD  and psych med trials.  Got into place where felt alone and bad about herself and wanted to sleep a lot.   Not that dark in years.  A lot better now and happy now.  Felicia Young doing a lot better and pt feels better now too.  Wonders about increasing Zoloft to 200 bc did so when down and it seemed to help.  Feels more productive and happy with 200 mg daily and less triggered with mood.  More productive. Taking Zoloft 100 mg daily now. Plan: Increase Zoloft to 150 mg  .  For reactivity and depression risk.   Cont lamotrigine 150 mg daily  03/15/21 appt noted: On sertraline 150 mg since here.   Hard time just go through motions.  Hard to enjoy. Tired a lot.  Depression.   Low motivation and productivity.  Still some irritabilty.  Sleep is normal unless son gets her up. Anxiety less of a problem. Plan: Abilify faster vs duloxetine switch.  Abilify potentiation. 2.5  up to 5 in a few days if needed.  05/11/2021 appointment with the following noted: Abilify was the missing link and mother noticed benefit in a few days.   No longer feels like a shell and more like a person.  Better energy.  Better function.  Less guilt and negativity. No SE [name] travels rough when he's gone. D ADHD and suspected autism.  Pending evaluation.  Has OT.  Difficult. Felicia Young is 6 but has good memory. Patient reports stable mood and denies depressed or irritable moods.  Patient denies any recent difficulty with anxiety.  Patient denies difficulty with sleep initiation or maintenance. Denies appetite disturbance.  Patient reports that energy and motivation have been good.  Patient denies any difficulty with concentration.  Patient denies any suicidal ideation. Plan: No med changes.  Continue lamotrigine 150, Abilify 2.5, sertraline 150 mg daily  08/09/2021 appointment with the following noted: Doing pretty good.  No sig depression episodes.   Asks about ADHD.  Find it hard to get stuff done.  Task paralysis.  Initiative problems.  D  has ADHD.  Has trouble getting things finished for example painting rooms.  Can get overstimulated with sound.  Overwhelmed with tasks and finishing them. Not losing things.  Getting things organized is difficult.   Driving is OK bc fear accidents.  Can interrupt in conversations bc sense of urgency.   Had trouble with timed and other tests and would freeze up with getting overwhelmed. Fidgety.  Hard to sit and watch a movie without doing something else with her hands. Sleep 8 hours. B started Vyvanse with benefit.  H doesn't have ADHD. P;an: Vyvanse 30 mg AM  10/13/21 appt noted: No Abilify. On sertraline 100 and lamotrigine 150.  On PNV. Vyvanse changed my world.  Better function.  Less irritable.  Buffer now.  Less overwhelmed.  Mind quieter on Vyvanse. Pregnant as of July 1.  Talked to on call OB bc didn't want to give up the benefit. OB Ok'd lamotrigine  and sertraline and low dose Vyvanse.  So can stay on it. SE less appetite but is eating normally meals. First [place] tomorrow. Plan: No med changes.  Continue Vyvanse 30 mg every morning, sertraline 100 mg daily, lamotrigine 150 mg daily  01/13/22 appt noted: Med wise OK. Pregnant and misses Abilify and doesn't feels she can go higher on Vyvanse.  Enough to get through pregnancy but looks forward to getting back on normal doses and increasing Vyvanse. Misses Abilify enhancing Zoloft.  Higher doses Zoloft don't help. Stress at home.  Marital issues which H doesn't see.   Pregnant [redacted] weeks.  C-section sched 05/17/22. No problems so far.  Boy. Exhausted with pregnancy and nausea with food aversions.  Not weighed herself in a couple of weeks.  Lost 10# since beginning of pregnancy. More pt on vyvanse than before it.  06/27/22 appt noted: On sertraline 100 and lamotrigine 150, Vyvanse 30 AM Son born 6 weeks ago, Felicia Young.  Had 3rd C section.  More painful this time.  Better now.   D Petyon autistic and attached.  Son Felicia Young doing well with it  too. Mood "hard time" with separation from narcissistic H and dealing with it.  H has a GF.  Stress with credit card debt, got sued over it.  Father  paid it off.  Son's cat died abruptly.   Working on herself spiritually.  Has tried to reconcile with H.   Thinks felet better with Abilify and and sertraline 150 with other meds. Plan: Ok to increase Vyvanse 40 mg AM Agree with increase sertraline 150 mg daily. Continue lamotrigine 150  TC 07/22/22: asked to increase Vyvanse 50.  Ok.   08/24/22 appt noted: Psych meds: Sertraline 150, lamotrigine 150 daily, Abilify 2.5 mg daily, Vyvanse 50 mg every morning She thinks Vyvanse needs increase again bc more ADHD paralysis, shorter fuse, less focus. Overall still feels benefit Vyvanse. D Felicia Young good benefit with Concerta. No SE. Overall pleased with meds and balance.  Best mood than in years.  Looking forward to things.  Not struggling to get out of bed.  Happy. Working things out with Felicia Young and better for 3 weeks. Working on Pensions consultant.   Working back in [employer].   Plan: Ok increase Vyvanse 60 mg AM  10/24/22 appt noted: Psych meds:  Vyvanse 60 mg AM and Sertraline 150, lamotrigine 150 daily, Abilify 2.5 mg daily,  "The perfect med cocktail".   Circumstantially a lot of stress but pushing through.  Father's health declining.  Close to him.    He might have CA and that could be triggering.  Caused a lot of anxiety bc mind goes to worse case scenario. More stable and functioning with meds.  He's a caring and giving man.  Stay with parents one night per week and otherwise with H's family.   Her B has moved in to help with father.  B moved from non-binary to taking hormones and is transgender and causing stress.  He does not push this on his D.   Hard not being with cats.  Asks for filling out forms for emotional support animals.  Looking to rent a house.  They relax her.  Cats Felicia Young and Felicia Young.   Felicia Young was born via emergency C-section November 18, 2017  and swallowed fluid resulting in hypoxia and a NICU stay of 5 days.  Patient had some PTSD symptoms thereafter but those have largely resolved. 3 children.  03/27/23 appt noted: She and Felicia Young, and  family, got home just recently after H's uncle died. Overall good but ADHD paralysis creeping back in.  Vyvanse helps her be less overwhelmed.  Asks to increase Vyvanse to 70.  No SE. Mood stable.  Vyanse changed her life.   Satisfied by other meds. Stressors F cancer, B transgender recently  getting divorced from his wife..    Past Psychiatric Medication Trials:  Lexapro, viibryd 40 poor response,  Zoloft 150 Vyvanse 30 AM Depakote, Lamotrigine,                                                Increase the lamotrigine and sertraline to 150mg  each in January 2019. Was depressed at the visit in December but symptoms were resolved with the addition of Abilify 2.5 mg daily Note: Mother also with a similar pattern of mood disorder, bipolar type II.  M and B on Cymbalta.                    Review of Systems:  Review of Systems  Constitutional:  Positive for fatigue.  Gastrointestinal:  Negative for diarrhea.  Neurological:  Negative for tremors.  Psychiatric/Behavioral:  Positive for decreased concentration. Negative for agitation, behavioral problems, confusion, dysphoric mood, hallucinations, self-injury, sleep disturbance and suicidal ideas. The patient is not nervous/anxious and is not hyperactive.     Medications: I have reviewed the patient's current medications.  Current Outpatient Medications  Medication Sig Dispense Refill   ARIPiprazole (ABILIFY) 5 MG tablet Take 1 tablet (5 mg total) by mouth daily. 90 tablet 1   lamoTRIgine (LAMICTAL) 150 MG tablet Take 1 tablet (150 mg total) by mouth daily. 90 tablet 0   sertraline (ZOLOFT) 100 MG tablet Take 1.5 tablets (150 mg total) by mouth daily. 135 tablet 1   lisdexamfetamine (VYVANSE) 70 MG capsule Take 1 capsule (70 mg total) by mouth every  morning. 30 capsule 0   [START ON 04/24/2023] lisdexamfetamine (VYVANSE) 70 MG capsule Take 1 capsule (70 mg total) by mouth daily. 30 capsule 0   [START ON 05/22/2023] lisdexamfetamine (VYVANSE) 70 MG capsule Take 1 capsule (70 mg total) by mouth every morning. 30 capsule 0   No current facility-administered medications for this visit.    Medication Side Effects: None, sexual SE  Allergies: No Known Allergies  Past Medical History:  Diagnosis Date   ADHD    ALLERGIC RHINITIS    seasonal   Depression    Dyslipidemia 01/11/2013   CPX 01/2013 dx - Recommend diet/aerobic exercise changes with weight reduction   Exercise-induced asthma    Generalized anxiety disorder    Migraine    PCOS (polycystic ovarian syndrome)     Family History  Problem Relation Age of Onset   Hypertension Mother    Arthritis Mother    Arthritis Father    Alcohol abuse Maternal Grandmother    Hyperlipidemia Maternal Grandmother    Stroke Maternal Grandmother    Hyperlipidemia Maternal Grandfather    Hyperlipidemia Paternal Grandmother    Hyperlipidemia Paternal Grandfather    Hypertension Paternal Grandfather    Mother also has bipolar disorder type II plus anxiety Social History   Socioeconomic History   Marital status: Married    Spouse name: Not on file   Number of children: Not on file   Years of education: Not on file   Highest education level:  Not on file  Occupational History   Not on file  Tobacco Use   Smoking status: Former    Types: Cigarettes   Smokeless tobacco: Never  Vaping Use   Vaping status: Former   Quit date: 10/03/2021   Substances: Nicotine  Substance and Sexual Activity   Alcohol use: No   Drug use: No   Sexual activity: Not on file  Other Topics Concern   Not on file  Social History Narrative   Not on file   Social Drivers of Health   Financial Resource Strain: Not on file  Food Insecurity: No Food Insecurity (05/17/2022)   Hunger Vital Sign    Worried About  Running Out of Food in the Last Year: Never true    Ran Out of Food in the Last Year: Never true  Transportation Needs: No Transportation Needs (05/17/2022)   PRAPARE - Administrator, Civil Service (Medical): No    Lack of Transportation (Non-Medical): No  Physical Activity: Not on file  Stress: Not on file  Social Connections: Not on file  Intimate Partner Violence: Not At Risk (05/17/2022)   Humiliation, Afraid, Rape, and Kick questionnaire    Fear of Current or Ex-Partner: No    Emotionally Abused: No    Physically Abused: No    Sexually Abused: No    Past Medical History, Surgical history, Social history, and Family history were reviewed and updated as appropriate.   Please see review of systems for further details on the patient's review from today.   Objective:   Physical Exam:  There were no vitals taken for this visit.  Physical Exam Constitutional:      General: She is not in acute distress. Musculoskeletal:        General: No deformity.  Neurological:     Mental Status: She is alert and oriented to person, place, and time.     Cranial Nerves: No dysarthria.     Coordination: Coordination normal.  Psychiatric:        Attention and Perception: Perception normal. She is inattentive. She does not perceive auditory or visual hallucinations.        Mood and Affect: Mood is anxious. Mood is not depressed. Affect is tearful. Affect is not labile, blunt or inappropriate.        Speech: Speech normal.        Behavior: Behavior normal. Behavior is not agitated. Behavior is cooperative.        Thought Content: Thought content normal. Thought content is not paranoid or delusional. Thought content does not include homicidal or suicidal ideation. Thought content does not include suicidal plan.        Cognition and Memory: Cognition and memory normal.        Judgment: Judgment normal.     Comments: No manic symptoms.  Insight good.   Talkative Tearful about family  things.      Lab Review:     Component Value Date/Time   NA 138 01/21/2015 1051   K 4.4 01/21/2015 1051   CL 105 01/21/2015 1051   CO2 25 01/21/2015 1051   GLUCOSE 82 01/21/2015 1051   BUN 13 01/21/2015 1051   CREATININE 0.64 01/21/2015 1051   CALCIUM 9.6 01/21/2015 1051   PROT 7.6 01/21/2015 1051   ALBUMIN 4.1 01/21/2015 1051   AST 12 01/21/2015 1051   ALT 15 01/21/2015 1051   ALKPHOS 42 01/21/2015 1051   BILITOT 0.4 01/21/2015 1051   GFRNONAA >90 10/22/2013  2000   GFRAA >90 10/22/2013 2000       Component Value Date/Time   WBC 12.2 (H) 05/18/2022 0640   RBC 2.93 (L) 05/18/2022 0640   HGB 8.0 (L) 05/18/2022 0640   HCT 24.6 (L) 05/18/2022 0640   PLT 184 05/18/2022 0640   MCV 84.0 05/18/2022 0640   MCH 27.3 05/18/2022 0640   MCHC 32.5 05/18/2022 0640   RDW 12.9 05/18/2022 0640   LYMPHSABS 2.5 01/21/2015 1051   MONOABS 0.7 01/21/2015 1051   EOSABS 0.1 01/21/2015 1051   BASOSABS 0.0 01/21/2015 1051    No results found for: "POCLITH", "LITHIUM"   No results found for: "PHENYTOIN", "PHENOBARB", "VALPROATE", "CBMZ"   .res Assessment: Plan:    Bipolar II disorder (HCC)  Generalized anxiety disorder  PTSD (Young-traumatic stress disorder)  Attention deficit hyperactivity disorder (ADHD), combined type - Plan: lisdexamfetamine (VYVANSE) 70 MG capsule, lisdexamfetamine (VYVANSE) 70 MG capsule, lisdexamfetamine (VYVANSE) 70 MG capsule   ADHD ASRS scale 18 inattentive; 31 hyperactivity= high score before meds.  30 min face to face time with patient was spent on counseling and coordination of care. We discussed symptoms are well controlled at this time.  Tolerating meds well.   Disc the risk and SE.      Counseled patient regarding potential benefits, risks, and side effects of Lamictal to include potential risk of Stevens-Johnson syndrome. Advised patient to stop taking Lamictal and contact office immediately if rash develops and to seek urgent medical attention if  rash is severe and/or spreading quickly.  Counseling 20 min around family stressors:  Stress with B turning transgender. And also F's cancer.   marital counseling with a Set designer, Felicia Young RD.    Discussed potential benefits, risks, and side effects of stimulants with patient to include increased heart rate, palpitations, insomnia, increased anxiety, increased irritability, or decreased appetite.  Instructed patient to contact office if experiencing any significant tolerability issues. Disc risk mania in bipolar pts.  She wants to continue stimulant bc marked benefit  Disc dosing range and limit to max dose.  No SE. Disc the purpose and goals of a stimulant and what to expect.  Bottle feeding.  Tried breast feeding and couldn't.    For depressive sx  Abilify 2.5 mg daily..  Disc dosing  Cont lamotrigine 150 mg daily Zoloft 150 mg daily.  For reactivity and depression risk.  Disc risk mood cycling.  She and mother have done well with it. Asks to increase to max Vyvanse 70 mg AM She understands that is the max.  FU 3-4 mos  Meredith Staggers, MD, DFAPA   Please see After Visit Summary for patient specific instructions.  No future appointments.   No orders of the defined types were placed in this encounter.      -------------------------------

## 2023-05-22 ENCOUNTER — Telehealth: Payer: Self-pay | Admitting: Psychiatry

## 2023-05-22 NOTE — Telephone Encounter (Signed)
With her last pregnancy, the OB was OK with 30 mg Vyvanse daily.  Ultimately it is OB decision but assuming things are the same we can send in the 30 mg dose.  I don't know of any medical reasons why 40 mg  would not be reasonable, but she needs to clear that with OB.

## 2023-05-22 NOTE — Telephone Encounter (Signed)
Patient reporting she is about [redacted] weeks pregnant.  She wants to know what dose of Vyvanse is safe to take. At 12/23 visit she was asking to increase Vyvanse to 70 mg.  She reports she had some 60 mg capsules left and was opening them and taking approximately 1/2 of the capsule. She reports doing this for 2 weeks.

## 2023-05-22 NOTE — Telephone Encounter (Signed)
Pt called and said that she is pregnant. She needs to know what strength of the vyvanse she is allowed to take during her pregnancy. Please call her at [phone]

## 2023-05-23 ENCOUNTER — Other Ambulatory Visit: Payer: Self-pay | Admitting: Psychiatry

## 2023-05-23 ENCOUNTER — Other Ambulatory Visit (HOSPITAL_COMMUNITY): Payer: Self-pay

## 2023-05-23 ENCOUNTER — Other Ambulatory Visit: Payer: Self-pay

## 2023-05-23 DIAGNOSIS — F902 Attention-deficit hyperactivity disorder, combined type: Secondary | ICD-10-CM

## 2023-05-23 MED ORDER — LISDEXAMFETAMINE DIMESYLATE 30 MG PO CAPS
30.0000 mg | ORAL_CAPSULE | Freq: Every day | ORAL | 0 refills | Status: DC
Start: 2023-05-23 — End: 2023-06-30
  Filled 2023-05-23 – 2023-05-24 (×2): qty 30, 30d supply, fill #0

## 2023-05-23 NOTE — Telephone Encounter (Signed)
Pended Vyvanse 30 mg to [facility] on [address].

## 2023-05-23 NOTE — Telephone Encounter (Signed)
Patient notified of Dr. Alwyn Ren response. She said she has an appt with OB in 2 weeks. Asks that Rx for 30 mg Vyvanse be sent to [facility] on [address].

## 2023-05-24 ENCOUNTER — Telehealth: Payer: Self-pay | Admitting: Psychiatry

## 2023-05-24 ENCOUNTER — Other Ambulatory Visit (HOSPITAL_COMMUNITY): Payer: Self-pay

## 2023-05-24 ENCOUNTER — Encounter (HOSPITAL_COMMUNITY): Payer: Self-pay

## 2023-05-24 NOTE — Telephone Encounter (Signed)
Felicia Young called at 2:08 to report that the pharmacy told her they couldn't fill the new Vyvanse prescription without further confirmation/authorization from the dr because she isn't due for a refill.  Even though this is a different lower dose due to her pregnancy.  Please contact the pharmacy -  [place] [facility]

## 2023-05-24 NOTE — Telephone Encounter (Signed)
Pharmacy said RF was ready but cost is $200. Patient said her [facility] requires an override. Pharmacy said Medicaid requires brand. They changed RF to brand and it went thru at no charge. Patient notified.

## 2023-06-05 LAB — OB RESULTS CONSOLE GC/CHLAMYDIA
Chlamydia: NEGATIVE
Neisseria Gonorrhea: NEGATIVE

## 2023-06-05 LAB — OB RESULTS CONSOLE HIV ANTIBODY (ROUTINE TESTING): HIV: NONREACTIVE

## 2023-06-05 LAB — HEPATITIS C ANTIBODY: HCV Ab: NEGATIVE

## 2023-06-05 LAB — OB RESULTS CONSOLE RUBELLA ANTIBODY, IGM: Rubella: IMMUNE

## 2023-06-05 LAB — OB RESULTS CONSOLE RPR: RPR: NONREACTIVE

## 2023-06-05 LAB — OB RESULTS CONSOLE HEPATITIS B SURFACE ANTIGEN: Hepatitis B Surface Ag: NEGATIVE

## 2023-06-20 ENCOUNTER — Telehealth: Payer: Self-pay | Admitting: Psychiatry

## 2023-06-20 NOTE — Telephone Encounter (Signed)
 Pt lvm that she is pregnant and that her OB GYNN  put her on a medicine that can react to her sertraline. It is called regalan. Please call her and see if there is a drug interaction. Her number is [phone]

## 2023-06-20 NOTE — Telephone Encounter (Signed)
 Current psych med: Abilify 5 , sertraline 150, lamotrigine 150, Vyvanse.  When pregnant before took sertraline 100 and lamotrigine 150 and Vyvanse 30.  No Abilify.  I assume OB and she would favor the same approach this time.  If so stop Abilify and reduce sertraline to 100 mg daily .  They concern about .  It is likely that the concern about Reglan with sertraline is the risk of serotonin syndrome.  That is extremely remote and unlikely.  Mild symptoms that would suggest a concern would include tremor and sweating so if those occur stop the Reglan which will get out of her body quickly.  I have a little more concern if she were to continue using Abilify with Reglan.  The risk there is not serotonin syndrome but more the risk of movement disorder problems like tremors.  Also I wonder about the theoretical risk of the Abilify actually interfering with the antinausea effects of Reglan which I assume is the purpose of the Reglan.  So I think she should stop the Abilify while she takes Reglan.

## 2023-06-20 NOTE — Telephone Encounter (Signed)
 Patient had questions about possible interaction with Reglan prescribed by her OB and sertraline. She said she called her pharmacy and was told to call us.

## 2023-06-21 NOTE — Telephone Encounter (Signed)
 Patient notified of recommendations and to stop Abilify. She said she found out the end of January she was pregnant and remembered last pregnancy had discontinued Abilify so she stopped it already.

## 2023-06-30 ENCOUNTER — Other Ambulatory Visit: Payer: Self-pay | Admitting: Psychiatry

## 2023-06-30 ENCOUNTER — Other Ambulatory Visit (HOSPITAL_COMMUNITY): Payer: Self-pay

## 2023-06-30 MED ORDER — LISDEXAMFETAMINE DIMESYLATE 30 MG PO CAPS
30.0000 mg | ORAL_CAPSULE | Freq: Every day | ORAL | 0 refills | Status: DC
Start: 2023-06-30 — End: 2023-07-31
  Filled 2023-06-30: qty 30, 30d supply, fill #0

## 2023-07-04 ENCOUNTER — Other Ambulatory Visit: Payer: Self-pay | Admitting: Psychiatry

## 2023-07-04 DIAGNOSIS — F3181 Bipolar II disorder: Secondary | ICD-10-CM

## 2023-07-31 ENCOUNTER — Other Ambulatory Visit (HOSPITAL_BASED_OUTPATIENT_CLINIC_OR_DEPARTMENT_OTHER): Payer: Self-pay

## 2023-07-31 ENCOUNTER — Ambulatory Visit (INDEPENDENT_AMBULATORY_CARE_PROVIDER_SITE_OTHER): Payer: BC Managed Care – PPO | Admitting: Psychiatry

## 2023-07-31 ENCOUNTER — Encounter: Payer: Self-pay | Admitting: Psychiatry

## 2023-07-31 DIAGNOSIS — F3181 Bipolar II disorder: Secondary | ICD-10-CM

## 2023-07-31 DIAGNOSIS — F4310 Post-traumatic stress disorder, unspecified: Secondary | ICD-10-CM

## 2023-07-31 DIAGNOSIS — F902 Attention-deficit hyperactivity disorder, combined type: Secondary | ICD-10-CM

## 2023-07-31 DIAGNOSIS — F411 Generalized anxiety disorder: Secondary | ICD-10-CM | POA: Diagnosis not present

## 2023-07-31 MED ORDER — LISDEXAMFETAMINE DIMESYLATE 30 MG PO CAPS
30.0000 mg | ORAL_CAPSULE | Freq: Every day | ORAL | 0 refills | Status: DC
  Filled 2023-10-16: qty 30, 30d supply, fill #0

## 2023-07-31 MED ORDER — LISDEXAMFETAMINE DIMESYLATE 30 MG PO CAPS
30.0000 mg | ORAL_CAPSULE | Freq: Every day | ORAL | 0 refills | Status: DC
  Filled 2023-07-31: qty 30, 30d supply, fill #0

## 2023-07-31 MED ORDER — LAMOTRIGINE 150 MG PO TABS
150.0000 mg | ORAL_TABLET | Freq: Every day | ORAL | 0 refills | Status: DC
Start: 2023-07-31 — End: 2024-02-05

## 2023-07-31 MED ORDER — SERTRALINE HCL 100 MG PO TABS: 150.0000 mg | ORAL_TABLET | Freq: Every day | ORAL | 1 refills | Status: AC

## 2023-07-31 MED ORDER — LISDEXAMFETAMINE DIMESYLATE 30 MG PO CAPS
30.0000 mg | ORAL_CAPSULE | Freq: Every day | ORAL | 0 refills | Status: DC
Start: 2023-08-28 — End: 2023-11-22
  Filled 2023-09-06: qty 30, 30d supply, fill #0

## 2023-07-31 NOTE — Progress Notes (Signed)
 [name] [id number] 1990/10/06 33 y.o.    Subjective:   [name] is a 33 y.o. (DOB [birth year]) female.  Chief Complaint:  Chief Complaint  Patient presents with   Follow-up   Depression   Anxiety   ADD   Medication Reaction    Felicia Young presents to the office today for follow-up of Bipolar 2, and GAD with obsessional elements.    seen Nov 2020. Sertraline  was reduced from 150 to 100 to reduce sexual SE.  Tooo irritable after a month.  Better with 150 mg daily.  Irritability generally managed.  08/15/2019 appointment with the following noted: Pretty good overall.  Taking Lamotrigine  AM and noted fuse shorter and switched it to night and then insomnia.  Then switched lamotrigine  and sertraline  to AM and sleep and irritability are better now.  Less vivid dream which would before shake her up emotionally for a few days.  Periods of being overwhelmed still. Some days feels in quicksand and hard to get out of it and unmotivated and low energy.  Once going is OK.  Sometimes numbness to emotion esp re intimacy  With husband.  Low libido but doesn't interfere with pleasure in sex.   Anxiety pretty good except bothered by mask in a store.  Mostly DT the breathing issues. Zoloft  increased postpartum related to [name] birth she had flashbacks.   Especially with 2 small kids. 2 weeks gone back to gym   Less afternoon slump.  Better energy and motivation. Sleep 8 hours. Plan: Due to sexual side effects attempt transition from sertraline  to Viibryd .  09/12/2019 appointment select appointment phone call patient complaining of some tingling and dizziness immediately after transition to Viibryd  20 mg daily also with some sweating.  Did some improvement in more normal emotionality.  Was felt to could potentially be some residual symptoms of serotonin withdrawal and to take a low dose of sertraline  for 5 days and then stop it again.  09/27/2019 appointment with the following  noted: Dizziness and tingly resolved with switch to Viibryd  20 and extra sertraline . Felicia Young is shorter than on Zoloft .  Quick to anger.  Positive however is feeling emotions otherwise more normally.  Tearful with happy things.  Less numbed.  More enjoyment and interest sexually but not a lot of motivation. More emotionally connected to husband. No mood swings. Feels more productive and energetic so far too. Plan: increasse Viibryd  to 30 mg to help irritability  12/02/19 appt with the following noted: Since last appt had to do peer-peer appeal of denial of coverage for Viibryrd which was successful. Now on Viibryd  40 mg daily for about a month. Doing pretty good with it.  Feels pretty level and better than at lower dose with less anger and short-fused and less irritable.  Don't feel like a zombie or super numb but don't feel like she'll snap.  Better with numbness vs Zoloft .  Less sexual SE than Zoloft  but kids interfere.   Not depressed.  Some anxiety.  Some worry over baby's breathing and will check repeatedly but usually can let go after a while. Plan no med changes  03/03/2020 appointment with the following noted: Called in Oct and wanted to swtich back to Zoloft  bc irritability and lack of patience and weird dreams. Episode of sleep paralysis when took it at night.  Happened 3 times with 3rd time the worst. On sertraline  100 irritability and NM resolved.  H noticed Zoloft  was better too.  Much more level.   Son disturbs sleep.   At first it felt more normal to be able to cry but that benefit was not worth the rest with Viibryd . Plan no med changes.  06/29/2020 appointment with the following noted: Depression after Xmas.   Struggles with Felicia Young with ADHD and psych med trials.  Got into place where felt alone and bad about herself and wanted to sleep a lot.   Not that dark in years.  A lot better now and happy now.  Felicia Young doing a lot better and pt feels better now too.  Wonders about  increasing Zoloft  to 200 bc did so when down and it seemed to help.  Feels more productive and happy with 200 mg daily and less triggered with mood.  More productive. Taking Zoloft  100 mg daily now. Plan: Increase Zoloft  to 150 mg  .  For reactivity and depression risk.   Cont lamotrigine  150 mg daily  03/15/21 appt noted: On sertraline  150 mg since here.   Hard time just go through motions.  Hard to enjoy. Tired a lot.  Depression.   Low motivation and productivity.  Still some irritabilty.  Sleep is normal unless son gets her up. Anxiety less of a problem. Plan: Abilify  faster vs duloxetine switch.  Abilify  potentiation. 2.5  up to 5 in a few days if needed.  05/11/2021 appointment with the following noted: Abilify  was the missing link and mother noticed benefit in a few days.   No longer feels like a shell and more like a person.  Better energy.  Better function.  Less guilt and negativity. No SE [name] travels rough when he's gone. D ADHD and suspected autism.  Pending evaluation.  Has OT.  Difficult. Felicia Young is 6 but has good memory. Patient reports stable mood and denies depressed or irritable moods.  Patient denies any recent difficulty with anxiety.  Patient denies difficulty with sleep initiation or maintenance. Denies appetite disturbance.  Patient reports that energy and motivation have been good.  Patient denies any difficulty with concentration.  Patient denies any suicidal ideation. Plan: No med changes.  Continue lamotrigine  150, Abilify  2.5, sertraline  150 mg daily  08/09/2021 appointment with the following noted: Doing pretty good.  No sig depression episodes.   Asks about ADHD.  Find it hard to get stuff done.  Task paralysis.  Initiative problems.  D has ADHD.  Has trouble getting things finished for example painting rooms.  Can get overstimulated with sound.  Overwhelmed with tasks and finishing them. Not losing things.  Getting things organized is difficult.   Driving is OK bc  fear accidents.  Can interrupt in conversations bc sense of urgency.   Had trouble with timed and other tests and would freeze up with getting overwhelmed. Fidgety.  Hard to sit and watch a movie without doing something else with her hands. Sleep 8 hours. B started Vyvanse  with benefit.  H doesn't have ADHD. P;an: Vyvanse  30 mg AM  10/13/21 appt noted: No Abilify . On sertraline  100 and lamotrigine  150.  On PNV. Vyvanse  changed my world.  Better function.  Less irritable.  Buffer now.  Less overwhelmed.  Mind quieter on Vyvanse . Pregnant as of July 1.  Talked to on call OB bc didn't want to give up the benefit. OB Ok'd lamotrigine  and sertraline  and low dose Vyvanse .  So can stay on it. SE less appetite but is eating normally meals. First US  tomorrow. Plan: No med changes.  Continue Vyvanse   30 mg every morning, sertraline  100 mg daily, lamotrigine  150 mg daily  01/13/22 appt noted: Med wise OK. Pregnant and misses Abilify  and doesn't feels she can go higher on Vyvanse .  Enough to get through pregnancy but looks forward to getting back on normal doses and increasing Vyvanse . Misses Abilify  enhancing Zoloft .  Higher doses Zoloft  don't help. Stress at home.  Marital issues which H doesn't see.   Pregnant [redacted] weeks.  C-section sched 05/17/22. No problems so far.  Boy. Exhausted with pregnancy and nausea with food aversions.  Not weighed herself in a couple of weeks.  Lost 10# since beginning of pregnancy. More pt on vyvanse  than before it.  06/27/22 appt noted: On sertraline  100 and lamotrigine  150, Vyvanse  30 AM Son born 6 weeks ago, Hilario Lover.  Had 3rd C section.  More painful this time.  Better now.   D Petyon autistic and attached.  Son [name] doing well with it too. Mood "hard time" with separation from narcissistic H and dealing with it.  H has a GF.  Stress with credit card debt, got sued over it.  Father  paid it off.  Son's cat died abruptly.   Working on herself spiritually.  Has tried  to reconcile with H.   Thinks felet better with Abilify  and and sertraline  150 with other meds. Plan: Ok to increase Vyvanse  40 mg AM Agree with increase sertraline  150 mg daily. Continue lamotrigine  150  TC 07/22/22: asked to increase Vyvanse  50.  Ok.   08/24/22 appt noted: Psych meds: Sertraline  150, lamotrigine  150 daily, Abilify  2.5 mg daily, Vyvanse  50 mg every morning She thinks Vyvanse  needs increase again bc more ADHD paralysis, shorter fuse, less focus. Overall still feels benefit Vyvanse . D Peyton good benefit with Concerta. No SE. Overall pleased with meds and balance.  Best mood than in years.  Looking forward to things.  Not struggling to get out of bed.  Happy. Working things out with Fabio Holts and better for 3 weeks. Working on Pensions consultant.   Working back in [employer].   Plan: Ok increase Vyvanse  60 mg AM  10/24/22 appt noted: Psych meds:  Vyvanse  60 mg AM and Sertraline  150, lamotrigine  150 daily, Abilify  2.5 mg daily,  "The perfect med cocktail".   Circumstantially a lot of stress but pushing through.  Father's health declining.  Close to him.    He might have CA and that could be triggering.  Caused a lot of anxiety bc mind goes to worse case scenario. More stable and functioning with meds.  He's a caring and giving man.  Stay with parents one night per week and otherwise with H's family.   Her B has moved in to help with father.  B moved from non-binary to taking hormones and is transgender and causing stress.  He does not push this on his D.   Hard not being with cats.  Asks for filling out forms for emotional support animals.  Looking to rent a house.  They relax her.  Cats Fabiola Holy and [employer].   [name] was born via emergency C-section November [birth year] 2016 and swallowed fluid resulting in hypoxia and a NICU stay of 5 days.  Patient had some PTSD symptoms thereafter but those have largely resolved. 3 children.  03/27/23 appt noted: She and H, [name], and family, got home just recently  after H's uncle died. Overall good but ADHD paralysis creeping back in.  Vyvanse  helps her be less overwhelmed.  Asks to increase Vyvanse  to  70.  No SE. Mood stable.  Vyanse changed her life.   Satisfied by other meds. Stressors F cancer, B transgender recently  getting divorced from his wife..   Plan: For depressive sx  Abilify  2.5 mg daily..  Disc dosing  Cont lamotrigine  150 mg daily Zoloft  150 mg daily.  For reactivity and depression risk.  Disc risk mood cycling.  She and mother have done well with it. Asks to increase to max Vyvanse  70 mg AM  05/22/23 TC:  Patient reporting she is about [redacted] weeks pregnant.  She wants to know what dose of Vyvanse  is safe to take. At 12/23 visit she was asking to increase Vyvanse  to 70 mg.  She reports she had some 60 mg capsules left and was opening them and taking approximately 1/2 of the capsule. She reports doing this for 2 weeks.      MD:  With her last pregnancy, the OB was OK with 30 mg Vyvanse  daily.  Ultimately it is OB decision but assuming things are the same we can send in the 30 mg dose.  I don't know of any medical reasons why 40 mg  would not be reasonable, but she needs to clear that with OB.     06/20/23 TC: re: stopped Abilify   07/31/23 appt noted:  Med:  lamotrigine  150, Vyvanse  30, sertraline  150, no Abilify , Reglan  10 AM 16 weeks.  EDC 10/10.  Pregnant with girl.  Some days feels bad physically but better than first trimester. No SE with meds and satisfied. Hilario Lover 34 yo. 2 boys and 1 girl.  [name] doing well too.  Boys doing well.   Felicia Young doing well with other kids, samuel and [name] 6 yo.  Felicia Young 33 yo.   D special needs Asberger's.   Doing [employer] in [place].  Helped her confidence. Doing well overall.  Not dep but ADHD is harder with less med.   OB appts good.  No htn.   Stress with parents selling home.  But normal emotions.     Past Psychiatric Medication Trials:  Lexapro, viibryd  40 poor response,  Zoloft  150 Vyvanse  30  AM Depakote, Lamotrigine ,                                                Increase the lamotrigine  and sertraline  to 150mg  each in January 2019. Was depressed at the visit in December but symptoms were resolved with the addition of Abilify  2.5 mg daily Note: Mother also with a similar pattern of mood disorder, bipolar type II.  M and B on Cymbalta.                    Review of Systems:  Review of Systems  Constitutional:  Positive for fatigue.  Gastrointestinal:  Negative for diarrhea.  Neurological:  Negative for tremors and weakness.  Psychiatric/Behavioral:  Positive for decreased concentration. Negative for agitation, behavioral problems, confusion, dysphoric mood, hallucinations, self-injury, sleep disturbance and suicidal ideas. The patient is not nervous/anxious and is not hyperactive.     Medications: I have reviewed the patient's current medications.  Current Outpatient Medications  Medication Sig Dispense Refill   [START ON 08/28/2023] lisdexamfetamine (VYVANSE ) 30 MG capsule Take 1 capsule (30 mg total) by mouth daily. 30 capsule 0   [START ON 09/25/2023] lisdexamfetamine (VYVANSE ) 30 MG capsule Take 1 capsule (30  mg total) by mouth daily. 30 capsule 0   lamoTRIgine  (LAMICTAL ) 150 MG tablet Take 1 tablet (150 mg total) by mouth daily. 90 tablet 0   lisdexamfetamine (VYVANSE ) 30 MG capsule Take 1 capsule (30 mg total) by mouth daily. 30 capsule 0   sertraline  (ZOLOFT ) 100 MG tablet Take 1.5 tablets (150 mg total) by mouth daily. 135 tablet 1   No current facility-administered medications for this visit.    Medication Side Effects: None, sexual SE  Allergies: No Known Allergies  Past Medical History:  Diagnosis Date   ADHD    ALLERGIC RHINITIS    seasonal   Depression    Dyslipidemia 01/11/2013   CPX 01/2013 dx - Recommend diet/aerobic exercise changes with weight reduction   Exercise-induced asthma    Generalized anxiety disorder    Migraine    PCOS (polycystic  ovarian syndrome)     Family History  Problem Relation Age of Onset   Hypertension Mother    Arthritis Mother    Arthritis Father    Alcohol abuse Maternal Grandmother    Hyperlipidemia Maternal Grandmother    Stroke Maternal Grandmother    Hyperlipidemia Maternal Grandfather    Hyperlipidemia Paternal Grandmother    Hyperlipidemia Paternal Grandfather    Hypertension Paternal Grandfather    Mother also has bipolar disorder type II plus anxiety Social History   Socioeconomic History   Marital status: Married    Spouse name: Not on file   Number of children: Not on file   Years of education: Not on file   Highest education level: Not on file  Occupational History   Not on file  Tobacco Use   Smoking status: Former    Types: Cigarettes   Smokeless tobacco: Never  Vaping Use   Vaping status: Former   Quit date: 10/03/2021   Substances: Nicotine  Substance and Sexual Activity   Alcohol use: No   Drug use: No   Sexual activity: Not on file  Other Topics Concern   Not on file  Social History Narrative   Not on file   Social Drivers of Health   Financial Resource Strain: Not on file  Food Insecurity: No Food Insecurity (05/17/2022)   Hunger Vital Sign    Worried About Running Out of Food in the Last Year: Never true    Ran Out of Food in the Last Year: Never true  Transportation Needs: No Transportation Needs (05/17/2022)   PRAPARE - Administrator, Civil Service (Medical): No    Lack of Transportation (Non-Medical): No  Physical Activity: Not on file  Stress: Not on file  Social Connections: Not on file  Intimate Partner Violence: Not At Risk (05/17/2022)   Humiliation, Afraid, Rape, and Kick questionnaire    Fear of Current or Ex-Partner: No    Emotionally Abused: No    Physically Abused: No    Sexually Abused: No    Past Medical History, Surgical history, Social history, and Family history were reviewed and updated as appropriate.   Please see  review of systems for further details on the patient's review from today.   Objective:   Physical Exam:  There were no vitals taken for this visit.  Physical Exam Constitutional:      General: She is not in acute distress. Musculoskeletal:        General: No deformity.  Neurological:     Mental Status: She is alert and oriented to person, place, and time.  Cranial Nerves: No dysarthria.     Coordination: Coordination normal.  Psychiatric:        Attention and Perception: Perception normal. She is inattentive. She does not perceive auditory or visual hallucinations.        Mood and Affect: Mood is anxious. Mood is not depressed. Affect is not labile, blunt, tearful or inappropriate.        Speech: Speech normal.        Behavior: Behavior normal. Behavior is not agitated. Behavior is cooperative.        Thought Content: Thought content normal. Thought content is not paranoid or delusional. Thought content does not include homicidal or suicidal ideation. Thought content does not include suicidal plan.        Cognition and Memory: Cognition and memory normal.        Judgment: Judgment normal.     Comments: No manic symptoms.  Insight good.   Talkative       Lab Review:     Component Value Date/Time   NA 138 01/21/2015 1051   K 4.4 01/21/2015 1051   CL 105 01/21/2015 1051   CO2 25 01/21/2015 1051   GLUCOSE 82 01/21/2015 1051   BUN 13 01/21/2015 1051   CREATININE 0.64 01/21/2015 1051   CALCIUM 9.6 01/21/2015 1051   PROT 7.6 01/21/2015 1051   ALBUMIN 4.1 01/21/2015 1051   AST 12 01/21/2015 1051   ALT 15 01/21/2015 1051   ALKPHOS 42 01/21/2015 1051   BILITOT 0.4 01/21/2015 1051   GFRNONAA >90 10/22/2013 2000   GFRAA >90 10/22/2013 2000       Component Value Date/Time   WBC 12.2 (H) 05/18/2022 0640   RBC 2.93 (L) 05/18/2022 0640   HGB 8.0 (L) 05/18/2022 0640   HCT 24.6 (L) 05/18/2022 0640   PLT 184 05/18/2022 0640   MCV 84.0 05/18/2022 0640   MCH 27.3 05/18/2022  0640   MCHC 32.5 05/18/2022 0640   RDW 12.9 05/18/2022 0640   LYMPHSABS 2.5 01/21/2015 1051   MONOABS 0.7 01/21/2015 1051   EOSABS 0.1 01/21/2015 1051   BASOSABS 0.0 01/21/2015 1051    No results found for: "POCLITH", "LITHIUM"   No results found for: "PHENYTOIN", "PHENOBARB", "VALPROATE", "CBMZ"   .res Assessment: Plan:    Bipolar II disorder (HCC) - Plan: lamoTRIgine  (LAMICTAL ) 150 MG tablet  Generalized anxiety disorder - Plan: sertraline  (ZOLOFT ) 100 MG tablet  PTSD (post-traumatic stress disorder) - Plan: sertraline  (ZOLOFT ) 100 MG tablet  Attention deficit hyperactivity disorder (ADHD), combined type - Plan: lisdexamfetamine (VYVANSE ) 30 MG capsule, lisdexamfetamine (VYVANSE ) 30 MG capsule, lisdexamfetamine (VYVANSE ) 30 MG capsule   ADHD ASRS scale 18 inattentive; 31 hyperactivity= high score before meds.  30 min face to face time with patient was spent on counseling and coordination of care. We discussed symptoms are well controlled at this time.  Tolerating meds well.   Disc the risk and SE.     Not dep and satisfied with meds except ADD worse with less Vyvanse  DT pregnancy.   Disc pregnancy risks with meds.   Counseled patient regarding potential benefits, risks, and side effects of Lamictal  to include potential risk of Stevens-Johnson syndrome. Advised patient to stop taking Lamictal  and contact office immediately if rash develops and to seek urgent medical attention if rash is severe and/or spreading quickly.  Previous marital counseling with a Set designer, Tula Gain RD.    Discussed potential benefits, risks, and side effects of stimulants with patient to include  increased heart rate, palpitations, insomnia, increased anxiety, increased irritability, or decreased appetite.  Instructed patient to contact office if experiencing any significant tolerability issues.  Disc risk pregnancy. Disc risk mania in bipolar pts.  She wants to continue stimulant  bc marked benefit  Disc dosing range and limit to max dose.  No SE. Disc the purpose and goals of a stimulant and what to expect.  Bottle feeding.  Tried breast feeding and couldn't.    Hold Abilify  DT pregnancy and Reglan . Cont lamotrigine  150 mg daily Zoloft  150 mg daily.  For reactivity and depression risk.  Disc risk mood cycling.  She and mother have done well with it. reduced Vyvanse  30 mg AM DT pregnancy  FU 3-4 mos  Nori Beat, MD, DFAPA   Please see After Visit Summary for patient specific instructions.  No future appointments.   No orders of the defined types were placed in this encounter.      -------------------------------

## 2023-09-06 ENCOUNTER — Other Ambulatory Visit (HOSPITAL_BASED_OUTPATIENT_CLINIC_OR_DEPARTMENT_OTHER): Payer: Self-pay

## 2023-10-16 ENCOUNTER — Other Ambulatory Visit (HOSPITAL_BASED_OUTPATIENT_CLINIC_OR_DEPARTMENT_OTHER): Payer: Self-pay

## 2023-11-22 ENCOUNTER — Other Ambulatory Visit (HOSPITAL_BASED_OUTPATIENT_CLINIC_OR_DEPARTMENT_OTHER): Payer: Self-pay

## 2023-11-22 ENCOUNTER — Other Ambulatory Visit: Payer: Self-pay | Admitting: Psychiatry

## 2023-11-22 DIAGNOSIS — F902 Attention-deficit hyperactivity disorder, combined type: Secondary | ICD-10-CM

## 2023-11-22 MED ORDER — LISDEXAMFETAMINE DIMESYLATE 30 MG PO CAPS
30.0000 mg | ORAL_CAPSULE | Freq: Every day | ORAL | 0 refills | Status: DC
  Filled 2023-12-30: qty 30, 30d supply, fill #0

## 2023-11-22 MED ORDER — LISDEXAMFETAMINE DIMESYLATE 30 MG PO CAPS: 30.0000 mg | ORAL_CAPSULE | Freq: Every day | ORAL | 0 refills | Status: DC

## 2023-11-22 MED ORDER — LISDEXAMFETAMINE DIMESYLATE 30 MG PO CAPS
30.0000 mg | ORAL_CAPSULE | Freq: Every day | ORAL | 0 refills | Status: DC
  Filled 2023-11-22: qty 30, 30d supply, fill #0

## 2023-11-23 ENCOUNTER — Other Ambulatory Visit (HOSPITAL_BASED_OUTPATIENT_CLINIC_OR_DEPARTMENT_OTHER): Payer: Self-pay

## 2023-12-25 ENCOUNTER — Encounter (HOSPITAL_COMMUNITY): Payer: Self-pay | Admitting: *Deleted

## 2023-12-25 ENCOUNTER — Encounter (HOSPITAL_COMMUNITY): Payer: Self-pay

## 2023-12-25 NOTE — Patient Instructions (Addendum)
 Felicia Young  12/25/2023   Your procedure is scheduled on:  01/08/2024  Arrive at 0800 at Entrance C on [address] at [facility]  and [address]. You are invited to use the FREE valet parking or use the Visitor's parking deck.  Pick up the phone at the desk and dial [phone].  Call this number if you have problems the morning of surgery: [phone]  Remember:   Do not eat food:(After Midnight) Desps de medianoche.  You may drink clear liquids until  __0600___.  Clear liquids means a liquid you can see thru.  It can have color such as Cola or Kool aid.  Tea is OK and coffee as long as no milk or creamer of any kind.  Take these medicines the morning of surgery with A SIP OF WATER :  Take Lamictal  and zoloft  as prescribed. May take vyvanse  as prescribed if desired   Do not wear jewelry, make-up or nail polish.  Do not wear lotions, powders, or perfumes. Do not wear deodorant.  Do not shave 48 hours prior to surgery.  Do not bring valuables to the hospital.  [facility] is not   responsible for any belongings or valuables brought to the hospital.  Contacts, dentures or bridgework may not be worn into surgery.  Leave suitcase in the car. After surgery it may be brought to your room.  For patients admitted to the hospital, checkout time is 11:00 AM the day of              discharge.      Please read over the following fact sheets that you were given:     Preparing for Surgery

## 2023-12-30 ENCOUNTER — Other Ambulatory Visit (HOSPITAL_BASED_OUTPATIENT_CLINIC_OR_DEPARTMENT_OTHER): Payer: Self-pay

## 2024-01-05 ENCOUNTER — Encounter (HOSPITAL_COMMUNITY)
Admission: RE | Admit: 2024-01-05 | Discharge: 2024-01-05 | Disposition: A | Discharge status: Home / Self Care | Source: Ambulatory Visit | Attending: Obstetrics and Gynecology | Admitting: Obstetrics and Gynecology

## 2024-01-05 DIAGNOSIS — Z01812 Encounter for preprocedural laboratory examination: Secondary | ICD-10-CM | POA: Insufficient documentation

## 2024-01-05 DIAGNOSIS — Z98891 History of uterine scar from previous surgery: Secondary | ICD-10-CM | POA: Insufficient documentation

## 2024-01-05 LAB — CBC
HCT: 32.8 % — ABNORMAL LOW (ref 36.0–46.0)
Hemoglobin: 10 g/dL — ABNORMAL LOW (ref 12.0–15.0)
MCH: 25.3 pg — ABNORMAL LOW (ref 26.0–34.0)
MCHC: 30.5 g/dL (ref 30.0–36.0)
MCV: 83 fL (ref 80.0–100.0)
Platelets: 245 K/uL (ref 150–400)
RBC: 3.95 MIL/uL (ref 3.87–5.11)
RDW: 14.6 % (ref 11.5–15.5)
WBC: 7.4 K/uL (ref 4.0–10.5)
nRBC: 0 % (ref 0.0–0.2)

## 2024-01-05 LAB — TYPE AND SCREEN
ABO/RH(D): O POS
Antibody Screen: NEGATIVE

## 2024-01-06 LAB — RPR: RPR Ser Ql: NONREACTIVE

## 2024-01-07 NOTE — Anesthesia Preprocedure Evaluation (Signed)
 Anesthesia Evaluation  Patient identified by MRN, date of birth, ID band Patient awake    Reviewed: Allergy & Precautions, NPO status , Patient's Chart, lab work & pertinent test results  Airway Mallampati: II  TM Distance: >3 FB Neck ROM: Full    Dental  (+) Dental Advisory Given, Teeth Intact   Pulmonary asthma , former smoker   Pulmonary exam normal breath sounds clear to auscultation       Cardiovascular negative cardio ROS Normal cardiovascular exam Rhythm:Regular Rate:Normal     Neuro/Psych  Headaches PSYCHIATRIC DISORDERS Anxiety Depression       GI/Hepatic negative GI ROS, Neg liver ROS,,,  Endo/Other  Obesity   Renal/GU negative Renal ROS     Musculoskeletal negative musculoskeletal ROS (+)    Abdominal  (+) + obese  Peds  (+) ADHD Hematology  (+) Blood dyscrasia, anemia Plt 223k   Anesthesia Other Findings Day of surgery medications reviewed with the patient.  Reproductive/Obstetrics (+) Pregnancy                              Anesthesia Physical Anesthesia Plan  ASA: 2  Anesthesia Plan: Spinal   Post-op Pain Management: Ofirmev  IV (intra-op)* and Toradol  IV (intra-op)*   Induction: Intravenous  PONV Risk Score and Plan: 2 and Dexamethasone , Ondansetron , Scopolamine  patch - Pre-op and Treatment may vary due to age or medical condition  Airway Management Planned: Natural Airway  Additional Equipment:   Intra-op Plan:   Post-operative Plan:   Informed Consent: I have reviewed the patients History and Physical, chart, labs and discussed the procedure including the risks, benefits and alternatives for the proposed anesthesia with the patient or authorized representative who has indicated his/her understanding and acceptance.     Dental advisory given  Plan Discussed with: CRNA  Anesthesia Plan Comments: (Risks of anesthesia explained at length. This includes, but  is not limited to, sore throat, damage to teeth, lips gums, tongue and vocal cords, nausea and vomiting, reactions to medications, stroke, heart attack, and death. All patient questions were answered and the patient wishes to proceed. Risks of spinal anesthesia explained at length. This includes, but is not limited to, bleeding, infection, reactions to the medications, seizures, headache, damage to surrounding structures, damage to nerves, permanent weakness, numbness, tingling and pain. All patient questions were answered and patient wishes to proceed with spinal anesthesia.  )        Anesthesia Quick Evaluation

## 2024-01-08 ENCOUNTER — Inpatient Hospital Stay (HOSPITAL_COMMUNITY): Payer: Self-pay | Admitting: Anesthesiology

## 2024-01-08 ENCOUNTER — Encounter (HOSPITAL_COMMUNITY): Payer: Self-pay | Admitting: Obstetrics and Gynecology

## 2024-01-08 ENCOUNTER — Inpatient Hospital Stay (HOSPITAL_COMMUNITY)
Admission: RE | Admit: 2024-01-08 | Discharge: 2024-01-11 | DRG: 784 | Disposition: A | Discharge status: Home / Self Care | Attending: Obstetrics and Gynecology | Admitting: Obstetrics and Gynecology

## 2024-01-08 ENCOUNTER — Other Ambulatory Visit: Payer: Self-pay

## 2024-01-08 ENCOUNTER — Encounter (HOSPITAL_COMMUNITY)
Admission: RE | Disposition: A | Discharge status: Home / Self Care | Payer: Self-pay | Source: Home / Self Care | Attending: Obstetrics and Gynecology

## 2024-01-08 DIAGNOSIS — F32A Depression, unspecified: Secondary | ICD-10-CM | POA: Diagnosis present

## 2024-01-08 DIAGNOSIS — O99214 Obesity complicating childbirth: Secondary | ICD-10-CM | POA: Diagnosis present

## 2024-01-08 DIAGNOSIS — O34211 Maternal care for low transverse scar from previous cesarean delivery: Secondary | ICD-10-CM | POA: Diagnosis present

## 2024-01-08 DIAGNOSIS — O1415 Severe pre-eclampsia, complicating the puerperium: Secondary | ICD-10-CM | POA: Diagnosis not present

## 2024-01-08 DIAGNOSIS — F909 Attention-deficit hyperactivity disorder, unspecified type: Secondary | ICD-10-CM | POA: Diagnosis present

## 2024-01-08 DIAGNOSIS — Z8249 Family history of ischemic heart disease and other diseases of the circulatory system: Secondary | ICD-10-CM | POA: Diagnosis not present

## 2024-01-08 DIAGNOSIS — O99344 Other mental disorders complicating childbirth: Secondary | ICD-10-CM | POA: Diagnosis present

## 2024-01-08 DIAGNOSIS — F419 Anxiety disorder, unspecified: Secondary | ICD-10-CM | POA: Diagnosis present

## 2024-01-08 DIAGNOSIS — D62 Acute posthemorrhagic anemia: Secondary | ICD-10-CM | POA: Diagnosis not present

## 2024-01-08 DIAGNOSIS — Z87891 Personal history of nicotine dependence: Secondary | ICD-10-CM | POA: Diagnosis not present

## 2024-01-08 DIAGNOSIS — Z98891 History of uterine scar from previous surgery: Principal | ICD-10-CM

## 2024-01-08 DIAGNOSIS — O9081 Anemia of the puerperium: Secondary | ICD-10-CM | POA: Diagnosis not present

## 2024-01-08 DIAGNOSIS — Z302 Encounter for sterilization: Secondary | ICD-10-CM | POA: Diagnosis not present

## 2024-01-08 DIAGNOSIS — Z3A39 39 weeks gestation of pregnancy: Secondary | ICD-10-CM

## 2024-01-08 LAB — COMPREHENSIVE METABOLIC PANEL WITH GFR
ALT: 9 U/L (ref 0–44)
ALT: 9 U/L (ref 0–44)
AST: 16 U/L (ref 15–41)
AST: 21 U/L (ref 15–41)
Albumin: 2.4 g/dL — ABNORMAL LOW (ref 3.5–5.0)
Albumin: 2.1 g/dL — ABNORMAL LOW (ref 3.5–5.0)
Alkaline Phosphatase: 103 U/L (ref 38–126)
Alkaline Phosphatase: 89 U/L (ref 38–126)
Anion gap: 8 (ref 5–15)
Anion gap: 9 (ref 5–15)
BUN: <5 mg/dL — ABNORMAL LOW (ref 6–20)
BUN: 8 mg/dL (ref 6–20)
CO2: 20 mmol/L — ABNORMAL LOW (ref 22–32)
CO2: 20 mmol/L — ABNORMAL LOW (ref 22–32)
Calcium: 8.4 mg/dL — ABNORMAL LOW (ref 8.9–10.3)
Calcium: 8.8 mg/dL — ABNORMAL LOW (ref 8.9–10.3)
Chloride: 106 mmol/L (ref 98–111)
Chloride: 104 mmol/L (ref 98–111)
Creatinine, Ser: 0.48 mg/dL (ref 0.44–1.00)
Creatinine, Ser: 0.64 mg/dL (ref 0.44–1.00)
GFR, Estimated: >60 mL/min (ref >60)
GFR, Estimated: >60 mL/min (ref >60)
Glucose, Bld: 95 mg/dL (ref 70–99)
Glucose, Bld: 143 mg/dL — ABNORMAL HIGH (ref 70–99)
Potassium: 3.8 mmol/L (ref 3.5–5.1)
Potassium: 4 mmol/L (ref 3.5–5.1)
Sodium: 134 mmol/L — ABNORMAL LOW (ref 135–145)
Sodium: 133 mmol/L — ABNORMAL LOW (ref 135–145)
Total Bilirubin: 1.1 mg/dL (ref 0.0–1.2)
Total Bilirubin: 0.8 mg/dL (ref 0.0–1.2)
Total Protein: 5.6 g/dL — ABNORMAL LOW (ref 6.5–8.1)
Total Protein: 4.8 g/dL — ABNORMAL LOW (ref 6.5–8.1)

## 2024-01-08 LAB — CBC WITH DIFFERENTIAL/PLATELET
Abs Immature Granulocytes: 0.06 K/uL (ref 0.00–0.07)
Basophils Absolute: 0 K/uL (ref 0.0–0.1)
Basophils Relative: 0 %
Eosinophils Absolute: 0 K/uL (ref 0.0–0.5)
Eosinophils Relative: 0 %
HCT: 26.2 % — ABNORMAL LOW (ref 36.0–46.0)
Hemoglobin: 8.3 g/dL — ABNORMAL LOW (ref 12.0–15.0)
Immature Granulocytes: 1 %
Lymphocytes Relative: 8 %
Lymphs Abs: 1 K/uL (ref 0.7–4.0)
MCH: 25.9 pg — ABNORMAL LOW (ref 26.0–34.0)
MCHC: 31.7 g/dL (ref 30.0–36.0)
MCV: 81.6 fL (ref 80.0–100.0)
Monocytes Absolute: 0.5 K/uL (ref 0.1–1.0)
Monocytes Relative: 5 %
Neutro Abs: 10.2 K/uL — ABNORMAL HIGH (ref 1.7–7.7)
Neutrophils Relative %: 86 %
Platelets: 236 K/uL (ref 150–400)
RBC: 3.21 MIL/uL — ABNORMAL LOW (ref 3.87–5.11)
RDW: 14.7 % (ref 11.5–15.5)
WBC: 11.8 K/uL — ABNORMAL HIGH (ref 4.0–10.5)
nRBC: 0 % (ref 0.0–0.2)

## 2024-01-08 LAB — PROTEIN / CREATININE RATIO, URINE
Creatinine, Urine: 19 mg/dL
Protein Creatinine Ratio: 0.37 mg/mg{creat} — ABNORMAL HIGH (ref 0.00–0.15)
Total Protein, Urine: 7 mg/dL

## 2024-01-08 SURGERY — Surgical Case
Anesthesia: Spinal | Site: Abdomen

## 2024-01-08 MED ORDER — PHENYLEPHRINE HCL-NACL 20-0.9 MG/250ML-% IV SOLN
INTRAVENOUS | Status: DC | PRN
  Administered 2024-01-08: 60 ug/min via INTRAVENOUS

## 2024-01-08 MED ORDER — POVIDONE-IODINE 10 % EX SWAB
2.0000 | Freq: Once | CUTANEOUS | Status: AC
  Administered 2024-01-08: 2 via TOPICAL

## 2024-01-08 MED ORDER — OXYTOCIN-SODIUM CHLORIDE 30-0.9 UT/500ML-% IV SOLN
2.5000 [IU]/h | INTRAVENOUS | Status: AC
Start: 2024-01-08 — End: 2024-01-09
  Administered 2024-01-08: 2.5 [IU]/h via INTRAVENOUS
  Filled 2024-01-08: qty 500

## 2024-01-08 MED ORDER — OXYCODONE HCL 5 MG PO TABS: 5.0000 mg | ORAL_TABLET | ORAL | Status: DC | PRN

## 2024-01-08 MED ORDER — SERTRALINE HCL 50 MG PO TABS
150.0000 mg | ORAL_TABLET | Freq: Every day | ORAL | Status: DC
  Administered 2024-01-08 – 2024-01-11 (×4): 150 mg via ORAL
  Filled 2024-01-08: qty 1
  Filled 2024-01-08: qty 3
  Filled 2024-01-08: qty 1
  Filled 2024-01-08 (×2): qty 3

## 2024-01-08 MED ORDER — HYDRALAZINE HCL 20 MG/ML IJ SOLN: 10.0000 mg | INTRAMUSCULAR | Status: DC | PRN

## 2024-01-08 MED ORDER — WITCH HAZEL-GLYCERIN EX PADS: 1.0000 | MEDICATED_PAD | CUTANEOUS | Status: DC | PRN

## 2024-01-08 MED ORDER — LAMOTRIGINE 150 MG PO TABS
150.0000 mg | ORAL_TABLET | Freq: Every day | ORAL | Status: DC
  Administered 2024-01-08 – 2024-01-10 (×3): 150 mg via ORAL
  Filled 2024-01-08 (×5): qty 1

## 2024-01-08 MED ORDER — MAGNESIUM SULFATE 40 GM/1000ML IV SOLN
2.0000 g/h | INTRAVENOUS | Status: AC
  Administered 2024-01-08 – 2024-01-09 (×2): 2 g/h via INTRAVENOUS
  Filled 2024-01-08 (×2): qty 1000

## 2024-01-08 MED ORDER — DIPHENHYDRAMINE HCL 25 MG PO CAPS: 25.0000 mg | ORAL_CAPSULE | ORAL | Status: DC | PRN

## 2024-01-08 MED ORDER — DIBUCAINE (PERIANAL) 1 % EX OINT: 1.0000 | TOPICAL_OINTMENT | CUTANEOUS | Status: DC | PRN

## 2024-01-08 MED ORDER — MORPHINE SULFATE (PF) 0.5 MG/ML IJ SOLN
INTRAMUSCULAR | Status: AC
  Filled 2024-01-08: qty 10

## 2024-01-08 MED ORDER — SIMETHICONE 80 MG PO CHEW: 80.0000 mg | CHEWABLE_TABLET | ORAL | Status: DC | PRN

## 2024-01-08 MED ORDER — ONDANSETRON HCL 4 MG/2ML IJ SOLN: 4.0000 mg | Freq: Three times a day (TID) | INTRAMUSCULAR | Status: DC | PRN

## 2024-01-08 MED ORDER — COCONUT OIL OIL: 1.0000 | TOPICAL_OIL | Status: DC | PRN

## 2024-01-08 MED ORDER — ACETAMINOPHEN 10 MG/ML IV SOLN
INTRAVENOUS | Status: DC | PRN
  Administered 2024-01-08: 1000 mg via INTRAVENOUS

## 2024-01-08 MED ORDER — DEXAMETHASONE SODIUM PHOSPHATE 10 MG/ML IJ SOLN
INTRAMUSCULAR | Status: AC
  Filled 2024-01-08: qty 1

## 2024-01-08 MED ORDER — CEFAZOLIN SODIUM-DEXTROSE 2-4 GM/100ML-% IV SOLN
INTRAVENOUS | Status: AC
  Filled 2024-01-08: qty 100

## 2024-01-08 MED ORDER — KETOROLAC TROMETHAMINE 30 MG/ML IJ SOLN
30.0000 mg | Freq: Four times a day (QID) | INTRAMUSCULAR | Status: AC
  Administered 2024-01-08 – 2024-01-09 (×4): 30 mg via INTRAVENOUS
  Filled 2024-01-08 (×4): qty 1

## 2024-01-08 MED ORDER — CEFAZOLIN SODIUM-DEXTROSE 2-4 GM/100ML-% IV SOLN
2.0000 g | INTRAVENOUS | Status: AC
  Administered 2024-01-08: 2 g via INTRAVENOUS

## 2024-01-08 MED ORDER — LABETALOL HCL 5 MG/ML IV SOLN: 20.0000 mg | INTRAVENOUS | Status: DC | PRN

## 2024-01-08 MED ORDER — MENTHOL 3 MG MT LOZG: 1.0000 | LOZENGE | OROMUCOSAL | Status: DC | PRN

## 2024-01-08 MED ORDER — NALOXONE HCL 0.4 MG/ML IJ SOLN: 0.4000 mg | INTRAMUSCULAR | Status: DC | PRN

## 2024-01-08 MED ORDER — DEXMEDETOMIDINE HCL IN NACL 80 MCG/20ML IV SOLN
INTRAVENOUS | Status: AC
  Filled 2024-01-08: qty 20

## 2024-01-08 MED ORDER — KETOROLAC TROMETHAMINE 30 MG/ML IJ SOLN
INTRAMUSCULAR | Status: AC
  Filled 2024-01-08: qty 1

## 2024-01-08 MED ORDER — LABETALOL HCL 5 MG/ML IV SOLN: 40.0000 mg | INTRAVENOUS | Status: DC | PRN

## 2024-01-08 MED ORDER — ONDANSETRON HCL 4 MG/2ML IJ SOLN
INTRAMUSCULAR | Status: AC
  Filled 2024-01-08: qty 2

## 2024-01-08 MED ORDER — SCOPOLAMINE 1 MG/3DAYS TD PT72
1.0000 | MEDICATED_PATCH | TRANSDERMAL | Status: DC
  Administered 2024-01-08: 1 mg via TRANSDERMAL

## 2024-01-08 MED ORDER — SOD CITRATE-CITRIC ACID 500-334 MG/5ML PO SOLN
ORAL | Status: AC
  Filled 2024-01-08: qty 30

## 2024-01-08 MED ORDER — PRENATAL MULTIVITAMIN CH
1.0000 | ORAL_TABLET | Freq: Every day | ORAL | Status: DC
  Administered 2024-01-09 – 2024-01-10 (×2): 1 via ORAL
  Filled 2024-01-08 (×3): qty 1

## 2024-01-08 MED ORDER — MAGNESIUM SULFATE BOLUS VIA INFUSION
4.0000 g | Freq: Once | INTRAVENOUS | Status: AC
  Administered 2024-01-08: 4 g via INTRAVENOUS
  Filled 2024-01-08: qty 1000

## 2024-01-08 MED ORDER — DIPHENHYDRAMINE HCL 50 MG/ML IJ SOLN: 12.5000 mg | INTRAMUSCULAR | Status: DC | PRN

## 2024-01-08 MED ORDER — ONDANSETRON HCL 4 MG/2ML IJ SOLN
INTRAMUSCULAR | Status: DC | PRN
  Administered 2024-01-08: 4 mg via INTRAVENOUS

## 2024-01-08 MED ORDER — HYDRALAZINE HCL 20 MG/ML IJ SOLN: 5.0000 mg | INTRAMUSCULAR | Status: DC | PRN

## 2024-01-08 MED ORDER — TRANEXAMIC ACID-NACL 1000-0.7 MG/100ML-% IV SOLN
INTRAVENOUS | Status: AC
  Filled 2024-01-08: qty 100

## 2024-01-08 MED ORDER — LACTATED RINGERS IV SOLN: INTRAVENOUS | Status: AC

## 2024-01-08 MED ORDER — DROPERIDOL 2.5 MG/ML IJ SOLN: 0.6250 mg | Freq: Once | INTRAMUSCULAR | Status: DC | PRN

## 2024-01-08 MED ORDER — ACETAMINOPHEN 500 MG PO TABS: 1000.0000 mg | ORAL_TABLET | Freq: Once | ORAL | Status: DC

## 2024-01-08 MED ORDER — NALOXONE HCL 4 MG/10ML IJ SOLN: 1.0000 ug/kg/h | INTRAVENOUS | Status: DC | PRN

## 2024-01-08 MED ORDER — SODIUM CHLORIDE 0.9% FLUSH: 3.0000 mL | INTRAVENOUS | Status: DC | PRN

## 2024-01-08 MED ORDER — SIMETHICONE 80 MG PO CHEW
80.0000 mg | CHEWABLE_TABLET | Freq: Three times a day (TID) | ORAL | Status: DC
  Administered 2024-01-08 – 2024-01-11 (×10): 80 mg via ORAL
  Filled 2024-01-08 (×10): qty 1

## 2024-01-08 MED ORDER — HYDROMORPHONE HCL 1 MG/ML IJ SOLN: 0.2500 mg | INTRAMUSCULAR | Status: DC | PRN

## 2024-01-08 MED ORDER — ZOLPIDEM TARTRATE 5 MG PO TABS: 5.0000 mg | ORAL_TABLET | Freq: Every evening | ORAL | Status: DC | PRN

## 2024-01-08 MED ORDER — OXYTOCIN-SODIUM CHLORIDE 30-0.9 UT/500ML-% IV SOLN
INTRAVENOUS | Status: AC
  Filled 2024-01-08: qty 500

## 2024-01-08 MED ORDER — ACETAMINOPHEN 500 MG PO TABS
1000.0000 mg | ORAL_TABLET | Freq: Four times a day (QID) | ORAL | Status: DC
  Administered 2024-01-08 – 2024-01-11 (×11): 1000 mg via ORAL
  Filled 2024-01-08 (×11): qty 2

## 2024-01-08 MED ORDER — LACTATED RINGERS IV SOLN: INTRAVENOUS | Status: DC

## 2024-01-08 MED ORDER — FENTANYL CITRATE (PF) 100 MCG/2ML IJ SOLN
INTRAMUSCULAR | Status: DC | PRN
  Administered 2024-01-08: 15 ug via INTRATHECAL

## 2024-01-08 MED ORDER — SODIUM CHLORIDE 0.9 % IV SOLN: INTRAVENOUS | Status: AC

## 2024-01-08 MED ORDER — SODIUM CHLORIDE 0.9 % IR SOLN
Status: DC | PRN
  Administered 2024-01-08: 1000 mL

## 2024-01-08 MED ORDER — DIPHENHYDRAMINE HCL 25 MG PO CAPS: 25.0000 mg | ORAL_CAPSULE | Freq: Four times a day (QID) | ORAL | Status: DC | PRN

## 2024-01-08 MED ORDER — PHENYLEPHRINE HCL (PRESSORS) 10 MG/ML IV SOLN
INTRAVENOUS | Status: DC | PRN
  Administered 2024-01-08: 40 ug via INTRAVENOUS

## 2024-01-08 MED ORDER — TRANEXAMIC ACID-NACL 1000-0.7 MG/100ML-% IV SOLN
1000.0000 mg | Freq: Once | INTRAVENOUS | Status: AC
  Administered 2024-01-08: 1000 mg via INTRAVENOUS

## 2024-01-08 MED ORDER — SENNOSIDES-DOCUSATE SODIUM 8.6-50 MG PO TABS
2.0000 | ORAL_TABLET | Freq: Every day | ORAL | Status: DC
  Administered 2024-01-09 – 2024-01-11 (×3): 2 via ORAL
  Filled 2024-01-08 (×3): qty 2

## 2024-01-08 MED ORDER — DEXAMETHASONE SODIUM PHOSPHATE 10 MG/ML IJ SOLN
INTRAMUSCULAR | Status: DC | PRN
  Administered 2024-01-08: 10 mg via INTRAVENOUS

## 2024-01-08 MED ORDER — STERILE WATER FOR IRRIGATION IR SOLN
Status: DC | PRN
  Administered 2024-01-08: 1000 mL

## 2024-01-08 MED ORDER — SCOPOLAMINE 1 MG/3DAYS TD PT72
MEDICATED_PATCH | TRANSDERMAL | Status: AC
  Filled 2024-01-08: qty 1

## 2024-01-08 MED ORDER — KETOROLAC TROMETHAMINE 30 MG/ML IJ SOLN
INTRAMUSCULAR | Status: DC | PRN
  Administered 2024-01-08: 30 mg via INTRAVENOUS

## 2024-01-08 MED ORDER — BUPIVACAINE IN DEXTROSE 0.75-8.25 % IT SOLN
INTRATHECAL | Status: DC | PRN
  Administered 2024-01-08: 1.6 mL via INTRATHECAL

## 2024-01-08 MED ORDER — MEPERIDINE HCL 25 MG/ML IJ SOLN: 6.2500 mg | INTRAMUSCULAR | Status: DC | PRN

## 2024-01-08 MED ORDER — SOD CITRATE-CITRIC ACID 500-334 MG/5ML PO SOLN
30.0000 mL | Freq: Once | ORAL | Status: AC
  Administered 2024-01-08: 30 mL via ORAL

## 2024-01-08 MED ORDER — FENTANYL CITRATE (PF) 100 MCG/2ML IJ SOLN
INTRAMUSCULAR | Status: AC
  Filled 2024-01-08: qty 2

## 2024-01-08 MED ORDER — LABETALOL HCL 100 MG PO TABS
100.0000 mg | ORAL_TABLET | Freq: Two times a day (BID) | ORAL | Status: DC
  Administered 2024-01-08 (×2): 100 mg via ORAL
  Filled 2024-01-08 (×3): qty 1

## 2024-01-08 MED ORDER — FENTANYL CITRATE (PF) 100 MCG/2ML IJ SOLN: INTRAMUSCULAR | Status: DC | PRN

## 2024-01-08 MED ORDER — PHENYLEPHRINE 80 MCG/ML (10ML) SYRINGE FOR IV PUSH (FOR BLOOD PRESSURE SUPPORT)
PREFILLED_SYRINGE | INTRAVENOUS | Status: AC
  Filled 2024-01-08: qty 10

## 2024-01-08 MED ORDER — PHENYLEPHRINE HCL-NACL 20-0.9 MG/250ML-% IV SOLN
INTRAVENOUS | Status: AC
  Filled 2024-01-08: qty 250

## 2024-01-08 MED ORDER — NIFEDIPINE ER OSMOTIC RELEASE 30 MG PO TB24
30.0000 mg | ORAL_TABLET | Freq: Every day | ORAL | Status: DC
  Administered 2024-01-09 – 2024-01-11 (×3): 30 mg via ORAL
  Filled 2024-01-08 (×3): qty 1

## 2024-01-08 MED ORDER — SCOPOLAMINE 1 MG/3DAYS TD PT72
1.0000 | MEDICATED_PATCH | Freq: Once | TRANSDERMAL | Status: DC
  Filled 2024-01-08: qty 1

## 2024-01-08 MED ORDER — DEXMEDETOMIDINE HCL IN NACL 80 MCG/20ML IV SOLN
INTRAVENOUS | Status: DC | PRN
  Administered 2024-01-08: 8 ug via INTRAVENOUS

## 2024-01-08 MED ORDER — IBUPROFEN 600 MG PO TABS
600.0000 mg | ORAL_TABLET | Freq: Four times a day (QID) | ORAL | Status: DC
  Administered 2024-01-09 – 2024-01-11 (×7): 600 mg via ORAL
  Filled 2024-01-08 (×7): qty 1

## 2024-01-08 MED ORDER — LISDEXAMFETAMINE DIMESYLATE 30 MG PO CAPS
30.0000 mg | ORAL_CAPSULE | Freq: Every day | ORAL | Status: DC
  Administered 2024-01-09 – 2024-01-11 (×3): 30 mg via ORAL
  Filled 2024-01-08 (×3): qty 1

## 2024-01-08 MED ORDER — ACETAMINOPHEN 10 MG/ML IV SOLN
INTRAVENOUS | Status: AC
  Filled 2024-01-08: qty 100

## 2024-01-08 MED ORDER — MORPHINE SULFATE (PF) 0.5 MG/ML IJ SOLN
INTRAMUSCULAR | Status: DC | PRN
  Administered 2024-01-08: 150 ug via INTRATHECAL

## 2024-01-08 SURGICAL SUPPLY — 36 items
BENZOIN TINCTURE PRP APPL 2/3 (GAUZE/BANDAGES/DRESSINGS) IMPLANT
CHLORAPREP W/TINT 26ML (MISCELLANEOUS) ×2 IMPLANT
CLAMP UMBILICAL CORD (MISCELLANEOUS) ×1 IMPLANT
CLOTH BEACON ORANGE TIMEOUT ST (SAFETY) ×1 IMPLANT
DERMABOND ADVANCED .7 DNX12 (GAUZE/BANDAGES/DRESSINGS) ×1 IMPLANT
DISSECTOR SURG LIGASURE 21 (MISCELLANEOUS) IMPLANT
DRSG OPSITE POSTOP 4X10 (GAUZE/BANDAGES/DRESSINGS) ×1 IMPLANT
ELECTRODE REM PT RTRN 9FT ADLT (ELECTROSURGICAL) ×1 IMPLANT
EXTRACTOR VACUUM BELL STYLE (SUCTIONS) IMPLANT
GAUZE PAD ABD 7.5X8 STRL (GAUZE/BANDAGES/DRESSINGS) IMPLANT
GAUZE SPONGE 4X4 12PLY STRL LF (GAUZE/BANDAGES/DRESSINGS) IMPLANT
GLOVE BIO SURGEON STRL SZ 6.5 (GLOVE) ×1 IMPLANT
GLOVE BIOGEL PI IND STRL 6.5 (GLOVE) ×1 IMPLANT
GLOVE BIOGEL PI IND STRL 7.0 (GLOVE) ×2 IMPLANT
GOWN STRL REUS W/TWL LRG LVL3 (GOWN DISPOSABLE) ×2 IMPLANT
KIT ABG SYR 3ML LUER SLIP (SYRINGE) IMPLANT
NDL HYPO 25X5/8 SAFETYGLIDE (NEEDLE) IMPLANT
NEEDLE HYPO 25X5/8 SAFETYGLIDE (NEEDLE) IMPLANT
NS IRRIG 1000ML POUR BTL (IV SOLUTION) ×1 IMPLANT
PACK C SECTION WH (CUSTOM PROCEDURE TRAY) ×1 IMPLANT
PAD ABD 8X10 STRL (GAUZE/BANDAGES/DRESSINGS) IMPLANT
PAD OB MATERNITY 4.3X12.25 (PERSONAL CARE ITEMS) ×1 IMPLANT
PENCIL SMOKE EVAC W/HOLSTER (ELECTROSURGICAL) ×1 IMPLANT
RTRCTR C-SECT PINK 25CM LRG (MISCELLANEOUS) IMPLANT
STRIP CLOSURE SKIN 1/2X4 (GAUZE/BANDAGES/DRESSINGS) IMPLANT
SUT PDS AB 0 CTX 36 PDP370T (SUTURE) IMPLANT
SUT PLAIN ABS 2-0 CT1 27XMFL (SUTURE) IMPLANT
SUT VIC AB 0 CT1 36 (SUTURE) ×2 IMPLANT
SUT VIC AB 2-0 CT1 TAPERPNT 27 (SUTURE) ×1 IMPLANT
SUT VIC AB 3-0 SH 27X BRD (SUTURE) IMPLANT
SUT VIC AB 4-0 KS 27 (SUTURE) ×1 IMPLANT
SUTURE PLAIN GUT 2.0 ETHICON (SUTURE) IMPLANT
TAPE CLOTH SURG 4X10 WHT LF (GAUZE/BANDAGES/DRESSINGS) IMPLANT
TOWEL OR 17X24 6PK STRL BLUE (TOWEL DISPOSABLE) ×1 IMPLANT
TRAY FOLEY W/BAG SLVR 14FR LF (SET/KITS/TRAYS/PACK) ×1 IMPLANT
WATER STERILE IRR 1000ML POUR (IV SOLUTION) ×1 IMPLANT

## 2024-01-08 NOTE — Op Note (Addendum)
 C-Section Operative Note  Date: 01/08/24  Preoperative Diagnosis: IUP @ 39 3/7 Postoperative Diagnosis: same as above Procedure: ERLTCS with bilateral salpingectomy without complications Indication: history of section x3, satisfied fertility Surgeon: Lavonia Guppy, MD Assist: Ted Solo, DO Findings: Viable female infant weighing 9lb3oz with APGARS of 8 and 9 at 1 and 5 minutes, respectively. Normal appearing uterus, bilateral fallopian tubes and ovaries. Scarred subcutaneous tissue to fascia and scarring to rectus bellies. No intra-abdominal adhesions noted. Specimens: bilateral fallopian tube to pathology. Placenta to L&D EBL 462cc IVF 1400 UOP 100  Patient Course: Admitted for ERLTCS+BS for history of section x3. Of note, very anxious with elevated BP prior to procedure but ASX from HTN standpoint  Consent:  R/B/A of cesarean section discussed with patient. Alternative would be vaginal delivery which would mean shorter postpartum stay and decreased risk of bleeding. Risks of section include infection of the uterus, pelvic organs, or skin, inadvertent injury to internal organs, such as bowel or bladder. If there is major injury, extensive surgery may be required. If injury is minor, it may be treated with relative ease. Discussed possibility of excessive blood loss and transfusion. If bleeding cannot be controlled using medical or minor surgical methods, a cesarean hysterectomy may be performed which would mean no future fertility. Patient accepts the possibility of blood transfusion, if necessary. Patient understands and agrees to move forward with section.   Operative Procedure: Patient was taken to the operating room where epidural anesthesia was found to be adequate by Allis clamp test. She was prepped and draped in the normal sterile fashion in the dorsal supine position with a leftward tilt. An appropriate time out was performed. A Pfannenstiel skin incision was then made with the  scalpel and carried through to the underlying layer of fascia by sharp dissection and Bovie cautery. The fascia was nicked in the midline and the incision was extended laterally with Mayo scissors. The superior aspect of the incision was grasped Kocher clamps and dissected off the underlying rectus muscles. In a similar fashion the inferior aspect was dissected off the rectus muscles - dense scarring noted taking down with Mayo and Bovie. Rectus muscles were separated in the midline and the peritoneal cavity entered bluntly. Peritoneum adherent to rectus as expected. The peritoneal incision was then extended both superiorly and inferiorly with careful attention to avoid both bowel and bladder. The Alexis self-retaining wound retractor were then placed within the incision and the lower uterine segment exposed. The bladder flap was developed with Metzenbaum scissors and pushed away from the lower uterine segment. The lower uterine segment was then incised in a low transverse fashion and the cavity itself entered bluntly. The incision was extended bluntly. Amniotic sac was ruptured and fluid was noted to be clear in color. The infant's head was then lifted and delivered from the incision without difficulty using the standard movements. The remainder of the infant delivered and the nose and mouth bulb suctioned with the cord clamped and cut as well. The infant was handed off to NICU. The placenta was then spontaneously expressed from the uterus and the uterus cleared of all clots and debris with moist lap sponge. The uterine incision was then repaired in 2 layers the first layer was a running locked layer of 0-vicryl and the second an imbricating layer of the same suture. The tubes and ovaries were inspected and the gutters cleared of all clots and debris. The uterine incision was inspected and found to be hemostatic. Attention turned to  left adnexa with left fallopian tube was identified and elevated using Babcock  clamps times 2. Using mini-Ligasure, mesosalpinx transected along length from fimbriae to cornu. Process repeated on right side, both tubes passed off to pathology. All instruments and sponges as well as the Alexis retractor were then removed from the abdomen. The fascia was then closed with 0 Vicryl in a running fashion. Subcutaneous tissue was reapproximated with 3-0 plain in a running fashion. The skin was closed with a subcuticular stitch of 4-0 Vicryl on a Keith needle and then reinforced with Dermabond and a Honeycomb. At the conclusion of the procedure all instruments and sponge counts were correct. Patient was taken to the recovery room in good condition with her baby accompanying her skin to skin.   Of note, normotensive following delivery.

## 2024-01-08 NOTE — Transfer of Care (Signed)
 Immediate Anesthesia Transfer of Care Note  Patient: Felicia Young  Procedure(s) Performed: CESAREAN SECTION, WITH BILATERAL TUBAL LIGATION (Abdomen)  Patient Location: PACU  Anesthesia Type:Spinal  Level of Consciousness: awake, alert , and oriented  Airway & Oxygen Therapy: Patient Spontanous Breathing  Post-op Assessment: Report given to RN and Post -op Vital signs reviewed and stable  Post vital signs: Reviewed and stable  Last Vitals:  Vitals Value Taken Time  BP 153/101 01/08/24 11:42  Temp    Pulse 73 01/08/24 11:45  Resp 20   SpO2 99 % 01/08/24 11:45  Vitals shown include unfiled device data.  Last Pain:  Vitals:   01/08/24 0833  TempSrc: Oral         Complications: No notable events documented.

## 2024-01-08 NOTE — Progress Notes (Signed)
 CMP and uPC ordered for elevated but non-severe range BP while in PACU/. No prior HTN issues. Patient thinks this may eb 2/2 anxiety, however will start labetalol 100mg  BID at this time.  BP (!) 147/95   Pulse 78   Temp (!) 97.5 F (36.4 C) (Oral)   Resp 18   SpO2 97%   Breastfeeding Unknown

## 2024-01-08 NOTE — Progress Notes (Signed)
 Notified by RN of persistent severe range BP x2 during orthostatic VS (systolics 160-170s) - asymptomatic, UOP has been appropriate. Evening labetalol just given, HR in 80s. Postop pain overall well-controlled. Given new SRBP and known uPC >0.3, PreE with SF. Will order magnesium sulfate for seizure ppx. Switch labetalol to Nif 30XL to begin tomorrow. All questions answered

## 2024-01-08 NOTE — Progress Notes (Signed)
 BP 139/87   Pulse 69   Temp 98.2 F (36.8 C) (Oral)   Resp 18   Ht 5' 10 (1.778 m)   Wt 100.7 kg   SpO2 100%   Breastfeeding Unknown   BMI 31.85 kg/m  Patient is currently normotensive. Has been asymptomatic from preE standpoint. CMP was WNL however uPC ordered from postop Foley elevated at 0.37. Given elevvated BP previously with uPC, qualifies as PreE without SF. No magnesium sulfate at this time. Continue labetalol 100mg  BID at this time, will trend BP overnight to titrate and look out for PreE symptoms/UOP. RN aware of plan

## 2024-01-08 NOTE — Anesthesia Procedure Notes (Signed)
 Spinal  Patient location during procedure: OR Start time: 01/08/2024 10:17 AM End time: 01/08/2024 10:23 AM Reason for block: surgical anesthesia Staffing Performed: anesthesiologist  Anesthesiologist: Darlyn Rush, MD Performed by: Darlyn Rush, MD Authorized by: Darlyn Rush, MD   Preanesthetic Checklist Completed: patient identified, IV checked, site marked, risks and benefits discussed, surgical consent, monitors and equipment checked, pre-op evaluation and timeout performed Spinal Block Patient position: sitting Prep: DuraPrep and site prepped and draped Patient monitoring: heart rate, continuous pulse ox and blood pressure Approach: midline Location: L2-3 Injection technique: single-shot Needle Needle type: Spinocan  Needle gauge: 25 G Needle length: 9 cm Assessment Sensory level: T4 Additional Notes Expiration date of kit checked and confirmed. Patient tolerated procedure well, without complications.

## 2024-01-08 NOTE — H&P (Signed)
 Felicia Young is a 33 y.o. female presenting for scheduled IOL. +FM, denies VB, LOF, cramping  PNC c/b 1) H/o csx x3 - repeat with BS planned 2) Depression/ADHD - Lamictal /sertraline /Vyvanse  3) maternal obesity - BMI 32 4) Short interval pregnancy - delivered 05/2022  GBS neg OB History     Gravida  4   Para  3   Term  3   Preterm      AB      Living  3      SAB      IAB      Ectopic      Multiple  0   Live Births  3          Past Medical History:  Diagnosis Date   ADHD    ALLERGIC RHINITIS    seasonal   Depression    Dyslipidemia 01/11/2013   CPX 01/2013 dx - Recommend diet/aerobic exercise changes with weight reduction   Exercise-induced asthma    Generalized anxiety disorder    Migraine    PCOS (polycystic ovarian syndrome)    Past Surgical History:  Procedure Laterality Date   CESAREAN SECTION N/A 10/23/2013   Procedure: Primary Cesarean Section Delivery Baby Girl @ 22 08, Apgars 7/9;  Surgeon: Nathanel LELON Bunker, MD;  Location: [facility] ORS;  Service: Obstetrics;  Laterality: N/A;   CESAREAN SECTION N/A 11/18/2016   Procedure: REPEAT CESAREAN SECTION;  Surgeon: Bunker Nathanel, MD;  Location: [facility] BIRTHING SUIT[facility];  Service: Obstetrics;  Laterality: N/A;  MD requests RNFA   CESAREAN SECTION Bilateral 05/17/2022   Procedure: CESAREAN SECTION;  Surgeon: Bunker Nathanel, MD;  Location: [facility] LD ORS;  Service: Obstetrics;  Laterality: Bilateral;  request OB Fellow   ORIF RADIAL FRACTURE Left 1999   WISDOM TOOTH EXTRACTION  2011   Family History: family history includes Alcohol abuse in her maternal grandmother; Arthritis in her father and mother; Hyperlipidemia in her maternal grandfather, maternal grandmother, paternal grandfather, and paternal grandmother; Hypertension in her mother and paternal grandfather; Lymphoma in her father; Stroke in her maternal grandmother. Social History:  reports that she has quit smoking. Her smoking use included cigarettes. She has  never used smokeless tobacco. She reports that she does not drink alcohol and does not use drugs.     Maternal Diabetes: No1hr 108 Genetic Screening: Normal Maternal Ultrasounds/Referrals: Normal Fetal Ultrasounds or other Referrals:  None Maternal Substance Abuse:  No Significant Maternal Medications:  Meds include: Other: as above Significant Maternal Lab Results:  Group B Strep negative Number of Prenatal Visits:greater than 3 verified prenatal visits Maternal Vaccinations:TDap Other Comments:  None  Review of Systems  Constitutional:  Negative for chills and fever.  Respiratory:  Negative for shortness of breath.   Cardiovascular:  Negative for chest pain, palpitations and leg swelling.  Gastrointestinal:  Negative for abdominal pain and vomiting.  Neurological:  Negative for dizziness, weakness and headaches.  Psychiatric/Behavioral:  Negative for suicidal ideas.    Maternal Medical History:  Contractions: Frequency: irregular.   Perceived severity is mild.   Fetal activity: Perceived fetal activity is normal.   Prenatal complications: No PIH.   Prenatal Complications - Diabetes: none.     Blood pressure (!) 150/100, pulse 89, temperature 98.1 F (36.7 C), temperature source Oral, resp. rate 18, SpO2 98%, unknown if currently breastfeeding. Exam Physical Exam Constitutional:      General: She is not in acute distress.    Appearance: She is well-developed.  HENT:  Head: Normocephalic and atraumatic.  Eyes:     Pupils: Pupils are equal, round, and reactive to light.  Cardiovascular:     Rate and Rhythm: Normal rate and regular rhythm.     Heart sounds: No murmur heard.    No gallop.  Abdominal:     Tenderness: There is no abdominal tenderness. There is no guarding or rebound.  Genitourinary:    Vagina: Normal.  Musculoskeletal:        General: Normal range of motion.     Cervical back: Normal range of motion and neck supple.  Skin:    General: Skin is warm  and dry.  Neurological:     Mental Status: She is alert and oriented to person, place, and time.     Prenatal labs: ABO, Rh: --/--/O POS (10/03 1011) Antibody: NEG (10/03 1011) Rubella: Immune (03/03 0000) RPR: NON REACTIVE (10/03 1005)  HBsAg: Negative (03/03 0000)  HIV: Non-reactive (03/03 0000)  GBS:   neg  Assessment/Plan: This is a 32yo [id number] @ 39 3/7 by 8wk TVUS NTO c/w LMP admitted for scheduled ERLTCS/BS. R/B/A of cesarean section discussed with patient. Alternative would be vaginal delivery which would mean shorter postpartum stay and decreased risk of bleeding. Risks of section include infection of the uterus, pelvic organs, or skin, inadvertent injury to internal organs, such as bowel or bladder. If there is major injury, extensive surgery may be required. If injury is minor, it may be treated with relative ease. Discussed possibility of excessive blood loss and transfusion. If bleeding cannot be controlled using medical or minor surgical methods, a cesarean hysterectomy may be performed which would mean no future fertility. Patient accepts the possibility of blood transfusion, if necessary. Patient understands and agrees to move forward with section.   [name] [name] 01/08/2024, 9:57 AM

## 2024-01-08 NOTE — Plan of Care (Signed)
  Problem: Education: Goal: Knowledge of General Education information will improve Description: Including pain rating scale, medication(s)/side effects and non-pharmacologic comfort measures Outcome: Progressing   Problem: Health Behavior/Discharge Planning: Goal: Ability to manage health-related needs will improve Outcome: Progressing   Problem: Clinical Measurements: Goal: Ability to maintain clinical measurements within normal limits will improve Outcome: Progressing Goal: Will remain free from infection Outcome: Progressing Goal: Diagnostic test results will improve Outcome: Progressing Goal: Respiratory complications will improve Outcome: Progressing Goal: Cardiovascular complication will be avoided Outcome: Progressing   Problem: Activity: Goal: Risk for activity intolerance will decrease Outcome: Progressing   Problem: Nutrition: Goal: Adequate nutrition will be maintained Outcome: Progressing   Problem: Coping: Goal: Level of anxiety will decrease Outcome: Progressing   Problem: Elimination: Goal: Will not experience complications related to bowel motility Outcome: Progressing Goal: Will not experience complications related to urinary retention Outcome: Progressing   Problem: Pain Managment: Goal: General experience of comfort will improve and/or be controlled Outcome: Progressing   Problem: Safety: Goal: Ability to remain free from injury will improve Outcome: Progressing   Problem: Skin Integrity: Goal: Risk for impaired skin integrity will decrease Outcome: Progressing   Problem: Education: Goal: Knowledge of Childbirth will improve Outcome: Progressing Goal: Ability to make informed decisions regarding treatment and plan of care will improve Outcome: Progressing Goal: Ability to state and carry out methods to decrease the pain will improve Outcome: Progressing Goal: Individualized Educational Video(s) Outcome: Progressing   Problem:  Coping: Goal: Ability to verbalize concerns and feelings about labor and delivery will improve Outcome: Progressing   Problem: Life Cycle: Goal: Ability to make normal progression through stages of labor will improve Outcome: Progressing Goal: Ability to effectively push during vaginal delivery will improve Outcome: Progressing   Problem: Role Relationship: Goal: Will demonstrate positive interactions with the child Outcome: Progressing   Problem: Safety: Goal: Risk of complications during labor and delivery will decrease Outcome: Progressing   Problem: Pain Management: Goal: Relief or control of pain from uterine contractions will improve Outcome: Progressing   Problem: Education: Goal: Knowledge of the prescribed therapeutic regimen will improve Outcome: Progressing Goal: Understanding of sexual limitations or changes related to disease process or condition will improve Outcome: Progressing Goal: Individualized Educational Video(s) Outcome: Progressing   Problem: Self-Concept: Goal: Communication of feelings regarding changes in body function or appearance will improve Outcome: Progressing   Problem: Skin Integrity: Goal: Demonstration of wound healing without infection will improve Outcome: Progressing   Problem: Education: Goal: Knowledge of condition will improve Outcome: Progressing Goal: Individualized Educational Video(s) Outcome: Progressing Goal: Individualized Newborn Educational Video(s) Outcome: Progressing   Problem: Activity: Goal: Will verbalize the importance of balancing activity with adequate rest periods Outcome: Progressing Goal: Ability to tolerate increased activity will improve Outcome: Progressing   Problem: Coping: Goal: Ability to identify and utilize available resources and services will improve Outcome: Progressing   Problem: Life Cycle: Goal: Chance of risk for complications during the postpartum period will decrease Outcome:  Progressing   Problem: Role Relationship: Goal: Ability to demonstrate positive interaction with newborn will improve Outcome: Progressing   Problem: Skin Integrity: Goal: Demonstration of wound healing without infection will improve Outcome: Progressing

## 2024-01-09 LAB — COMPREHENSIVE METABOLIC PANEL WITH GFR
ALT: 9 U/L (ref 0–44)
AST: 16 U/L (ref 15–41)
Albumin: 2.1 g/dL — ABNORMAL LOW (ref 3.5–5.0)
Alkaline Phosphatase: 86 U/L (ref 38–126)
Anion gap: 6 (ref 5–15)
BUN: 7 mg/dL (ref 6–20)
CO2: 22 mmol/L (ref 22–32)
Calcium: 8.1 mg/dL — ABNORMAL LOW (ref 8.9–10.3)
Chloride: 107 mmol/L (ref 98–111)
Creatinine, Ser: 0.67 mg/dL (ref 0.44–1.00)
GFR, Estimated: >60 mL/min (ref >60)
Glucose, Bld: 111 mg/dL — ABNORMAL HIGH (ref 70–99)
Potassium: 4 mmol/L (ref 3.5–5.1)
Sodium: 135 mmol/L (ref 135–145)
Total Bilirubin: 0.5 mg/dL (ref 0.0–1.2)
Total Protein: 4.7 g/dL — ABNORMAL LOW (ref 6.5–8.1)

## 2024-01-09 LAB — CBC
HCT: 25.1 % — ABNORMAL LOW (ref 36.0–46.0)
HCT: 25.4 % — ABNORMAL LOW (ref 36.0–46.0)
Hemoglobin: 7.9 g/dL — ABNORMAL LOW (ref 12.0–15.0)
Hemoglobin: 7.9 g/dL — ABNORMAL LOW (ref 12.0–15.0)
MCH: 25.6 pg — ABNORMAL LOW (ref 26.0–34.0)
MCH: 25.3 pg — ABNORMAL LOW (ref 26.0–34.0)
MCHC: 31.5 g/dL (ref 30.0–36.0)
MCHC: 31.1 g/dL (ref 30.0–36.0)
MCV: 81.5 fL (ref 80.0–100.0)
MCV: 81.4 fL (ref 80.0–100.0)
Platelets: 225 K/uL (ref 150–400)
Platelets: 236 K/uL (ref 150–400)
RBC: 3.08 MIL/uL — ABNORMAL LOW (ref 3.87–5.11)
RBC: 3.12 MIL/uL — ABNORMAL LOW (ref 3.87–5.11)
RDW: 14.8 % (ref 11.5–15.5)
RDW: 15 % (ref 11.5–15.5)
WBC: 11.2 K/uL — ABNORMAL HIGH (ref 4.0–10.5)
WBC: 8.7 K/uL (ref 4.0–10.5)
nRBC: 0 % (ref 0.0–0.2)
nRBC: 0 % (ref 0.0–0.2)

## 2024-01-09 LAB — MAGNESIUM: Magnesium: 3.9 mg/dL — ABNORMAL HIGH (ref 1.7–2.4)

## 2024-01-09 MED ORDER — SODIUM CHLORIDE 0.9 % IV SOLN
500.0000 mg | Freq: Once | INTRAVENOUS | Status: AC
  Administered 2024-01-09: 500 mg via INTRAVENOUS
  Filled 2024-01-09: qty 25

## 2024-01-09 NOTE — Lactation Note (Signed)
 This note was copied from a baby's chart. Lactation Consultation Note  Patient Name: Felicia Young Date: 01/09/2024  Age:34 hours, see [name]: MR hx PCOS, on MgSo4, C/S delivery and questionable infant's birth weight due weight loss of 10.74% at 29 hours of life. See flow sheet infant had adequate stools and voids.  P4,  Per [name], infant recently breastfeed for 25 minutes and afterwards was supplemented with 20 kcal formula prior to [facility] entering the room.  [name] feels infant is improving with his latch. LC did not observe latch at this time. [name] plans to pump every 3 hours for 15 minutes to help establish and stimulate her milk supply. [name] does not have any questions or concerns for LC at this time. [name] given handout with Feeding Guidelines, [name] plans to continue to supplement infant after each feeding until infant's weight is stabilized. [name] knows to call [facility] services if she has any questions, concerns or need latch assistance.  Current Feeding Plan: Day 2 of life 1- [name] will continue to breastfeed infant by cues, on demand, every 2-3 hours, skin to skin. 2- [name] will offer infant any EBM first and then 20 kcal per feeding. 3- [name] will continue to use the DEBP every 3 hours for 15 minutes on initial setting. 4- [name] knows to call [facility] services if she has any questions, concerns or need latch assistance.   Maternal Data    Feeding Nipple Type: Slow - flow  LATCH Score                    Lactation Tools Discussed/Used    Interventions    Discharge    Consult Status      Felicia Young 01/09/2024, 4:49 PM

## 2024-01-09 NOTE — Social Work (Signed)
 CSW acknowledged consult for anxiety, depression and bipolar. [name] currently receiving magnesium infusion until 2215.  CSW will attempt to meet with [name] at a later time.   Nat Quiet, MSW, LCSW Clinical Social Worker  [phone] 01/09/2024  11:35 AM

## 2024-01-09 NOTE — Lactation Note (Signed)
 This note was copied from a baby's chart. Lactation Consultation Note  Patient Name: Felicia Young Date: 01/09/2024 Age:34 hours Reason for consult: Initial assessment;Term  P4. Mom only BF 3rd child for a few months. Mom wants to BF this baby.  Mom stated the baby will latch and suckle some then pop off mad and screaming. LC of baby to latch on opposite breast and she did the same thing.  As she's suckling she starts pulling back on nipple then looking around as she is continuously sucking. Then she pops off screaming and the only way to calm her is to remove her from the breast. LC could hear baby's stomach growling.  Mom encouraged to feed baby 8-12 times/24 hours and with feeding cues.  LC asked mom about pumping mom in agreement. Mom shown how to use DEBP & how to disassemble, clean, & reassemble parts. Mom pumping when LC left. Suggest pump every 3 hrs. LC is going to send #18 flanges in tube system.  Mom is BF/formula feeding. Mom asked for formula. LC gave 1 bottle to mom.  Reported to RN on consult. Maternal Data Has patient been taught Hand Expression?: Yes Does the patient have breastfeeding experience prior to this delivery?: Yes How long did the patient breastfeed?: few months to her last child now 20 months, pump and bottle fed.  Feeding Nipple Type: Slow - flow  LATCH Score Latch: Grasps breast easily, tongue down, lips flanged, rhythmical sucking.  Audible Swallowing: None  Type of Nipple: Everted at rest and after stimulation  Comfort (Breast/Nipple): Soft / non-tender  Hold (Positioning): Assistance needed to correctly position infant at breast and maintain latch.  LATCH Score: 7   Lactation Tools Discussed/Used Tools: Pump;Flanges Flange Size: 18 Breast pump type: Double-Electric Breast Pump Pump Education: Setup, frequency, and cleaning;Milk Storage Reason for Pumping: PCOS/stimulation Pumping frequency: q 3  hr  Interventions Interventions: Breast feeding basics reviewed;Assisted with latch;Skin to skin;Breast massage;Hand express;Breast compression;Adjust position;Support pillows;Position options;DEBP;Pace feeding;Education;LC Services brochure  Discharge Pump: DEBP (spectra )  Consult Status Consult Status: Follow-up Date: 01/09/24 Follow-up type: In-patient    Sacoya Mcgourty G 01/09/2024, 12:00 AM

## 2024-01-09 NOTE — Lactation Note (Signed)
 This note was copied from a baby's chart. Lactation Consultation Note  Patient Name: Felicia Young Unijb'd Date: 01/09/2024 Age:34 hours, P4 2nd time Breast feeding  Reason for consult: Follow-up assessment;Term;Infant weight loss;Maternal endocrine disorder (11 % weight loss,) Baby sound asleep on moms chest.  Per mom baby has recently been fed 30 ml around 7:30 am.  Per mom pumped with the DEBP around that time. LC reviewed and updated the doc flow sheets per parents.  LC provided the #18 F's per the night LC's recommendations.  Parents are aware to feed with cues and by 3 hours STS .  LC encouraged mom to call for latch assessment.   Maternal Data Has patient been taught Hand Expression?: Yes (per mom was leaking during her pregnancy) Does the patient have breastfeeding experience prior to this delivery?: Yes  Feeding Mother's Current Feeding Choice: Breast Milk and Formula Nipple Type: Slow - flow  LATCH Score - 9-7     Lactation Tools Discussed/Used Tools: Pump;Flanges Flange Size: 18 Breast pump type: Double-Electric Breast Pump;Manual Pump Education: Setup, frequency, and cleaning;Milk Storage Pumped volume:  (per mom pumped this am when the baby to sluggish  to latch, and dad fed the bottle)  Interventions  Review of education   Discharge Pump: DEBP;Personal (per mom Spectra ) WIC Program: No  Consult Status Consult Status: Follow-up Date: 01/09/24 Follow-up type: In-patient    Rollene Caldron Surina Storts 01/09/2024, 8:31 AM

## 2024-01-09 NOTE — Anesthesia Postprocedure Evaluation (Signed)
 Anesthesia Post Note  Patient: Felicia Young  Procedure(s) Performed: CESAREAN SECTION, WITH BILATERAL TUBAL LIGATION (Abdomen)     Patient location during evaluation: PACU Anesthesia Type: Spinal Level of consciousness: sedated and patient cooperative Pain management: pain level controlled Vital Signs Assessment: post-procedure vital signs reviewed and stable Respiratory status: spontaneous breathing Cardiovascular status: stable Anesthetic complications: no   No notable events documented.  Last Vitals:  Vitals:   01/09/24 1527 01/09/24 1535  BP: 132/68   Pulse: 91   Resp: 18 18  Temp: 36.5 C   SpO2: 100%     Last Pain:  Vitals:   01/09/24 1535  TempSrc:   PainSc: 0-No pain                 [name]

## 2024-01-09 NOTE — Progress Notes (Addendum)
  Hg stable/ unchanged.   Continue with routine pp/post op care Continue with venofer infusion    Patient ID: Felicia Young, female   DOB: Dec 20, 1990, 33 y.o.   MRN: [id number] I was called and informed pt had lightheadedness and SOB when attempt made to have her stand for the second time today.  No tachycardia when resting in bed.  Sx improved with rest  Pt was during her second hour of iv venofer infusion  I ordered a cbc to be drawn to recheck her Hg level - plan to consider replacements of prbcs if Hg lower.

## 2024-01-09 NOTE — Progress Notes (Addendum)
 Subjective: Postpartum Day 1: Cesarean Delivery Patient reports tolerating PO. She reports had tunnel vision, muffled auditory perception, felt hot and clammy when nurse attempted to help her stand up this am. She feels well while laying in bed now. She denies HA or CP. Admits to fatigue.  She is bonding well with baby; working on latching.   Objective: Vital signs in last 24 hours: Temp:  [96.5 F (35.8 C)-98.5 F (36.9 C)] 97.6 F (36.4 C) (10/07 0647) Pulse Rate:  [65-98] 71 (10/07 0608) Resp:  [12-19] 16 (10/07 0745) BP: (110-162)/(61-106) 112/61 (10/07 0608) SpO2:  [97 %-100 %] 100 % (10/07 0550) Weight:  [100.7 kg] 100.7 kg (10/06 1511)  Physical Exam:  General: alert, cooperative, no distress, and pale Lochia: appropriate Uterine Fundus: firm Incision: no significant drainage DVT Evaluation: No evidence of DVT seen on physical exam. Scds in place  Recent Labs    01/08/24 2249 01/09/24 0511  HGB 8.3* 7.9*  HCT 26.2* 25.1*    Assessment/Plan: Status post Cesarean section. Postoperative course complicated by severe range BPs treated with 30xl procardia ( received at 5:30am today) and magnesium sulfate for seizure prophylaxis ( end time tonight at 22:15)- well controlled Dizziness - will reassess orthostatics after pt has breakfast. Differential includes medication induced sx vs fatigue vs symptomatic anemia . If determined to be the latter discussed plan for iv iron infusion and pt agreeable  Continue current care.  Ted LELON Solo, DO 01/09/2024, 8:52 AM

## 2024-01-10 LAB — BIRTH TISSUE RECOVERY COLLECTION (PLACENTA DONATION)

## 2024-01-10 LAB — SURGICAL PATHOLOGY

## 2024-01-10 NOTE — Lactation Note (Signed)
 This note was copied from a baby's chart. Lactation Consultation Note  Patient Name: Felicia Young Date: 01/10/2024 Age:34 hours Reason for consult: Follow-up assessment;Term;Infant weight loss;Maternal endocrine disorder GHTN on magnesium sulfate  LC in to visit with P4 Mom of baby Felicia Young born at term by C/Section. Magnesium sulfate was DC'd last evening and Mom is starting to feel more alert and well. Baby's weight has been stable at 11.5%, down only 31 gm from yesterday.  Concern that birth weight was not accurate.   Output has been WNL.    Mom is latching baby to the breast, but not consistently.  Pump set up in room, but Mom has not pumped.  LC informed Mom about colostrum protectors being used for 24 hrs only and then needs to be discarded.  Baby awake in crib.  [name] swaddling her.  LC offered to assist Mom with positioning and latching prn, encouraged her to call for breastfeeding help.  Mom denies feeling any breast changes.  Plan recommended- 1- STS with Felicia Young as much as possible 2-Offer the breast with feeding cues, goal is 8-12 times per 24 hrs 3- If baby is supplemented by bottle, LC recommended pumping on initiation setting for 15 mins to support her milk supply.  4- ask for lactation prn  Lactation Tools Discussed/Used Tools: Pump;Flanges;Bottle Flange Size: 18 Breast pump type: Double-Electric Breast Pump;Manual Pump Education: Setup, frequency, and cleaning;Milk Storage Reason for Pumping: support milk supply Pumping frequency: No pumping yet, Mom encouraged to pump when baby receives supplemental bottle Pumped volume: 0 mL  Interventions Interventions: Breast feeding basics reviewed;Skin to skin;Breast massage;Hand express;DEBP;Education  Discharge Discharge Education: Engorgement and breast care Pump: DEBP;Personal (Spectra )  Consult Status Consult Status: Follow-up Date: 01/11/24 Follow-up type: In-patient    Felicia Young 01/10/2024, 10:36 AM

## 2024-01-10 NOTE — Progress Notes (Addendum)
 Patient ID: Felicia Young, female   DOB: December 27, 1990, 33 y.o.   MRN: [id number] Magnesium sulfate discontinued after 24hrs ( 2200 last night). Pts BP has been in normal range since then - well controlled on 30xl procardia daily: 125-130/68-72  Plan : routine pp/post op care

## 2024-01-10 NOTE — Progress Notes (Addendum)
 Subjective: Postpartum Day 2: Cesarean Delivery Patient reports feeling more like herself now that the magnesium sulfate has been discontinued.  Patient reports adequate pain control.  Ambulating and voiding without difficulty.  Objective: Vital signs in last 24 hours: Temp:  [97.4 F (36.3 C)-98.1 F (36.7 C)] 97.7 F (36.5 C) (10/08 0800) Pulse Rate:  [81-92] 87 (10/08 0800) Resp:  [16-20] 18 (10/08 0800) BP: (125-138)/(68-81) 138/79 (10/08 0800) SpO2:  [96 %-100 %] 98 % (10/08 0800)  Physical Exam:  General: alert, cooperative, and appears stated age Lochia: appropriate Uterine Fundus: firm Incision: healing well DVT Evaluation: No evidence of DVT seen on physical exam.  Recent Labs    01/09/24 0511 01/09/24 1521  HGB 7.9* 7.9*  HCT 25.1* 25.4*    Assessment/Plan: Status post Cesarean section. Doing well postoperatively.  Continue current care. Mild preeclampsia: Magnesium sulfate off since 11 PM on 01/09/2024.  Patient's blood pressures have been normotensive since then.  Currently the patient is on Procardia 30 mg XL. Anticipate discharge tomorrow  Marjorie Gull, MD 01/10/2024, 11:33 AM

## 2024-01-10 NOTE — Clinical Social Work Maternal (Signed)
 CLINICAL SOCIAL WORK MATERNAL/CHILD NOTE  Patient Details  Name: Felicia Young MRN: [id number] Date of Birth: 05/25/1990  Date:  2024-03-20  Clinical Social Worker Initiating Note:  Nat Quiet, [place] Date/Time: Initiated:  01/10/24/1404     Child's Name:  Felicia Young   Biological Parents:  Mother, Father (Mother: Felicia Young 05/22/1989, Felicia Young 07/25/1990)   Need for Interpreter:  None   Reason for Referral:  Behavioral Health Concerns   Address:  [address] [place] [address]    Phone number:  [phone] (home)     Additional phone number:   Household Members/Support Persons (HM/SP):   Household Member/Support Person 1, Household Member/Support Person 2, Household Member/Support Person 3, Household Member/Support Person 4, Household Member/Support Person 5   HM/SP Name Relationship DOB or Age  HM/SP -1 Medina Degraffenreid Spouse 07/25/1990  HM/SP -2 Felicia Young Son 7  HM/SP -3 Felicia Young Daugher 10  HM/SP -4 Felicia Young Son 63 month old  HM/SP -5        HM/SP -6        HM/SP -7        HM/SP -8          Natural Supports (not living in the home):  Extended Family, Immediate Family   Professional Supports: Other (Comment) (Psychiatrist: Dr. Geoffry)   Employment: Homemaker   Type of Work:     Education:  Other (comment) ([employer])   Homebound arranged:    Surveyor, quantity Resources:  Medicaid, Media planner    Other Resources:  [facility]   Cultural/Religious Considerations Which May Impact Care:    Strengths:  Ability to meet basic needs  , Home prepared for child  , Psychotropic Medications   Psychotropic Medications:  Zoloft, Lamictal, Vyvanse      Pediatrician:     Dr. Marrie  Pediatrician List:   [facility]   [place]    [place]    [place] [facility]      Pediatrician Fax Number:    Risk Factors/Current Problems:  Mental Health Concerns      Cognitive State:  Able to Concentrate  , Alert     Mood/Affect:  Calm  , Comfortable  , Interested     CSW Assessment: CSW received consult for anxiety, depression and bipolar. CSW met with [name] to offer support and complete assessment.   CSW met with [name] at bedside and introduced CSW role. [name] appeared calm, pleasant and engaged with CSW during the visit. [name] and maternal grandmother were present at bedside. CSW offered [name] privacy and [name] gave CSW permission to share all information with [name] present and maternal grandmother present. CSW asked [name] how she had been feeling. [name] expressed that she felt emotionally well since completing the magnesium infusion and was feeling thankful and blessed for her baby girl. CSW inquired about [name]'s noted mental health history. [name] acknowledged having diagnoses for bipolar depression, anxiety, PTSD and ADHD. She reported that she currently treats her symptoms with medications, Vyvanse, Sertraline, and Lamictal which she feels works well. [name] reported that she is followed by psychiatrist Dr. Geoffry whom she has a good rapport and feels comfortable reaching out to him if concerns arise. [name] shared that she experienced postpartum depression and anxiety with her previous children and is scheduled to see Dr. Geoffry in six weeks. [name] denied experiencing current postpartum depression/anxiety symptoms. CSW inquired about [name]'s supports system. [name] identified  [name], her parents and in-laws as her primary sources of support.  CSW provided education regarding the baby blues period vs. perinatal mood disorders, discussed treatment and gave additional resources for mental health follow up if concerns arise. CSW recommended [name] complete a self-evaluation during the postpartum time period using the New Mom Checklist from Postpartum Progress and encouraged [name] to contact a medical professional if symptoms are noted at any time. CSW assessed [name] for safety. [name] denied SI/HI.  CSW  inquired if [name] had essential items to care for her infant. [name] reported that she has essential items to care for her infant including a crib and car seat. CSW provided review of Sudden Infant Death Syndrome (SIDS) precautions. [name] verbalized understanding. CSW inquired if [name] had chosen a pediatrician.[name] reported that she had chosen a pediatrician at [facility]  CSW identifies no further need for intervention and no barriers to discharge at this time.  CSW Plan/Description:  Psychosocial Support and Ongoing Assessment of Needs, No Further Intervention Required/No Barriers to Discharge, Perinatal Mood and Anxiety Disorder (PMADs) Education    Nat DELENA Quiet, LCSW 09-27-23, 2:22 PM

## 2024-01-11 MED ORDER — NIFEDIPINE ER 30 MG PO TB24: 30.0000 mg | ORAL_TABLET | Freq: Every day | ORAL | 2 refills | Status: AC

## 2024-01-11 MED ORDER — SIMETHICONE 80 MG PO CHEW: 80.0000 mg | CHEWABLE_TABLET | Freq: Three times a day (TID) | ORAL | Status: AC

## 2024-01-11 MED ORDER — IRON (FERROUS SULFATE) 325 (65 FE) MG PO TABS: 1.0000 | ORAL_TABLET | ORAL | Status: AC

## 2024-01-11 MED ORDER — IBUPROFEN 600 MG PO TABS
600.0000 mg | ORAL_TABLET | Freq: Four times a day (QID) | ORAL | Status: AC
Start: 2024-01-11 — End: ?

## 2024-01-11 MED ORDER — ACETAMINOPHEN 325 MG PO TABS: 650.0000 mg | ORAL_TABLET | Freq: Four times a day (QID) | ORAL | Status: AC | PRN

## 2024-01-11 MED ORDER — OXYCODONE HCL 5 MG PO TABS: 5.0000 mg | ORAL_TABLET | ORAL | 0 refills | Status: AC | PRN

## 2024-01-11 NOTE — Progress Notes (Signed)
 Subjective: Postpartum Day 3: Cesarean Delivery  Patient is doing well. Reports adequate pain control with PO medication.  Ambulating without lightheadedness or dizziness. Voiding without difficulty. Passing flatus. Tolerating regular diet without N/V. Denies HA, visual changes, CP, SOB or RUQ pain.   Is breast-feeding without difficulty.   Objective: Vital signs in last 24 hours: Temp:  [98.2 F (36.8 C)-98.3 F (36.8 C)] 98.2 F (36.8 C) (10/08 2037) Pulse Rate:  [85-99] 85 (10/09 0500) Resp:  [16-18] 16 (10/09 0500) BP: (133-144)/(68-82) 133/68 (10/09 0500) SpO2:  [98 %-100 %] 99 % (10/09 0500)  Physical Exam:  General: alert, cooperative, and appears stated age 33: appropriate Uterine Fundus: firm Incision: healing well, honey-comb dressing overtop, can be removed today  DVT Evaluation: No evidence of DVT seen on physical exam.  Recent Labs    01/09/24 0511 01/09/24 1521  HGB 7.9* 7.9*  HCT 25.1* 25.4*    Assessment/Plan: Status post Cesarean section. Doing well postoperatively. Meeting milestones.Discharge today.  #Mild preeclampsia: s/p Magnesium (off since 11 PM on 01/09/2024).  Patient's blood pressures have been normotensive-mild range since then.  Currently the patient is on Procardia 30 mg XL. #Acute blood loss anemia, clinically significant for this admission - will start every other day PO iron   Charmaine CHRISTELLA Oz, MD 01/11/2024, 7:35 AM

## 2024-01-11 NOTE — Lactation Note (Signed)
 This note was copied from a baby's chart. Lactation Consultation Note  Patient Name: Felicia Young Date: 01/11/2024 Age:34 hours Reason for consult: Follow-up assessment;Other (Comment);Term;Maternal endocrine disorder (- 12.68% WL)  Visited with family of 75 43/27 weeks old female Felicia Young; [name] is a P4 and reported she has been taking baby, she's also pumped and continues supplementing with Similac 20 calorie formula per feeding choice on admission. Noticed that pumping and latching are not consistent. Parents are taking baby Felicia Young home today. Reviewed discharge education and the importance of consistent nipple stimulation through latching/pumping for the onset of secretory activation and the prevention of engorgement. She politely declined a referral to [facility] OP but has their contact info in case she needs to reach out. Encouraged +8 feedings/24 hors or sooner if feeding cues are present and to pump whenever baby is taking a bottle. [name] present and supportive. All questions and concerns answered, family to contact [facility] services PRN.  Feeding Mother's Current Feeding Choice: Breast Milk and Formula Nipple Type: Slow - flow  Lactation Tools Discussed/Used Tools: Pump;Flanges Flange Size: 18 Breast pump type: Double-Electric Breast Pump Pump Education: Setup, frequency, and cleaning;Milk Storage Reason for Pumping: support milk supply Pumping frequency: 1 time/24 hours; encouraged pumping whenever baby is taking a bottle Pumped volume:  (drops)  Interventions Interventions: Breast feeding basics reviewed;DEBP;Education  Discharge Discharge Education: Engorgement and breast care;Warning signs for feeding baby;Outpatient recommendation Pump: DEBP;Personal (Spectra  S2)  Consult Status Consult Status: Complete Date: 01/11/24 Follow-up type: Call as needed   Kalon Erhardt GORMAN Crate 01/11/2024, 11:32 AM

## 2024-01-11 NOTE — Discharge Summary (Signed)
 Postpartum Discharge Summary    Patient Name: Felicia Young DOB: 12-Feb-1991 MRN: [id number]  Date of admission: 01/08/2024 Delivery date:01/08/2024 Delivering provider: SUDIE [name] Date of discharge: 01/11/2024  Admitting diagnosis: Encounter for sterilization [Z30.2] History of cesarean section [Z98.891] Intrauterine pregnancy: [redacted]w[redacted]d     Secondary diagnosis:  Principal Problem:   History of cesarean section  Additional problems:  Depression/ADHD - Lamictal /sertraline /Vyvanse  maternal obesity - BMI 32 Short interval pregnancy - delivered 05/2022    Discharge diagnosis:  Repeat Cesarean Section w/ Bilateral Salpingectomy Pre-eclampsia with Severe Features  Acute blood loss anemia, clinically significant for this admission Depression/ADHD Maternal obesity - BMI 32                                                Complications: None  Hospital course: Sceduled C/S   33 y.o. yo G4P4004 at [redacted]w[redacted]d was admitted to the hospital 01/08/2024 for scheduled cesarean section with the following indication:Elective Repeat.Delivery details are as follows:   Delivery Method:C-Section, Low Transverse Operative Delivery:N/A Findings:Normal appearing uterus, bilateral fallopian tubes and ovaries. Scarred subcutaneous tissue to fascia and scarring to rectus bellies. No intra-abdominal adhesions noted.   Details of operation can be found in separate operative note.    Patient had a postpartum course complicated by pre-eclampsia with severe features and acute blood loss anemia, clinically significant for this admission. She was administered 24 hours of magnesium sulfate for seizure prophylaxis and started on procardia 30 mg XL. BP remained normotensive to low mild range, and she denies HA, vision changes, CP, SOB or RUQ pain. She was started on every other day PO iron supplementation.  She is ambulating, tolerating a regular diet, passing flatus, and urinating well. Patient is discharged home in stable  condition on  01/11/24        Newborn Data: Birth date:01/08/2024 Birth time:10:49 AM Gender:Female Living status:Living Apgars:8 ,9  Weight:4180 g    Magnesium Sulfate received: Yes: Seizure prophylaxis BMZ received: No Rhophylac:N/A MMR:N/A T-DaP:Given prenatally Flu: declined RSV Vaccine received: declined Transfusion:No Immunizations administered: Immunization History  Administered Date(s) Administered   HPV Quadrivalent 04/04/2008   Influenza Inj Mdck Quad Pf 01/31/2022   Influenza, Quadrivalent, Recombinant, Inj, Pf 03/16/2018   Influenza, Seasonal, Injecte, Preservative Fre 03/07/2013   Influenza,inj,Quad PF,6+ Mos 04/27/2016   Td 04/04/2008   Tdap 10/25/2013, 08/30/2016, 02/23/2022    Physical exam  Vitals:   01/10/24 1552 01/10/24 2037 01/11/24 0000 01/11/24 0500  BP: (!) 142/73 (!) 144/81 133/82 133/68  Pulse: 96 91 89 85  Resp: 18 18 16 16   Temp: 98.2 F (36.8 C) 98.2 F (36.8 C)    TempSrc: Oral Oral    SpO2: 98% 99% 100% 99%  Weight:      Height:       General: alert, cooperative, and no distress Lochia: appropriate Uterine Fundus: firm Incision: Healing well with no significant drainage, Dressing is clean, dry, and intact DVT Evaluation: No evidence of DVT seen on physical exam. Labs: Lab Results  Component Value Date   WBC 8.7 01/09/2024   HGB 7.9 (L) 01/09/2024   HCT 25.4 (L) 01/09/2024   MCV 81.4 01/09/2024   PLT 236 01/09/2024      Latest Ref Rng & Units 01/09/2024    5:11 AM  CMP  Glucose 70 - 99 mg/dL 888   BUN 6 - 20  mg/dL 7   Creatinine 9.55 - 8.99 mg/dL 9.32   Sodium 864 - 854 mmol/L 135   Potassium 3.5 - 5.1 mmol/L 4.0   Chloride 98 - 111 mmol/L 107   CO2 22 - 32 mmol/L 22   Calcium 8.9 - 10.3 mg/dL 8.1   Total Protein 6.5 - 8.1 g/dL 4.7   Total Bilirubin 0.0 - 1.2 mg/dL 0.5   Alkaline Phos 38 - 126 U/L 86   AST 15 - 41 U/L 16   ALT 0 - 44 U/L 9    Edinburgh Score:    01/08/2024    5:00 PM  Edinburgh Postnatal  Depression Scale Screening Tool  I have been able to laugh and see the funny side of things. 0  I have looked forward with enjoyment to things. 0  I have blamed myself unnecessarily when things went wrong. 1  I have been anxious or worried for no good reason. 2  I have felt scared or panicky for no good reason. 0  Things have been getting on top of me. 1  I have been so unhappy that I have had difficulty sleeping. 0  I have felt sad or miserable. 0  I have been so unhappy that I have been crying. 0  The thought of harming myself has occurred to me. 0  Edinburgh Postnatal Depression Scale Total 4      After visit meds:  Allergies as of 01/11/2024   No Known Allergies      Medication List     TAKE these medications    acetaminophen  325 MG tablet Commonly known as: TYLENOL  Take 2 tablets (650 mg total) by mouth every 6 (six) hours as needed for mild pain (pain score 1-3). What changed: reasons to take this   ibuprofen  600 MG tablet Commonly known as: ADVIL  Take 1 tablet (600 mg total) by mouth every 6 (six) hours.   Iron (Ferrous Sulfate) 325 (65 Fe) MG Tabs Take 1 tablet by mouth every other day.   lamoTRIgine  150 MG tablet Commonly known as: LAMICTAL  Take 1 tablet (150 mg total) by mouth daily.   Vyvanse  30 MG capsule Generic drug: lisdexamfetamine Take 1 capsule (30 mg total) by mouth daily.   lisdexamfetamine 30 MG capsule Commonly known as: Vyvanse  Take 1 capsule (30 mg total) by mouth daily. Start taking on: January 17, 2024   metoCLOPramide  10 MG tablet Commonly known as: REGLAN  Take 10 mg by mouth in the morning.   NIFEdipine 30 MG 24 hr tablet Commonly known as: ADALAT CC Take 1 tablet (30 mg total) by mouth daily. Start taking on: January 12, 2024   oxyCODONE  5 MG immediate release tablet Commonly known as: Oxy IR/ROXICODONE  Take 1-2 tablets (5-10 mg total) by mouth every 4 (four) hours as needed for moderate pain (pain score 4-6).   prenatal  multivitamin Tabs tablet Take 2 tablets by mouth daily at 12 noon. Gummy   sertraline  100 MG tablet Commonly known as: ZOLOFT  Take 1.5 tablets (150 mg total) by mouth daily.   simethicone  80 MG chewable tablet Commonly known as: MYLICON Chew 1 tablet (80 mg total) by mouth 3 (three) times daily after meals.               Discharge Care Instructions  (From admission, onward)           Start     Ordered   01/11/24 0000  No dressing needed        01/11/24  [id number]             Discharge home in stable condition Infant Feeding: Breast Infant Disposition:home with mother Discharge instruction: per After Visit Summary and Postpartum booklet. Activity: Advance as tolerated. Pelvic rest for 6 weeks.  Diet: routine diet Anticipated Birth Control: s/p BS Postpartum Appointment:4 weeks Additional Postpartum F/U: BP check 1 week Future Appointments: Future Appointments  Date Time Provider Department Center  02/26/2024 10:30 AM Cottle, Lorene KANDICE Raddle., MD [facility] None    01/11/2024 Charmaine CHRISTELLA Oz, MD

## 2024-01-15 ENCOUNTER — Telehealth: Payer: Self-pay | Admitting: Psychiatry

## 2024-01-15 NOTE — Telephone Encounter (Signed)
 Pt called stating she gave birth week ago. Has decided for her mental health not to nurse. Would like to know when she can start Abilify . Also, increase Vyvanse . PT # [phone] Apt 11/24

## 2024-01-16 ENCOUNTER — Other Ambulatory Visit: Payer: Self-pay | Admitting: Psychiatry

## 2024-01-16 DIAGNOSIS — F902 Attention-deficit hyperactivity disorder, combined type: Secondary | ICD-10-CM

## 2024-01-16 MED ORDER — LISDEXAMFETAMINE DIMESYLATE 50 MG PO CAPS: 50.0000 mg | ORAL_CAPSULE | Freq: Every day | ORAL | 0 refills | Status: DC

## 2024-01-16 NOTE — Telephone Encounter (Signed)
 Ok resume Abilify  1/2 of 5 mg tablet now. Will increase Vyvanse  to 50 mg daily.  After a month then can increase to 70 mg daily.

## 2024-01-16 NOTE — Telephone Encounter (Addendum)
 Pt is PP and not breastfeeding. She is asking to restart Abilify  and Vyvanse . She was on 30 mg Vyvanse  while pregnant, was on 70 mg the month prior to pregnancy. She was taking 2.5 mg Abilify  prior to pregnancy.  She reports BPs in the hospital were elevated and she required magnesium.  10/8 BP 142/73 and 144/81 10/9 BP 133/82 and 133/68  LF Vyvanse  30 mg 9/27  [facility] [address]

## 2024-01-17 ENCOUNTER — Telehealth (HOSPITAL_COMMUNITY): Payer: Self-pay | Admitting: *Deleted

## 2024-01-17 NOTE — Telephone Encounter (Signed)
 01/17/2024  Name: Felicia Young MRN: [id number] DOB: 1990/11/08  Reason for Call:  Transition of Care Hospital Discharge Call  Contact Status: Patient Contact Status: Complete  Language assistant needed: Interpreter Mode: Interpreter Not Needed        Follow-Up Questions: Do You Have Any Concerns About Your Health As You Heal From Delivery?: No Do You Have Any Concerns About Your Infants Health?: No  Edinburgh Postnatal Depression Scale:  In the Past 7 Days: I have been able to laugh and see the funny side of things.: As much as I always could I have looked forward with enjoyment to things.: As much as I ever did I have blamed myself unnecessarily when things went wrong.: No, never I have been anxious or worried for no good reason.: Hardly ever I have felt scared or panicky for no good reason.: No, not much Things have been getting on top of me.: No, I have been coping as well as ever I have been so unhappy that I have had difficulty sleeping.: Not at all I have felt sad or miserable.: Not very often I have been so unhappy that I have been crying.: Only occasionally The thought of harming myself has occurred to me.: Never Edinburgh Postnatal Depression Scale Total: 4  PHQ2-9 Depression Scale:     Discharge Follow-up: Edinburgh score requires follow up?: No Patient was advised of the following resources:: Support Group, Breastfeeding Support Group  Post-discharge interventions: Reviewed Newborn Safe Sleep Practices  Steva Tammy PEAK  01/17/2024 1329

## 2024-01-17 NOTE — Telephone Encounter (Signed)
 Recommendations reviewed with patient. She doesn't need a RF of Abilify  yet and is not due for a RF of Vyvanse  until 10/25.

## 2024-01-26 NOTE — Telephone Encounter (Signed)
 Has RF available for Vyvanse  50 mg.

## 2024-02-05 ENCOUNTER — Other Ambulatory Visit: Payer: Self-pay | Admitting: Psychiatry

## 2024-02-05 DIAGNOSIS — F3181 Bipolar II disorder: Secondary | ICD-10-CM

## 2024-02-06 NOTE — Telephone Encounter (Signed)
 Pt reports she hasn't missed a day of Lamictal . I told her the last Rx sent was for 90 days in April so she should have been out. Sent note to pharmacy.

## 2024-02-06 NOTE — Telephone Encounter (Signed)
 She has been off of the lamotrigine  because of pregnancy.  Apparently she is asking to resume it.  Please verify that that is the case.  If that is correct and she is not currently taking lamotrigine , she needs to start lamotrigine  gradually in order to avoid the rash risk as much as possible.  Therefore send in prescription for lamotrigine  25 mg tablets.  With instructions 1 daily for 2 weeks, 2 daily for 2 weeks, 4 daily for 2 weeks, then 6 tablets daily which equals 150 mg daily.  Number 180 tablets with no refill

## 2024-02-26 ENCOUNTER — Ambulatory Visit (INDEPENDENT_AMBULATORY_CARE_PROVIDER_SITE_OTHER): Admitting: Psychiatry

## 2024-02-26 ENCOUNTER — Encounter: Payer: Self-pay | Admitting: Psychiatry

## 2024-02-26 DIAGNOSIS — F902 Attention-deficit hyperactivity disorder, combined type: Secondary | ICD-10-CM

## 2024-02-26 DIAGNOSIS — F4310 Post-traumatic stress disorder, unspecified: Secondary | ICD-10-CM

## 2024-02-26 DIAGNOSIS — F411 Generalized anxiety disorder: Secondary | ICD-10-CM | POA: Diagnosis not present

## 2024-02-26 DIAGNOSIS — F3181 Bipolar II disorder: Secondary | ICD-10-CM

## 2024-02-26 MED ORDER — LISDEXAMFETAMINE DIMESYLATE 50 MG PO CAPS: 50.0000 mg | ORAL_CAPSULE | Freq: Every day | ORAL | 0 refills | Status: AC

## 2024-02-26 NOTE — Progress Notes (Signed)
 Felicia Young [id number] 12-03-1990 33 y.o.    Subjective:   [name] is a 33 y.o. (DOB 11-Nov-1990) female.  Chief Complaint:  Chief Complaint  Patient presents with   Follow-up   Depression   Anxiety   ADD    Felicia Young presents to the office today for follow-up of Bipolar 2, and GAD with obsessional elements.    seen Nov 2020. Sertraline  was reduced from 150 to 100 to reduce sexual SE.  Tooo irritable after a month.  Better with 150 mg daily.  Irritability generally managed.  08/15/2019 appointment with the following noted: Pretty good overall.  Taking Lamotrigine  AM and noted fuse shorter and switched it to night and then insomnia.  Then switched lamotrigine  and sertraline  to AM and sleep and irritability are better now.  Less vivid dream which would before shake her up emotionally for a few days.  Periods of being overwhelmed still. Some days feels in quicksand and hard to get out of it and unmotivated and low energy.  Once going is OK.  Sometimes numbness to emotion esp re intimacy  With husband.  Low libido but doesn't interfere with pleasure in sex.   Anxiety pretty good except bothered by mask in a store.  Mostly DT the breathing issues. Zoloft  increased postpartum related to [name] birth she had flashbacks.   Especially with 2 small kids. 2 weeks gone back to gym   Less afternoon slump.  Better energy and motivation. Sleep 8 hours. Plan: Due to sexual side effects attempt transition from sertraline  to Viibryd .  09/12/2019 appointment select appointment phone call patient complaining of some tingling and dizziness immediately after transition to Viibryd  20 mg daily also with some sweating.  Did some improvement in more normal emotionality.  Was felt to could potentially be some residual symptoms of serotonin withdrawal and to take a low dose of sertraline  for 5 days and then stop it again.  09/27/2019 appointment with the following noted: Dizziness and  tingly resolved with switch to Viibryd  20 and extra sertraline . [name] is shorter than on Zoloft .  Quick to anger.  Positive however is feeling emotions otherwise more normally.  Tearful with happy things.  Less numbed.  More enjoyment and interest sexually but not a lot of motivation. More emotionally connected to husband. No mood swings. Feels more productive and energetic so far too. Plan: increasse Viibryd  to 30 mg to help irritability  12/02/19 appt with the following noted: Since last appt had to do peer-peer appeal of denial of coverage for Viibryrd which was successful. Now on Viibryd  40 mg daily for about a month. Doing pretty good with it.  Feels pretty level and better than at lower dose with less anger and short-fused and less irritable.  Don't feel like a zombie or super numb but don't feel like she'll snap.  Better with numbness vs Zoloft .  Less sexual SE than Zoloft  but kids interfere.   Not depressed.  Some anxiety.  Some worry over baby's breathing and will check repeatedly but usually can let go after a while. Plan no med changes  03/03/2020 appointment with the following noted: Called in Oct and wanted to swtich back to Zoloft  bc irritability and lack of patience and weird dreams. Episode of sleep paralysis when took it at night.  Happened 3 times with 3rd time the worst. On sertraline  100 irritability and NM resolved.  H noticed Zoloft  was better too.   Much more level.  Son disturbs sleep.   At first it felt more normal to be able to cry but that benefit was not worth the rest with Viibryd . Plan no med changes.  06/29/2020 appointment with the following noted: Depression after Xmas.   Struggles with Felicia Young with ADHD and psych med trials.  Got into place where felt alone and bad about herself and wanted to sleep a lot.   Not that dark in years.  A lot better now and happy now.  Felicia Young doing a lot better and pt feels better now too.  Wonders about increasing Zoloft  to 200 bc  did so when down and it seemed to help.  Feels more productive and happy with 200 mg daily and less triggered with mood.  More productive. Taking Zoloft  100 mg daily now. Plan: Increase Zoloft  to 150 mg  .  For reactivity and depression risk.   Cont lamotrigine  150 mg daily  03/15/21 appt noted: On sertraline  150 mg since here.   Hard time just go through motions.  Hard to enjoy. Tired a lot.  Depression.   Low motivation and productivity.  Still some irritabilty.  Sleep is normal unless son gets her up. Anxiety less of a problem. Plan: Abilify  faster vs duloxetine switch.  Abilify  potentiation. 2.5  up to 5 in a few days if needed.  05/11/2021 appointment with the following noted: Abilify  was the missing link and mother noticed benefit in a few days.   No longer feels like a shell and more like a person.  Better energy.  Better function.  Less guilt and negativity. No SE [name] travels rough when he's gone. D ADHD and suspected autism.  Pending evaluation.  Has OT.  Difficult. Felicia Young is 6 but has good memory. Patient reports stable mood and denies depressed or irritable moods.  Patient denies any recent difficulty with anxiety.  Patient denies difficulty with sleep initiation or maintenance. Denies appetite disturbance.  Patient reports that energy and motivation have been good.  Patient denies any difficulty with concentration.  Patient denies any suicidal ideation. Plan: No med changes.  Continue lamotrigine  150, Abilify  2.5, sertraline  150 mg daily  08/09/2021 appointment with the following noted: Doing pretty good.  No sig depression episodes.   Asks about ADHD.  Find it hard to get stuff done.  Task paralysis.  Initiative problems.  D has ADHD.  Has trouble getting things finished for example painting rooms.  Can get overstimulated with sound.  Overwhelmed with tasks and finishing them. Not losing things.  Getting things organized is difficult.   Driving is OK bc fear accidents.  Can  interrupt in conversations bc sense of urgency.   Had trouble with timed and other tests and would freeze up with getting overwhelmed. Fidgety.  Hard to sit and watch a movie without doing something else with her hands. Sleep 8 hours. B started Vyvanse  with benefit.  H doesn't have ADHD. P;an: Vyvanse  30 mg AM  10/13/21 appt noted: No Abilify . On sertraline  100 and lamotrigine  150.  On PNV. Vyvanse  changed my world.  Better function.  Less irritable.  Buffer now.  Less overwhelmed.  Mind quieter on Vyvanse . Pregnant as of July 1.  Talked to on call OB bc didn't want to give up the benefit. OB Ok'd lamotrigine  and sertraline  and low dose Vyvanse .  So can stay on it. SE less appetite but is eating normally meals. First US  tomorrow. Plan: No med changes.  Continue Vyvanse  30 mg every morning, sertraline   100 mg daily, lamotrigine  150 mg daily  01/13/22 appt noted: Med wise OK. Pregnant and misses Abilify  and doesn't feels she can go higher on Vyvanse .  Enough to get through pregnancy but looks forward to getting back on normal doses and increasing Vyvanse . Misses Abilify  enhancing Zoloft .  Higher doses Zoloft  don't help. Stress at home.  Marital issues which H doesn't see.   Pregnant [redacted] weeks.  C-section sched 05/17/22. No problems so far.  Boy. Exhausted with pregnancy and nausea with food aversions.  Not weighed herself in a couple of weeks.  Lost 10# since beginning of pregnancy. More pt on vyvanse  than before it.  06/27/22 appt noted: On sertraline  100 and lamotrigine  150, Vyvanse  30 AM Son born 6 weeks ago, Jayson.  Had 3rd C section.  More painful this time.  Better now.   D Petyon autistic and attached.  Son [name] doing well with it too. Mood hard time with separation from narcissistic H and dealing with it.  H has a GF.  Stress with credit card debt, got sued over it.  Father  paid it off.  Son's cat died abruptly.   Working on herself spiritually.  Has tried to reconcile with H.    Thinks felet better with Abilify  and and sertraline  150 with other meds. Plan: Ok to increase Vyvanse  40 mg AM Agree with increase sertraline  150 mg daily. Continue lamotrigine  150  TC 07/22/22: asked to increase Vyvanse  50.  Ok.   08/24/22 appt noted: Psych meds: Sertraline  150, lamotrigine  150 daily, Abilify  2.5 mg daily, Vyvanse  50 mg every morning She thinks Vyvanse  needs increase again bc more ADHD paralysis, shorter fuse, less focus. Overall still feels benefit Vyvanse . D Peyton good benefit with Concerta. No SE. Overall pleased with meds and balance.  Best mood than in years.  Looking forward to things.  Not struggling to get out of bed.  Happy. Working things out with Marolyn and better for 3 weeks. Working on pensions consultant.   Working back in [employer] group.   Plan: Ok increase Vyvanse  60 mg AM  10/24/22 appt noted: Psych meds:  Vyvanse  60 mg AM and Sertraline  150, lamotrigine  150 daily, Abilify  2.5 mg daily,  The perfect med cocktail.   Circumstantially a lot of stress but pushing through.  Father's health declining.  Close to him.    He might have CA and that could be triggering.  Caused a lot of anxiety bc mind goes to worse case scenario. More stable and functioning with meds.  He's a caring and giving man.  Stay with parents one night per week and otherwise with H's family.   Her B has moved in to help with father.  B moved from non-binary to taking hormones and is transgender and causing stress.  He does not push this on his D.   Hard not being with cats.  Asks for filling out forms for emotional support animals.  Looking to rent a house.  They relax her.  Cats Kristy and [employer].   [name] was born via emergency C-section November 18[birth year] 2016 and swallowed fluid resulting in hypoxia and a NICU stay of 5 days.  Patient had some PTSD symptoms thereafter but those have largely resolved. 3 children.  03/27/23 appt noted: She and H, [name], and family, got home just recently after H's uncle  died. Overall good but ADHD paralysis creeping back in.  Vyvanse  helps her be less overwhelmed.  Asks to increase Vyvanse  to 70.  No SE. Mood  stable.  Vyanse changed her life.   Satisfied by other meds. Stressors F cancer, B transgender recently  getting divorced from his wife..   Plan: For depressive sx  Abilify  2.5 mg daily..  Disc dosing  Cont lamotrigine  150 mg daily Zoloft  150 mg daily.  For reactivity and depression risk.  Disc risk mood cycling.  She and mother have done well with it. Asks to increase to max Vyvanse  70 mg AM  05/22/23 TC:  Patient reporting she is about [redacted] weeks pregnant.  She wants to know what dose of Vyvanse  is safe to take. At 12/23 visit she was asking to increase Vyvanse  to 70 mg.  She reports she had some 60 mg capsules left and was opening them and taking approximately 1/2 of the capsule. She reports doing this for 2 weeks.      MD:  With her last pregnancy, the OB was OK with 30 mg Vyvanse  daily.  Ultimately it is OB decision but assuming things are the same we can send in the 30 mg dose.  I don't know of any medical reasons why 40 mg  would not be reasonable, but she needs to clear that with OB.     06/20/23 TC: re: stopped Abilify   07/31/23 appt noted:  Med:  lamotrigine  150, Vyvanse  30, sertraline  150, no Abilify , Reglan  10 AM 16 weeks.  EDC 10/10.  Pregnant with girl.  Some days feels bad physically but better than first trimester. No SE with meds and satisfied. Jayson 34 yo. 2 boys and 1 girl.  [name] doing well too.  Boys doing well.   Felicia Young doing well with other kids, samuel and [name] 6 yo.  Felicia Young 33 yo.   D special needs Asberger's.   Doing [employer] in [place].  Helped her confidence. Doing well overall.  Not dep but ADHD is harder with less med.   OB appts good.  No htn.   Stress with parents selling home.  But normal emotions.    02/26/24 appt noted: Med:  lamotrigine  150, Vyvanse  50, sertraline  150, no Abilify  Baby born 01/08/24.  D Bebe Caldron.  Next is Jayson 20 mos is ok. Baby is doing well without complications.  Unlike 2 of her others.  She got preeclampsia after birth.  Anxiety high leading up to birth.   A lot of emotions to work through after it all, but ok now. BP is ok and feeling good now.   Better with increase Vyvanse  50 with focus.  And productivity.   Not current dep.  Baby blues in first few weeks.  Finality of tubes tied is emotional.  All I ever wanted to be was a wife and I mom.  Ending the chapter of life is hard.  More likely to hit her in evening. Marriage is now a blessing. Stress with transgender B.  Past Psychiatric Medication Trials:  Lexapro, viibryd  40 poor response,  Zoloft  150 Vyvanse  30 AM Depakote, Lamotrigine ,                                                Increase the lamotrigine  and sertraline  to 150mg  each in January 2019. Was depressed at the visit in December but symptoms were resolved with the addition of Abilify  2.5 mg daily Note: Mother also with a similar pattern of mood disorder, bipolar type II.  M and B on Cymbalta.                    Review of Systems:  Review of Systems  Constitutional:  Positive for fatigue.  Gastrointestinal:  Negative for diarrhea.  Neurological:  Negative for tremors and weakness.  Psychiatric/Behavioral:  Positive for decreased concentration. Negative for agitation, behavioral problems, confusion, dysphoric mood, hallucinations, self-injury, sleep disturbance and suicidal ideas. The patient is not nervous/anxious and is not hyperactive.     Medications: I have reviewed the patient's current medications.  Current Outpatient Medications  Medication Sig Dispense Refill   Iron , Ferrous Sulfate , 325 (65 Fe) MG TABS Take 1 tablet by mouth every other day.     lamoTRIgine  (LAMICTAL ) 150 MG tablet TAKE 1 TABLET BY MOUTH EVERY DAY 90 tablet 0   [START ON 03/25/2024] lisdexamfetamine (VYVANSE ) 50 MG capsule Take 1 capsule (50 mg total) by mouth daily. 30 capsule 0    NIFEdipine  (ADALAT  CC) 30 MG 24 hr tablet Take 1 tablet (30 mg total) by mouth daily. 30 tablet 2   Prenatal Vit-Fe Fumarate-FA (PRENATAL MULTIVITAMIN) TABS tablet Take 2 tablets by mouth daily at 12 noon. Gummy     sertraline  (ZOLOFT ) 100 MG tablet Take 1.5 tablets (150 mg total) by mouth daily. 135 tablet 1   acetaminophen  (TYLENOL ) 325 MG tablet Take 2 tablets (650 mg total) by mouth every 6 (six) hours as needed for mild pain (pain score 1-3).     ibuprofen  (ADVIL ) 600 MG tablet Take 1 tablet (600 mg total) by mouth every 6 (six) hours.     lisdexamfetamine (VYVANSE ) 50 MG capsule Take 1 capsule (50 mg total) by mouth daily. 30 capsule 0   metoCLOPramide  (REGLAN ) 10 MG tablet Take 10 mg by mouth in the morning.     oxyCODONE  (OXY IR/ROXICODONE ) 5 MG immediate release tablet Take 1-2 tablets (5-10 mg total) by mouth every 4 (four) hours as needed for moderate pain (pain score 4-6). (Patient not taking: Reported on 02/26/2024) 10 tablet 0   simethicone  (MYLICON) 80 MG chewable tablet Chew 1 tablet (80 mg total) by mouth 3 (three) times daily after meals.     No current facility-administered medications for this visit.    Medication Side Effects: None, sexual SE  Allergies: No Known Allergies  Past Medical History:  Diagnosis Date   ADHD    ALLERGIC RHINITIS    seasonal   Depression    Dyslipidemia 01/11/2013   CPX 01/2013 dx - Recommend diet/aerobic exercise changes with weight reduction   Exercise-induced asthma    Generalized anxiety disorder    Migraine    PCOS (polycystic ovarian syndrome)     Family History  Problem Relation Age of Onset   Hypertension Mother    Arthritis Mother    Arthritis Father    Lymphoma Father    Alcohol abuse Maternal Grandmother    Hyperlipidemia Maternal Grandmother    Stroke Maternal Grandmother    Hyperlipidemia Maternal Grandfather    Hyperlipidemia Paternal Grandmother    Hyperlipidemia Paternal Grandfather    Hypertension Paternal  Grandfather    Mother also has bipolar disorder type II plus anxiety Social History   Socioeconomic History   Marital status: Married    Spouse name: Not on file   Number of children: Not on file   Years of education: Not on file   Highest education level: Not on file  Occupational History   Not on file  Tobacco Use  Smoking status: Former    Types: Cigarettes   Smokeless tobacco: Never  Vaping Use   Vaping status: Former   Quit date: 10/03/2021   Substances: Nicotine  Substance and Sexual Activity   Alcohol use: No   Drug use: No   Sexual activity: Not on file  Other Topics Concern   Not on file  Social History Narrative   Not on file   Social Drivers of Health   Financial Resource Strain: Not on file  Food Insecurity: No Food Insecurity (01/08/2024)   Hunger Vital Sign    Worried About Running Out of Food in the Last Year: Never true    Ran Out of Food in the Last Year: Never true  Transportation Needs: No Transportation Needs (01/08/2024)   PRAPARE - Administrator, Civil Service (Medical): No    Lack of Transportation (Non-Medical): No  Physical Activity: Not on file  Stress: Not on file  Social Connections: Not on file  Intimate Partner Violence: Not At Risk (01/08/2024)   Humiliation, Afraid, Rape, and Kick questionnaire    Fear of Current or Ex-Partner: No    Emotionally Abused: No    Physically Abused: No    Sexually Abused: No    Past Medical History, Surgical history, Social history, and Family history were reviewed and updated as appropriate.   Please see review of systems for further details on the patient's review from today.   Objective:   Physical Exam:  There were no vitals taken for this visit.  Physical Exam Constitutional:      General: She is not in acute distress. Musculoskeletal:        General: No deformity.  Neurological:     Mental Status: She is alert and oriented to person, place, and time.     Cranial Nerves: No  dysarthria.     Coordination: Coordination normal.  Psychiatric:        Attention and Perception: Perception normal. She is inattentive. She does not perceive auditory or visual hallucinations.        Mood and Affect: Mood is anxious. Mood is not depressed. Affect is not labile, blunt, tearful or inappropriate.        Speech: Speech normal.        Behavior: Behavior normal. Behavior is not agitated. Behavior is cooperative.        Thought Content: Thought content normal. Thought content is not paranoid or delusional. Thought content does not include homicidal or suicidal ideation. Thought content does not include suicidal plan.        Cognition and Memory: Cognition and memory normal.        Judgment: Judgment normal.     Comments: No manic symptoms.  Insight good.   Talkative       Lab Review:     Component Value Date/Time   NA 135 01/09/2024 0511   K 4.0 01/09/2024 0511   CL 107 01/09/2024 0511   CO2 22 01/09/2024 0511   GLUCOSE 111 (H) 01/09/2024 0511   BUN 7 01/09/2024 0511   CREATININE 0.67 01/09/2024 0511   CALCIUM 8.1 (L) 01/09/2024 0511   PROT 4.7 (L) 01/09/2024 0511   ALBUMIN 2.1 (L) 01/09/2024 0511   AST 16 01/09/2024 0511   ALT 9 01/09/2024 0511   ALKPHOS 86 01/09/2024 0511   BILITOT 0.5 01/09/2024 0511   GFRNONAA >60 01/09/2024 0511   GFRAA >90 10/22/2013 2000       Component Value Date/Time  WBC 8.7 01/09/2024 1521   RBC 3.12 (L) 01/09/2024 1521   HGB 7.9 (L) 01/09/2024 1521   HCT 25.4 (L) 01/09/2024 1521   PLT 236 01/09/2024 1521   MCV 81.4 01/09/2024 1521   MCH 25.3 (L) 01/09/2024 1521   MCHC 31.1 01/09/2024 1521   RDW 15.0 01/09/2024 1521   LYMPHSABS 1.0 01/08/2024 2249   MONOABS 0.5 01/08/2024 2249   EOSABS 0.0 01/08/2024 2249   BASOSABS 0.0 01/08/2024 2249    No results found for: POCLITH, LITHIUM   No results found for: PHENYTOIN, PHENOBARB, VALPROATE, CBMZ   .res Assessment: Plan:    Bipolar II disorder  (HCC)  Generalized anxiety disorder  Attention deficit hyperactivity disorder (ADHD), combined type - Plan: lisdexamfetamine (VYVANSE ) 50 MG capsule, lisdexamfetamine (VYVANSE ) 50 MG capsule  PTSD (post-traumatic stress disorder)   ADHD ASRS scale 18 inattentive; 31 hyperactivity= high score before meds.  30 min face to face time with patient. We discussed symptoms are well controlled at this time.  Tolerating meds well.   Disc the risk and SE.      Counseled patient regarding potential benefits, risks, and side effects of Lamictal  to include potential risk of Stevens-Johnson syndrome. Advised patient to stop taking Lamictal  and contact office immediately if rash develops and to seek urgent medical attention if rash is severe and/or spreading quickly.  Discussed potential benefits, risks, and side effects of stimulants with patient to include increased heart rate, palpitations, insomnia, increased anxiety, increased irritability, or decreased appetite.  Instructed patient to contact office if experiencing any significant tolerability issues.  Disc risk pregnancy. Disc risk mania in bipolar pts.  She wants to continue stimulant bc marked benefit  Disc dosing range and limit to max dose.  No SE. Disc the purpose and goals of a stimulant and what to expect.  Bottle feeding.  Tried breast feeding and couldn't.    Counseling 20 min:  Grief work around no longer will have new babies and normalized it.  Also grief work around [employer].  And woking on boundaries with B and how to navigate with the kids.  Hold Abilify  DT pregnancy and Reglan . Cont lamotrigine  150 mg daily Zoloft  150 mg daily.  For reactivity and depression risk.  Disc risk mood cycling.  She and mother have done well with it. Increased  Vyvanse  50 mg AM   FU 3-4 mos  Lorene Macintosh, MD, DFAPA   Please see After Visit Summary for patient specific instructions.  No future appointments.   No orders of the defined types  were placed in this encounter.      -------------------------------

## 2024-05-02 ENCOUNTER — Other Ambulatory Visit: Payer: Self-pay | Admitting: Psychiatry

## 2024-05-02 ENCOUNTER — Telehealth: Payer: Self-pay

## 2024-05-02 DIAGNOSIS — F3181 Bipolar II disorder: Secondary | ICD-10-CM

## 2024-05-02 NOTE — Telephone Encounter (Signed)
 Next appt is 05/20/24. Felicia Young is having anxiety issues. Recently had a baby and she thinks something bad is going to happen to her baby. Having trouble sleeping. She is on Vyvanse  50 mg but because of the anxiety she increased it to 60 mg.   [id number] [address], [place] - [address]    Phone: [phone]  Fax: [phone]

## 2024-05-02 NOTE — Telephone Encounter (Signed)
 Spoke with the patient. She is no longer breastfeeding and restarted Abilify  after discontinuation. She reported having some remaining medication (5 mg tablets) and is currently taking 2.5 mg once daily.

## 2024-05-02 NOTE — Telephone Encounter (Signed)
 Increasing Vyvanse  will not help her anxiety.  Ask if she's breastfeeding.  If so the best option is to increase sertraline  Zoloft  to 200 mg daily.  If is not breastfeeding we could temporarily add Abilify  back which helped before.

## 2024-05-06 NOTE — Telephone Encounter (Signed)
 Great .  Hav eher increase Abilify  back to 5 mg daily.  That should help in a week or 2.

## 2024-05-20 ENCOUNTER — Ambulatory Visit: Admitting: Psychiatry
